# Patient Record
Sex: Female | Born: 1935
Health system: Southern US, Community
[De-identification: ages and names within clinical notes are randomized; demographics above are authoritative.]

## PROBLEM LIST (undated history)

## (undated) DIAGNOSIS — M1990 Unspecified osteoarthritis, unspecified site: Secondary | ICD-10-CM

## (undated) DIAGNOSIS — F0390 Unspecified dementia without behavioral disturbance: Secondary | ICD-10-CM

## (undated) DIAGNOSIS — I10 Essential (primary) hypertension: Secondary | ICD-10-CM

## (undated) DIAGNOSIS — I4891 Unspecified atrial fibrillation: Secondary | ICD-10-CM

## (undated) HISTORY — PX: KNEE ARTHROSCOPY: SHX127

## (undated) HISTORY — PX: BACK SURGERY: SHX140

---

## 1998-05-08 ENCOUNTER — Other Ambulatory Visit: Admission: RE | Admit: 1998-05-08 | Discharge: 1998-05-08 | Payer: Self-pay | Admitting: Obstetrics and Gynecology

## 1998-10-10 ENCOUNTER — Ambulatory Visit (HOSPITAL_BASED_OUTPATIENT_CLINIC_OR_DEPARTMENT_OTHER): Admission: RE | Admit: 1998-10-10 | Discharge: 1998-10-10 | Payer: Self-pay | Admitting: Plastic Surgery

## 1999-05-02 ENCOUNTER — Other Ambulatory Visit: Admission: RE | Admit: 1999-05-02 | Discharge: 1999-05-02 | Payer: Self-pay | Admitting: Obstetrics and Gynecology

## 1999-05-15 ENCOUNTER — Encounter: Payer: Self-pay | Admitting: Obstetrics and Gynecology

## 1999-05-15 ENCOUNTER — Encounter: Admission: RE | Admit: 1999-05-15 | Discharge: 1999-05-15 | Payer: Self-pay | Admitting: Obstetrics and Gynecology

## 1999-06-24 ENCOUNTER — Encounter (INDEPENDENT_AMBULATORY_CARE_PROVIDER_SITE_OTHER): Payer: Self-pay | Admitting: Specialist

## 1999-06-24 ENCOUNTER — Other Ambulatory Visit: Admission: RE | Admit: 1999-06-24 | Discharge: 1999-06-24 | Payer: Self-pay | Admitting: Obstetrics and Gynecology

## 2000-05-17 ENCOUNTER — Encounter: Payer: Self-pay | Admitting: Obstetrics and Gynecology

## 2000-05-17 ENCOUNTER — Encounter: Admission: RE | Admit: 2000-05-17 | Discharge: 2000-05-17 | Payer: Self-pay | Admitting: Obstetrics and Gynecology

## 2000-06-04 ENCOUNTER — Other Ambulatory Visit: Admission: RE | Admit: 2000-06-04 | Discharge: 2000-06-04 | Payer: Self-pay | Admitting: Obstetrics and Gynecology

## 2001-05-17 ENCOUNTER — Encounter: Payer: Self-pay | Admitting: Obstetrics and Gynecology

## 2001-05-17 ENCOUNTER — Encounter: Admission: RE | Admit: 2001-05-17 | Discharge: 2001-05-17 | Payer: Self-pay | Admitting: Obstetrics and Gynecology

## 2002-05-24 ENCOUNTER — Encounter: Payer: Self-pay | Admitting: Obstetrics and Gynecology

## 2002-05-24 ENCOUNTER — Encounter: Admission: RE | Admit: 2002-05-24 | Discharge: 2002-05-24 | Payer: Self-pay | Admitting: Obstetrics and Gynecology

## 2002-09-26 ENCOUNTER — Other Ambulatory Visit: Admission: RE | Admit: 2002-09-26 | Discharge: 2002-09-26 | Payer: Self-pay | Admitting: Obstetrics and Gynecology

## 2003-05-21 ENCOUNTER — Encounter: Admission: RE | Admit: 2003-05-21 | Discharge: 2003-05-21 | Payer: Self-pay | Admitting: Obstetrics and Gynecology

## 2003-11-21 ENCOUNTER — Encounter: Admission: RE | Admit: 2003-11-21 | Discharge: 2003-11-21 | Payer: Self-pay | Admitting: Family Medicine

## 2003-12-27 ENCOUNTER — Encounter: Admission: RE | Admit: 2003-12-27 | Discharge: 2003-12-27 | Payer: Self-pay | Admitting: Family Medicine

## 2004-04-14 ENCOUNTER — Inpatient Hospital Stay (HOSPITAL_COMMUNITY): Admission: RE | Admit: 2004-04-14 | Discharge: 2004-04-21 | Payer: Self-pay | Admitting: Neurosurgery

## 2004-04-21 ENCOUNTER — Ambulatory Visit: Payer: Self-pay | Admitting: Physical Medicine & Rehabilitation

## 2004-04-21 ENCOUNTER — Inpatient Hospital Stay (HOSPITAL_COMMUNITY)
Admission: RE | Admit: 2004-04-21 | Discharge: 2004-04-24 | Payer: Self-pay | Admitting: Physical Medicine & Rehabilitation

## 2004-05-01 ENCOUNTER — Encounter
Admission: RE | Admit: 2004-05-01 | Discharge: 2004-07-30 | Payer: Self-pay | Admitting: Physical Medicine & Rehabilitation

## 2004-05-12 ENCOUNTER — Emergency Department (HOSPITAL_COMMUNITY): Admission: EM | Admit: 2004-05-12 | Discharge: 2004-05-12 | Payer: Self-pay | Admitting: Emergency Medicine

## 2004-06-03 ENCOUNTER — Ambulatory Visit (HOSPITAL_COMMUNITY): Admission: RE | Admit: 2004-06-03 | Discharge: 2004-06-03 | Payer: Self-pay | Admitting: Obstetrics and Gynecology

## 2004-10-06 ENCOUNTER — Other Ambulatory Visit: Admission: RE | Admit: 2004-10-06 | Discharge: 2004-10-06 | Payer: Self-pay | Admitting: Obstetrics and Gynecology

## 2005-06-18 ENCOUNTER — Ambulatory Visit (HOSPITAL_COMMUNITY): Admission: RE | Admit: 2005-06-18 | Discharge: 2005-06-18 | Payer: Self-pay | Admitting: Obstetrics and Gynecology

## 2006-06-21 ENCOUNTER — Ambulatory Visit (HOSPITAL_COMMUNITY): Admission: RE | Admit: 2006-06-21 | Discharge: 2006-06-21 | Payer: Self-pay | Admitting: Obstetrics and Gynecology

## 2006-11-23 ENCOUNTER — Encounter: Admission: RE | Admit: 2006-11-23 | Discharge: 2006-11-23 | Payer: Self-pay | Admitting: Family Medicine

## 2007-06-15 ENCOUNTER — Ambulatory Visit: Payer: Self-pay | Admitting: Vascular Surgery

## 2007-06-15 ENCOUNTER — Ambulatory Visit (HOSPITAL_COMMUNITY): Admission: RE | Admit: 2007-06-15 | Discharge: 2007-06-15 | Payer: Self-pay | Admitting: Family Medicine

## 2007-06-15 ENCOUNTER — Encounter (INDEPENDENT_AMBULATORY_CARE_PROVIDER_SITE_OTHER): Payer: Self-pay | Admitting: Family Medicine

## 2007-06-24 ENCOUNTER — Ambulatory Visit (HOSPITAL_COMMUNITY): Admission: RE | Admit: 2007-06-24 | Discharge: 2007-06-24 | Payer: Self-pay | Admitting: Obstetrics and Gynecology

## 2007-10-21 ENCOUNTER — Inpatient Hospital Stay (HOSPITAL_COMMUNITY): Admission: RE | Admit: 2007-10-21 | Discharge: 2007-10-25 | Payer: Self-pay | Admitting: Orthopedic Surgery

## 2008-06-26 ENCOUNTER — Ambulatory Visit (HOSPITAL_COMMUNITY): Admission: RE | Admit: 2008-06-26 | Discharge: 2008-06-26 | Payer: Self-pay | Admitting: Obstetrics and Gynecology

## 2009-07-10 ENCOUNTER — Ambulatory Visit (HOSPITAL_COMMUNITY): Admission: RE | Admit: 2009-07-10 | Discharge: 2009-07-10 | Payer: Self-pay | Admitting: Obstetrics and Gynecology

## 2010-01-08 ENCOUNTER — Encounter: Admission: RE | Admit: 2010-01-08 | Discharge: 2010-01-08 | Payer: Self-pay | Admitting: Family Medicine

## 2010-10-14 NOTE — Op Note (Signed)
Katherine Miranda, Katherine Miranda              ACCOUNT NO.:  [id number]   MEDICAL RECORD NO.:  [id number]          PATIENT TYPE:  INP   LOCATION:  0002                         FACILITY:  [facility]   PHYSICIAN:  Georges Lynch. Gioffre, M.D.DATE OF BIRTH:  1920-12-23   DATE OF PROCEDURE:  10/21/2007  DATE OF DISCHARGE:                               OPERATIVE REPORT   PREOPERATIVE DIAGNOSIS:  Severe degenerative arthritis of the right  knee.   POSTOPERATIVE DIAGNOSIS:  Severe degenerative arthritis of the right  knee.   OPERATION:  Right total knee arthroplasty utilizing the DePuy system.   SURGEON:  Georges Lynch. Darrelyn Hillock, M.D.   ASSISTANT:  Madlyn Frankel. Charlann Boxer, M.D.,   SIZES USED:  The size 3 right femoral component cemented.  The size of  the tibial tray was a 2.5 mm.  The insert was a 3 mm 10 mm thickness  rotating platform insert.  I used vancomycin in the cement.  All three  components were cemented.  The size of the patella was a size 35  patella.   PROCEDURE IN DETAIL:  Under general anesthesia, routine orthopedic prep  and drape of the right lower extremity was carried out.  She had 1 gram  of IV Ancef preop.  At this time her leg was exsanguinated with an  Esmarch and tourniquet was elevated to 350 mmHg.  Following that an  incision was made in the midline with the knee flexed.  At that time I  carried out a median parapatellar incision reflecting the patella  laterally.  I flexed the knee and did medial and lateral meniscectomies  and excised the anterior and posterior cruciate ligaments.  I also did a  synovectomy.  Initial drill holes were made in the intercondylar notch  with a drill.  Following that the #1 jig was then inserted.  An 11 mm  thickness was removed from the distal femur.  Then we measured the femur  prior to that to be or following that to be a size 3.  We carried out  our anterior and posterior chamfer cuts for size 3 right femoral  component.  Following that we then prepared the  tibia in the usual  fashion.  We measured the tibia to be a size 2.5 diameter.  We made our  initial drill hole in the tibia followed by our keel cut.  After that  was carried out we then cut our notch cut out of the distal femur in the  usual fashion.  We then inserted our trial components and went through  trials and we selected a 10 mm thickness tibial insert which fit quite  nicely.  We had good medial and lateral stability, good flexion and  extension.  There were no obvious large spurs present posteriorly.  Following that we prepared our patella.  I removed the appropriate  amount of bone off of the patella for resurfacing the patella.  We  measured the patella to be a size 35 mm.  Three drill holes were made in  the patella.  Following that we then removed all trial components,  thoroughly irrigated  out the knee and cemented all three components in  simultaneously.  Once the cement was hardened we looked for loose pieces  of cement and removed those.  We had an excellent fit with a 10 mm  thickness insert.  We removed our trial insert, irrigated the knee out  again and searched for cement once again, made sure there were no loose  pieces of cement present.  Following that we inserted our permanent  rotating platform, reduced the knee, took the knee through motion and it  had good stability.  We then closed the knee in layers over a Hemovac  drain.  Prior to inserting the prosthesis I neglected to mention above  we  did inject about 20 mL of one-quarter percent Marcaine with epinephrine  and Toradol into the soft tissue.  After the wound was closed sterile  Neosporin dressings were applied.  The patient left the operating room  in satisfactory condition.           ______________________________  Georges Lynch Darrelyn Hillock, M.D.     RAG/MEDQ  D:  10/21/2007  T:  10/21/2007  Job:  [id number]   cc:   Donia Guiles, M.D.  Fax: [phone]

## 2010-10-14 NOTE — H&P (Signed)
Katherine Miranda, MIRANDA              ACCOUNT NO.:  [id number]   MEDICAL RECORD NO.:  [id number]          PATIENT TYPE:  INP   LOCATION:  NA                           FACILITY:  [facility]   PHYSICIAN:  Georges Lynch. Gioffre, M.D.DATE OF BIRTH:  03/30/1921   DATE OF ADMISSION:  10/21/2007  DATE OF DISCHARGE:                              HISTORY & PHYSICAL   CHIEF COMPLAINT:  Painful loss of range of motion right knee.   HISTORY OF PRESENT ILLNESS:  The patient is an 77 year old female here  today for history and physical examination for her upcoming right total  knee arthroplasty by Dr. Darrelyn Hillock at [facility] on Oct 21, 2007.  The patient has been having progressively worsening pain with of  range of motion of the knee.  She has a difficult time ambulating.  She  is currently getting around in a wheelchair.  X-rays show that she has  severe osteoarthritis of the knee which is also a concern with  arthroscopic procedure.  She has a bone on bone medial compartment of  the knee.   ALLERGIES:  No known drug allergies but did have some nausea with  PERCOCET 10/650.   PRIMARY CARE PHYSICIAN:  Donia Guiles, M.D.   CURRENT MEDICATIONS:  1. Travatan eye drops one drop both eyes nightly.  2. Timolol eye drops one drop right eye in the A.M.  3. Fish oil 1,000 mg a day.  4. Cranberry pill 500 mg once a day.  5. Hydroxyl 25 mg once a day.  6. Vitamin D 2,000 international units once a day.  7. Co-enzyme Q10 5 mg once a day.  8. Multivitamin once a day.  9. Avalide 300/25 mg once a day.  10.Norvasc 10 mg once a day.  11.Benazepril 40 mg once a day.  12.Aspirin 325 mg once a day.   PAST MEDICAL HISTORY:  Includes:  1. Hypertension.  2. Questionable TIA, June, 2008 with a visual defect, resolved without      recurrence.  3. Glaucoma.  4. Previous cardiac murmur.  5. Severe osteoarthritis right knee, medial compartment, bone on bone.   PAST SURGICAL HISTORY:  Includes spinal stenosis  in 2005 without any  complications.   REVIEW OF SYSTEMS:  PULMONARY:  Is negative for any respiratory issues.  NEUROLOGICAL:  Only neurological issue is related to a questionable TIA  for visual defects back in June, 16109, which completely resolved and  had a negative work up.  CARDIAC:  She does have some hypertension  issues.  She has no other cardiac issues related to heart attacks,  cardiac work up or catheterizations.  No shortness of breath or chest  discomfort or irregular heart rates.  GI:  Positive for recent diarrhea  related to eating an unusual food but nothing consistent, improved with  Imodium.  GU:  Unremarkable.  ENDOCRINE:  Unremarkable.  HEMATOLOGIC:  Unremarkable.   FAMILY MEDICAL HISTORY:  Father is deceased from heart disease at the  age of 34.  Mother is deceased of unknown causes with a history of  hypertension at 28.   SOCIAL  HISTORY:  The patient is a widow.  She has never smoked.  No  alcohol or drug issues.  Three grown children.  She lives alone in a one  story house.  Family will be providing care postoperatively.   PHYSICAL EXAMINATION:  VITAL SIGNS:  Height is 5 feet 4 inches.  Weight  is 140 pounds.  Blood pressure was 142/78. Pulse of 74 and regular.  Respirations are 12 and nonlabored.  The patient is afebrile.  GENERAL:  This is a thin, frail female, conscious, alert and  appropriate.  Appears to be a good historian.  Moves around currently in  a wheelchair due to difficulty with range of motion and pain in her  right lower extremity.  HEENT:  Head was normocephalic. Pupils equal, round, reactive.  Extraocular movements intact.  Gross hearing is intact.  NECK:  Was supple.  No palpable lymphadenopathy.  Good range of motion  without any discomfort.  CHEST:  Lung sound are clear and equal bilaterally.  HEART:  Regular rate and rhythm with no murmurs.  ABDOMEN:  Soft, nontender.  Bowel sounds present.  EXTREMITIES:  Upper extremities had excellent  range of motion of  shoulders, elbows and wrists.  Motor strength was 5/5.  Lower  extremities right and left hip had full extension, flexion up to 130  degrees with 20 to 30 degrees internal and external rotation without any  discomfort.  Left knee had full extension, flexion to 130, no  instability.  No effusion.  No signs of erythema.  She had soft calf,  good motion of her left ankle.  Right knee lacked about 10 degrees of  extension.  She was able to flex it back to about 110 degrees.  She had  no effusion, no signs of infection about the knee.  Her calf was soft  and nontender.  She had good motion of her right ankle.   Peripheral vascular carotid pulses were 2+ and vigorous.  Radial pulses  were 2+.  Posterior tibial and dorsalis pedis pulses were 1+  She had  significant varicosities in the lower extremities.  NEUROLOGICAL:  Patient was conscious, alert and appropriate.  She had no  gross neurological defects noted.  BREAST/RECTAL and GENITOURINARY:  Exams were deferred at this time.   IMPRESSION:  1. End-stage osteoarthritis with loss of range of motion and painful      weight-bearing right knee.  2. Hypertension.  3. Glaucoma.  4. Questionable transient ischemic attack with visual defects in 2008.  5. Reported cardiac murmur.  6. Glaucoma.   PLAN:  The patient will undergo all routine labs and tests prior to  having a right total knee arthroplasty by Dr. Darrelyn Hillock at [facility] on Oct 21, 2007.  The patient has been medically cleared by Dr.  Donia Guiles.      Jamelle Rushing, P.A.    ______________________________  Georges Lynch Darrelyn Hillock, M.D.    RWK/MEDQ  D:  10/18/2007  T:  10/18/2007  Job:  [id number]   cc:   Donia Guiles, M.D.  Fax: [phone]

## 2010-10-17 NOTE — Consult Note (Signed)
Katherine Miranda, STELLY              ACCOUNT NO.:  [id number]   MEDICAL RECORD NO.:  [id number]          PATIENT TYPE:  INP   LOCATION:  3028                         FACILITY:  [facility]   PHYSICIAN:  Jackie Plum, M.D.DATE OF BIRTH:  Nov 27, 1920   DATE OF CONSULTATION:  04/18/2004  DATE OF DISCHARGE:                                   CONSULTATION   REQUESTING PHYSICIAN:  Dr. Cristi Loron of neurosurgery.   REASON FOR CONSULTATION:  Management of hyponatremia.   IMPRESSION:  Hyponatremia with hypokalemia with a patient recently on  diuretic.  Etiology of patient's hyponatremia may be diuretic-related.  Another possibility could be sheer pain with secondary syndrome of  inappropriate antidiuretic hormone.   RECOMMENDATIONS:  I would like to give the patient a trial of saline IV for  depletion of her potassium, monitor her sodium and potassium.  She may be  resistant to improvement with high-potassium foods, therefore hyponatremia  workup with the urine and blood studies would be instituted.   COMMENTS:  Ms. Katherine Miranda was seen, evaluated after interviewing and all her  records were reviewed.  The patient is a pleasant 77 year old lady with  history of hypertension, glaucoma and arthritis, who was admitted to the  neurosurgical service for bilateral laminectomies to treat her spinal  stenosis plus DJD.  The patient, postop, had been noted to have a drifting  down of her sodium incidentally down to levels in the 120s, whereupon her  hydrochlorothiazide was discontinued on April 16, 2004 by her attending  physician, Dr. Lovell Sheehan.  However, her sodium drifted further down and  hospitalists' service was asked to evaluate for management of her  hyponatremia.  The patient indicates that she has never had any problems  with hyponatremia for which she can remember.  She feels mildly weak without  nausea, vomiting or headaches.  No history of chest pain, palpitations or  shortness of  breath.  She denies any history of dysuria or frequency of  micturition.  The patient had been having a lot of pain after her surgery.  She is receiving oxycodone postop with p.r.n. morphine sulfate, which she  has not been calling for frequently as needed for pain control.   PAST MEDICAL HISTORY:  1.  History of glaucoma.  2.  Hypertension.  3.  DJD, as mentioned above.   SOCIAL HISTORY:  The patient does not smoke cigarettes or drink alcohol.   CURRENT MEDICATIONS:  Her current medication list in the hospital was  reviewed and her ACE inhibitor has been substituted with Avapro, and the  Norvasc has been discontinued.   PHYSICAL EXAMINATION:  VITAL SIGNS:  BP was 168/84, pulse of 83,  respirations 17.  She was afebrile.  Her room air O2 SAT was 93%.  GENERAL:  During this admission, she was not in acute cardiopulmonary  distress, however, she was in mild-to-moderate painful distress.  HEENT:  Normocephalic, atraumatic.  Pupils were equal, round and reactive to  light.  Extraocular movements were intact.  Oropharynx was moist without any  exudation or erythema.  She had a very mild conjunctival pallor without  icterus.  NECK:  Neck was supple with no JVD.  LUNGS:  Lungs were clear to auscultation.  CARDIAC:  She had a regular rate and rhythm on cardiac auscultation.  ABDOMEN:  Abdomen was soft and nontender.  EXTREMITIES:  She did not have any edema or cyanosis on extremity exam.  NEUROLOGIC:  She is alert and oriented x3 with no acute focal deficits.   LABORATORY WORK:  Her lab work was reviewed.  Her sodium was 123 with a  potassium of 3.1 and chloride of 86, CO2 30, glucose 110, BUN was 5,  creatinine 0.6, calcium 8.3.  Her previous hemoglobin was 10.7, white blood  cell count 8.1, platelet count was 224,000, MCV 81.5, hematocrit was 30.6.   ASSESSMENT:  Katherine Miranda has significant hyponatremia without any overt  systemic symptoms.  She is hemodynamically stable.  She does not  have any  significant cardiac symptomatology.  The etiology of the patient's  hyponatremia most likely is related to her diuretics with concurrent  hypokalemia, however, sheer pain could be correlated for syndrome of  inappropriate antidiuretic hormone which would result in her presentation.  We will not engage our service in a full-scale hyponatremia workup, but I  would continue to hold the diuretic and give saline infusion and monitor her  metabolic panel.  Should there be no improvement, as mentioned above, we  will then engage in large-scale workup.  Her x-ray done on April 10, 2004  was not significant for an acute infiltrate or any pulmonary process.   Thank you for this consultation.  Dr. Lendell Caprice will be following up with  you.      Geor   GO/MEDQ  D:  04/18/2004  T:  04/18/2004  Job:  [id number]   cc:   Cristi Loron, M.D.  [address]  [place]  [place] [address]  Fax: [phone]   Donia Guiles, M.D.  [address] [place]  [place] [address]  Fax: [phone]   L. Lupe Carney, M.D.  [address] [place]  [place] [address]  Fax: [phone]

## 2010-10-17 NOTE — Discharge Summary (Signed)
[name], Katherine Miranda NO.:  [id number]   MEDICAL RECORD NO.:  [id number]          PATIENT TYPE:  INP   LOCATION:  3028                         FACILITY:  [facility]   PHYSICIAN:  Cristi Loron, M.D.DATE OF BIRTH:  May 16, 1921   DATE OF ADMISSION:  04/14/2004  DATE OF DISCHARGE:  04/21/2004                                 DISCHARGE SUMMARY   BRIEF HISTORY:  The patient is an 77 year old white female who suffers from  back and leg pain consistent with neurogenic claudication.  She has failed  medical management and was worked up with a lumbar MRI which demonstrated  the patient had multilevel degenerative changes, but significantly had a  grade 1 acquired spondylolisthesis at L4-5 with resultant severe spinal  stenosis as well as stenosis at L3-4 as well. I discussed the various  treatment options with the patient and her family including surgery.  The  patient weighed the risks, benefits and alternatives of surgery and decided  to proceed with an L4-5 fusion as well as an L3-4 decompression.   For further details of this admission, please refer to the typed history and  physical.   HOSPITAL COURSE:  I admitted the patient to [facility] on April 14, 2004.  On the day of admission I performed an L3 laminectomy with L4-5  fusion.  The surgery went well.  For full details of this operation, please  refer to the typed operative note.   The patient's postoperative course was remarkable only for hyponatremia. Her  sodium progressively dropped from 133 to 129 to 126.  I therefore obtained a  hospitalist consult from Dr. Jackie Plum and he helped [place] manage her  hyponatremia.   We also asked the PT, OT and the rehab team to see the patient.  They felt  her to be appropriate for inpatient rehabilitation and by April 21, 2004  the patient was afebrile and vital signs were stable. She was eating well  and ambulating well. Her wound was healing well  without signs of infection.  She was felt to be stable to transfer to the inpatient rehab unit and was  transferred on April 21, 2004.   DISCHARGE INSTRUCTIONS:  The patient was instructed to follow-up with me  four weeks after discharge from the rehab unit.   FINAL DIAGNOSES:  L3-4 spinal stenosis and degenerative joint disease, L4-5  degenerative joint disease, spondylolisthesis, spinal stenosis, lumbago and  lumbar radiculopathy.   PROCEDURES:  L4 Gill procedure; bilateral L3 laminotomies that is to treat  the spinal stenosis at L3-4; L4-5 posterior lumbar interbody fusion;  placement of bilateral L4-5 interbody prosthesis using ([place]  cages); L4-5 posterior segmental instrumentation with Legacy type using  pedicle screws and rods; L4-5 posterolateral arthrodesis with local  morselized autograft bone and VITOSS bone scaffolding.    JDJ/MEDQ  D:  06/12/2004  T:  06/12/2004  Job:  [id number]

## 2010-10-17 NOTE — Discharge Summary (Signed)
Katherine Miranda, Katherine Miranda              ACCOUNT NO.:  [id number]   MEDICAL RECORD NO.:  [id number]          PATIENT TYPE:  IPS   LOCATION:  4010                         FACILITY:  [facility]   PHYSICIAN:  Ellwood Dense, M.D.   DATE OF BIRTH:  [birth year]   DATE OF ADMISSION:  04/21/2004  DATE OF DISCHARGE:  04/24/2004                                 DISCHARGE SUMMARY   DISCHARGE DIAGNOSES:  1.  Lumbar vertebrae-3/4 Gill procedure.  2.  Bilateral lumbar vertebrae-3 laminectomies.  3.  Lumbar vertebrae-4/5 posterior lumbar interbody fusion secondary to      lumbar stenosis and radiculopathy.  4.  History of hypertension.  5.  Urinary tract infection.  6.  Mild hyponatremia.  7.  History of glaucoma.   HISTORY OF PRESENT ILLNESS:  The patient is an 77 year old white female with  a history of hypertension, back pain radiating to lower extremities with  neurogenic claudication secondary to L3/4 stenosis and radiculopathy, and  elected to undergo an L4 Gill procedure, bilateral L3 laminotomies, L4/5  PLIF on April 14, 2004 by Dr. Cristi Loron.  Presently, no DVT  prophylaxis.   On PT report this time indicates that the patient is ambulating with minimal  guarded assist 100 feet with rolling walker, transfer with minimal guarded  assist.   Hospital course was significant for spasms, hyponatremia, anemia, and  constipation.  Once the patient was medically stable, the patient was  transferred to the [facility] Department on  April 21, 2004.   REVIEW OF SYMPTOMS:  Significant for weakness and lumbago.   PAST MEDICAL HISTORY:  1.  Significant for hypertension.  2.  Glaucoma.  3.  Heart murmur.   FAMILY HISTORY:  Noncontributory.   SOCIAL HISTORY:  The patient lives with husband in one level home in  [place], [place].  There are four steps to entry.  She was  independent prior to admission.  No tobacco.  No alcohol.  Husband can  assist.  She has three adult children.   MEDICATIONS:  1.  Fosinopril 40 mg daily.  2.  Aspirin 81 mg daily.  3.  Avalide 300/12.5 mg daily.  4.  Norvasc 10 mg daily.  5.  Timoptic and Travatan.   HOSPITAL COURSE:  Katherine Miranda was admitted to [facility] Department on April 21, 2004 for comprehensive patient  rehabilitation.  She received more than three hours of therapy daily.  Overall, Katherine Miranda progressed very well.  She states she was very well.  Demonstrated no signs of infection.  There was no significant drainage or  erythema around the incision at the time of discharge.  The pain will be  controlled initially on oxycodone.  This had to be discontinued due to  sedation.  Presently, pain is being controlled with Ultram q.6h.   The patient is discharged in modified independent level.  The patient able  to ambulate independently.  Able to perform most ADLs independently at  supervision level.  Blood pressure was in reasonably fair control while in  rehabilitation.  She remained on Avapro  300 mg and Norvasc 10 mg daily.  HCTZ was resumed on April 23, 2004 at 12.5 mg daily.  Hyponatremia was  stable with sodium level of 134.   The patient did develop a urinary tract infection while on rehabilitation.  Urine cultures were performed on April 21, 2004 which were greater than  100,000 colonies of E. coli.  The patient was started on amoxicillin 250 mg  p.o. t.i.d. for a total of seven days.  There are no major issues occurred  while on rehabilitation.   FOLLOWUP:  The patient is to follow up with her primary care Adaia Matthies due to  systolic blood pressures in the 150s while in rehabilitation.   LABORATORY DATA:  Latest labs indicated sodium of 134, potassium 3.8,  chloride 97, CO2 30, BUN 18, creatinine 0.8.  Hemoglobin 11.1, hematocrit  32.4, white blood count of 6.6, platelet count 369,000.   DISPOSITION:  PT report at the time of discharge states  the patient is able  to ambulate 250 feet with a rolling walker, modified activity, transfer sit  to stand modified independent, bed mobility modified independently.  Can  perform all ADLs with modified independently with some supervision level in  the shower.  At the time of discharge, all vitals were stable.  Physical  exam unremarkable.   The patient was discharged home with her family.   DISCHARGE MEDICATIONS:  1.  Resume Avalide home dose.  2.  Norvasc 10 mg daily.  3.  Aspirin 81 mg daily.  4.  Resume her eye drops, Timoptic and Travatan.  5.  Fosinopril 40 mg daily.  6.  Ultram q.6-8h. p.r.n. 50 to 100 mg.  7.  Amoxicillin 250 mg 1 tablet q.6h. or 3 times daily for 4 more days.  8.  Ultram and Tylenol p.r.n. back pain.   ACTIVITY:  We have gave precautions.  Use back brace for sitting and  standing.  Use walker.  No driving until follow up with Dr. Cristi Loron.   FOLLOWUP:  She had an outpatient rehabilitation at [facility] with PT and OT beginning on May 01, 2004 at 8:15.  Follow up  with Dr. Cristi Loron in two weeks and call for an appointment.  Follow up with Dr. Ellwood Dense as needed.  She will need to follow up  with Dr. Donia Guiles in three to four weeks to check the blood pressure.       LB/MEDQ  D:  04/24/2004  T:  04/24/2004  Job:  [id number]   cc:   Ellwood Dense, M.D.  [address] [place]  [place] [address]  Fax: [phone]   Cristi Loron, M.D.  [address]  [place]  [place] [address]  Fax: [phone]   Donia Guiles, M.D.  [address] [place]  [place] [address]  Fax: [phone]

## 2010-10-17 NOTE — Op Note (Signed)
NAMEADISYNN, SULEIMAN              ACCOUNT NO.:  [id number]   MEDICAL RECORD NO.:  [id number]          PATIENT TYPE:  INP   LOCATION:  3028                         FACILITY:  [facility]   PHYSICIAN:  Cristi Loron, M.D.DATE OF BIRTH:  05/10/1921   DATE OF PROCEDURE:  04/14/2004  DATE OF DISCHARGE:                                 OPERATIVE REPORT   PREOPERATIVE DIAGNOSIS:  L3-4 spinal stenosis, degenerative disk disease; L4-  5 degenerative disk disease, spondylolisthesis, spinal stenosis, lumbago and  lumbar radiculopathy.   POSTOPERATIVE DIAGNOSIS:  L3-4 spinal stenosis, degenerative disk disease;  L4-5 degenerative disk disease, spondylolisthesis, spinal stenosis, lumbago  and lumbar radiculopathy.   OPERATION PERFORMED:  L4 Gill procedure; bilateral L3 laminotomies (to treat  the spinal stenosis at L3-4); L4-5 posterior lumbar interbody fusion;  placement of bilateral L4-5 interbody prosthesis (Capstone PEAK cages); L4-5  posterior segmental instrumentation with Legacy titanium pedicle screws and  rods; L4-5 posterolateral arthrodesis with local morselized autograft bone  and Vitoss bone scaffolding.   SURGEON:  Cristi Loron, M.D.   ASSISTANT:  Stefani Dama, M.D.   ANESTHESIA:  General endotracheal.   ESTIMATED BLOOD LOSS:  250 mL.   SPECIMENS:  None.   DRAINS:  None.   COMPLICATIONS:  None.   INDICATIONS FOR PROCEDURE:  The patient is an 77 year old white female who  has suffered from back and leg pain consistent with neurogenic claudication.  She failed medical management and was worked up with a lumbar MRI which  demonstrated the patient had multilevel degenerative changes but most  significantly had a grade 1 acquired spondylolisthesis at L4-5 with  resultant severe spinal stenosis as well as __________ spinal stenosis 3-4.  I discussed the various treatment options with the patient and her family  including surgery.  The patient weighed the risks,  benefits and alternatives  to surgery and decided to proceed with an L3-4 decompression and L4-5  fusion.   DESCRIPTION OF PROCEDURE:  The patient was brought to the operating room by  the anesthesia team.  General endotracheal anesthesia was induced.  The  patient was then turned to the prone position on the Wilson frame.  Her  lumbosacral region was then prepared with Betadine scrub and Betadine  solution and sterile drapes were applied.  I then injected the area to be  incised with Marcaine with epinephrine solution.  I used a scalpel to make a  linear midline incision over the L3-4 and L4-5 interspaces.  I used  electrocautery to perform a bilateral subperiosteal dissection stripping  bilateral spinous process and lamina of L3, L4 and L5.  I inserted the  Versatrac retractor for exposure and then obtained intraoperative radiograph  to confirm our location.   We began by incising the interspinous ligament at L3-4 and L4-5.  We then  used a Leksell rongeur to remove the L4 spinous process and part of the L4  lamina.  We saved this bone and later cleared it of soft tissues and used it  in the fusion process.  We then used a high speed drill to perform a  bilateral  L4 and L3 laminotomies.  We then widened the laminotomies at L3  using Kerrison punch to remove the L3-4 ligamentum flavum completing the  decompression at this level and performing foraminotomies about the  bilateral L4 nerve roots.  We then completed the Gill procedure at L4 by  using the Kerrison punch removing the remainder of the L4 lamina, the medial  aspect of the pars region and performed foraminotomies about the bilateral  L5 nerve roots.  There was severe stenosis at this level.  This completed  the decompression.   We now turned our attention to the posterior lumbar interbody fusion.  We  carefully retracted the thecal sac and the L5 nerve root medially with the  D'Errico retractor exposing the L4-5 intervertebral  disk.  We incised the  disk with a 15 blade scalpel and performed aggressive diskectomy using  pituitary forceps.  We did this bilaterally.  We then inserted the interbody  spreader into the contralateral disk space and then distracted the L4-5  interspace and then cleared the remainder of the soft tissue from the  vertebral end plates Z6-1 on the ipsilateral side and then inserted an 8 x  22 mm Capstone PEAK cage which had been prefilled with local morcellized  autograft bone and Vitoss bone scaffolding.  We then removed the spreader  from the contralateral side, cleared the soft tissue from the vertebral end  plates at that side and placed a second 10 x 22 mm  Capstone PEAK cage on  the left side (the extent of disk space collapse was different on either  side.  We then filled in between the cages with local morcellized autograft  bone and Vitoss bone scaffolding completing the posterior lumbar interbody  fusion.   We turned our attention to the instrumentation by using electrocautery to  expose the bilateral transverse processes of L4 and L5.  Then under  fluoroscopic guidance, we cannulated the bilateral L4 and L5 pedicles,  tapped the pedicles, probed inside the tapped pedicles to rule out cortical  breaches and inserted 6-5 x 45 mm pedicle screws bilaterally at L5 and 6.5 x  50 mm pedicle screws bilaterally at L4.  We then probed along the medial  aspect of the bilateral L4 and L5 pedicles and noted that there were no  cortical breeches and that the L4 and L5 nerve roots were not injured.  We  then connected unilateral pedicle screws with a lordotic rod.  We compressed  the construct and then secured the rods in place with the caps which we  tightened appropriately, completing the instrumentation.   We now turned our attention to posterior lumbar interbody fusion.  We used the high speed drill to decorticate the remainder of the L4-5 facet joint,  the pars region, the bilateral  transverse processes of L4 and L5.  We then  placed a combination of local morcellized autograft bone and Vitoss bone  scaffolding over these decorticated posterolateral structures completing a  posterolateral arthrodesis.   We then inspected the thecal sac and bilateral L4 and L5 nerve roots and  noted that they were all well decompressed as was the thecal sac from L3  down to the top of  L5. We then obtained stringent hemostasis using bipolar  electrocautery, removed the Versatrac retractor and then reapproximated the  patient's thoracolumbar fascia with interrupted #1 Vicryl suture,  subcutaneous tissue with interrupted 2-0 Vicryl suture and the skin with  Steri-Strips and Benzoin.  The wound was then coated  with bacitracin  ointment, sterile dressing was applied, the drapes were removed.  The  patient was subsequently returned to supine position where she was extubated  by the anesthesia team and transported to the post anesthesia care unit in  stable condition.  All sponge, needle and instrument counts were correct at  the end of the case.     JDJ/MEDQ  D:  04/14/2004  T:  04/14/2004  Job:  [id number]

## 2010-10-17 NOTE — Discharge Summary (Signed)
NAMEFRANSHESKA, Katherine Miranda              ACCOUNT NO.:  [id number]   MEDICAL RECORD NO.:  [id number]          PATIENT TYPE:  INP   LOCATION:  1528                         [facility]ACILITY[facility]   PHYSICIAN:  Georges Lynch. Gioffre, M.D.DATE OF BIRTH:  07-22-1920   DATE OF ADMISSION:  10/21/2007  DATE OF DISCHARGE:  10/25/2007                               DISCHARGE SUMMARY   ADMISSION DIAGNOSES:  1. End-stage osteoarthritis right knee.  2. Hypertension.  3. Glaucoma.  4. Questionable transient ischemic attack with visual defects in 2008.  5. Reported cardiac murmur.   DISCHARGE DIAGNOSES:  1. Right total knee arthroplasty.  2. Postoperative blood loss anemia, asymptomatic, not requiring      transfusion.  3. History of hypertension.  4. History of glaucoma.  5. History of questionable transient ischemic attack with visual      defects in 2008.  6. History of cardiac murmur.   HISTORY OF PRESENT ILLNESS:  The patient is an 77 year old female with  long-term problems related to her right knee.  The patient has been  treated conservatively.  The patient failed conservative treatment with  injections.  The patient states that she has to get around with a  wheelchair due to pain and difficulty with motion of her right knee.  X-  rays show that she has bone-on-bone medial compartment.  The patient has  elected to proceed with a total knee arthroplasty.   ALLERGIES:  No known drug allergies, but nausea with Percocet 10/650.   MEDICATIONS ON ADMISSION:  1. Travatan eyedrops 1 drop both eyes nightly.  2. Timolol eyedrops 1 drop right eye in the a.m.  3. Fish oil 1000 mg a day.  4. Cranberry pill 500 mg once a day.  5. Hydroxyl 25 mg once a day.  6. Vitamin D 2000 international units once a day.  7. Coenzyme Q10 5 mg once a day.  8. Multivitamin once a day.  9. Avalide 300/25 mg once a day.  10.Norvasc 10 mg once a day.  11.Benazepril 40 mg once a day.  12.Aspirin 325 mg once a day.   SURGICAL  PROCEDURE:  On Oct 21, 2007, the patient was taken to the OR by  Dr. Worthy Rancher, assisted by Dr. Lajoyce Corners.  Under general anesthesia  the patient underwent a right total knee arthroplasty with a DePuy right  rotating platform system.  The patient had minimal blood loss.  No  complications.  The patient was transferred to the recovery room and  then to the orthopedic floor in good condition.  The patient had the  following components implanted:  A size 3 Sigma posterior stabilizing  femoral component, a size 2.5 keeled tibial tray, a size 35 mm three-peg  patella, a size 3 10-mm thickness polyethylene component.  All  components were implanted with polymethyl methacrylate with vancomycin  mixed in.   CONSULTS:  The following routine consults were requested:  Physical  therapy, case management and pharmacy.   HOSPITAL COURSE:  The patient was admitted to [facility] on  Oct 21, 2007.  The patient was taken to the  OR where a right total knee  arthroplasty was performed without any complications.  The patient  tolerated the procedure well, was transferred to the recovery room with  IV antibiotics, pain medicines, on DVT prophylaxis.  The patient did  develop some postoperative hyponatremia in which this remained  asymptomatic.  She also developed some postoperative hypokalemia.  This  also remained asymptomatic but was not treated.  The patient's right  lower extremity, the wound remained benign for any signs of infection.  Her leg remained neuromotor vascularly intact.  The patient progressed  nicely with physical therapy.  The patient was able to wean off of IV  medications well without any problems.  She did have, once again,  problems with Percocet with nausea and she was converted over to  Vicodin.  The patient continued to work well with physical therapy.  The  patient was felt to be medically stable and ready for discharge home on  outpatient physical therapy and nurse to  monitor Coumadin.   LABORATORIES:  CBC on admission found WBC 7.6, hemoglobin 12.4,  hematocrit 36.9, platelets 282.  Discharge CBC was 8.8 with a 10.2  hemoglobin, a 29.7 hematocrit, and platelets 194.  INR at discharge was  1.4.  Routine chemistries on admission found sodium of 133, potassium  4.1, glucose 123, BUN 28, creatinine 1.17.  The patient's sodium was  labile between 129 but on discharge was 130.  Potassium dropped to 3.1,  glucose of 130, BUN 8, creatinine 0.67.  The patient's urinalysis  preoperatively did show she had large hemoglobin; 30 of protein; large  leukocyte esterase; too numerous to count WBCs with rare bacteria  cultured out to greater than 100,000 colonies.  She was placed on Cipro  preoperatively as an outpatient.   DISCHARGE INSTRUCTIONS:  By Dr. Darrelyn Hillock:  1. Diet:  No restrictions.  2. Activity:  The patient may increase her activity slowly, walk with      assistance with the use of a walker.  She may shower on discharge.  3. Wound care:  The patient is to change her dressing daily.   FOLLOW UP:  She needs a follow-up appointment 2 weeks from date of  discharge.  The patient is to call [phone] for that follow-up  appointment.   MEDICATIONS UPON DISCHARGE:  1. Coumadin 5 mg once a day unless changed by pharmacist.  2. Percocet 10/650 one tablet every 4-6 hours for pain if needed.  3. Robaxin 500 mg once every 6 hours for pain and muscle spasms if      needed.  4. Timolol eyedrops 1 drop in the morning.  5. Travatan 0.5% eyedrops 1 drop each eye nightly.  6. Iron capsule once a day.  7. Avalide 300/25 mg once a day.  8. Norvasc 10 mg once a day.  9. Benazepril 40 mg once a day.  10.Multivitamin once a day.  11.Fish oil 1000 units a day.  12.Aspirin to be hold put on hold until done with Coumadin.  13.Cranberry pill once a day.  14.Hydroxyl 25 mg nightly.  15.Vitamin D 2000 international units a day.  16.Coenzyme Q10 2 tablets a day.  17.Goji  juice once a day.   The patient's condition upon discharge to home is listed as improved and  good.      Jamelle Rushing, P.A.    ______________________________  Georges Lynch Darrelyn Hillock, M.D.    RWK/MEDQ  D:  11/14/2007  T:  11/14/2007  Job:  [id number]

## 2011-02-25 LAB — PROTIME-INR
INR: 1
INR: 1.1
INR: 1.4
Prothrombin Time: 13.2
Prothrombin Time: 15.6 — ABNORMAL HIGH
Prothrombin Time: 15.8 — ABNORMAL HIGH

## 2011-02-25 LAB — CBC
HCT: 30.2 — ABNORMAL LOW
HCT: 36.9
Hemoglobin: 10.4 — ABNORMAL LOW
Hemoglobin: 12.4
MCHC: 33.6
MCHC: 34.2
MCHC: 34.2
MCV: 89.2
MCV: 89.3
MCV: 90
Platelets: 189
Platelets: 194
Platelets: 198
Platelets: 282
RBC: 4.14
RDW: 13.3
RDW: 14.2
WBC: 7.6
WBC: 8.8

## 2011-02-25 LAB — HEMOGLOBIN AND HEMATOCRIT, BLOOD: Hemoglobin: 12

## 2011-02-25 LAB — DIFFERENTIAL
Basophils Absolute: 0
Basophils Relative: 0
Eosinophils Absolute: 0.1
Eosinophils Relative: 1
Lymphocytes Relative: 12
Lymphs Abs: 0.9
Monocytes Absolute: 0.7
Monocytes Relative: 9
Neutro Abs: 5.9
Neutrophils Relative %: 78 — ABNORMAL HIGH

## 2011-02-25 LAB — APTT: aPTT: 26

## 2011-02-25 LAB — URINE CULTURE
Colony Count: >=100000
Special Requests: NEGATIVE

## 2011-02-25 LAB — COMPREHENSIVE METABOLIC PANEL
ALT: 31
AST: 29
Albumin: 3.8
Alkaline Phosphatase: 124 — ABNORMAL HIGH
BUN: 28 — ABNORMAL HIGH
CO2: 29
Calcium: 9.7
Chloride: 92 — ABNORMAL LOW
Creatinine, Ser: 1.17
GFR calc Af Amer: 53 — ABNORMAL LOW
GFR calc non Af Amer: 44 — ABNORMAL LOW
Glucose, Bld: 123 — ABNORMAL HIGH
Potassium: 4.1
Sodium: 133 — ABNORMAL LOW
Total Bilirubin: 0.8
Total Protein: 7.6

## 2011-02-25 LAB — URINALYSIS, ROUTINE W REFLEX MICROSCOPIC
Bilirubin Urine: NEGATIVE
Glucose, UA: 100 — AB
Glucose, UA: NEGATIVE
Ketones, ur: NEGATIVE
Nitrite: NEGATIVE
Protein, ur: 30 — AB
Protein, ur: 30 — AB
Specific Gravity, Urine: 1.01
Specific Gravity, Urine: 1.017
Urobilinogen, UA: 0.2
pH: 6
pH: 6.5

## 2011-02-25 LAB — BASIC METABOLIC PANEL
BUN: 10
BUN: 18
BUN: 8
CO2: 27
CO2: 32
Calcium: 8.7
Calcium: 8.7
Chloride: 89 — ABNORMAL LOW
Chloride: 90 — ABNORMAL LOW
Creatinine, Ser: 0.67
Creatinine, Ser: 0.76
Glucose, Bld: 133 — ABNORMAL HIGH
Sodium: 129 — ABNORMAL LOW

## 2011-02-25 LAB — URINE MICROSCOPIC-ADD ON

## 2011-02-25 LAB — TYPE AND SCREEN
ABO/RH(D): A POS
Antibody Screen: NEGATIVE

## 2011-02-25 LAB — ABO/RH: ABO/RH(D): A POS

## 2011-06-03 DIAGNOSIS — H4011X0 Primary open-angle glaucoma, stage unspecified: Secondary | ICD-10-CM | POA: Diagnosis not present

## 2011-06-24 DIAGNOSIS — B351 Tinea unguium: Secondary | ICD-10-CM | POA: Diagnosis not present

## 2011-06-24 DIAGNOSIS — M79609 Pain in unspecified limb: Secondary | ICD-10-CM | POA: Diagnosis not present

## 2011-07-24 DIAGNOSIS — I10 Essential (primary) hypertension: Secondary | ICD-10-CM | POA: Diagnosis not present

## 2011-07-24 DIAGNOSIS — E782 Mixed hyperlipidemia: Secondary | ICD-10-CM | POA: Diagnosis not present

## 2011-07-24 DIAGNOSIS — R413 Other amnesia: Secondary | ICD-10-CM | POA: Diagnosis not present

## 2011-08-21 DIAGNOSIS — Z79899 Other long term (current) drug therapy: Secondary | ICD-10-CM | POA: Diagnosis not present

## 2011-08-25 DIAGNOSIS — H4011X0 Primary open-angle glaucoma, stage unspecified: Secondary | ICD-10-CM | POA: Diagnosis not present

## 2011-08-25 DIAGNOSIS — H2510 Age-related nuclear cataract, unspecified eye: Secondary | ICD-10-CM | POA: Diagnosis not present

## 2011-08-25 DIAGNOSIS — H409 Unspecified glaucoma: Secondary | ICD-10-CM | POA: Diagnosis not present

## 2011-09-11 DIAGNOSIS — Z79899 Other long term (current) drug therapy: Secondary | ICD-10-CM | POA: Diagnosis not present

## 2011-09-11 DIAGNOSIS — N179 Acute kidney failure, unspecified: Secondary | ICD-10-CM | POA: Diagnosis not present

## 2011-09-18 DIAGNOSIS — N179 Acute kidney failure, unspecified: Secondary | ICD-10-CM | POA: Diagnosis not present

## 2011-09-25 DIAGNOSIS — B351 Tinea unguium: Secondary | ICD-10-CM | POA: Diagnosis not present

## 2011-09-25 DIAGNOSIS — M79609 Pain in unspecified limb: Secondary | ICD-10-CM | POA: Diagnosis not present

## 2011-10-02 DIAGNOSIS — N179 Acute kidney failure, unspecified: Secondary | ICD-10-CM | POA: Diagnosis not present

## 2011-11-02 DIAGNOSIS — H409 Unspecified glaucoma: Secondary | ICD-10-CM | POA: Diagnosis not present

## 2011-11-02 DIAGNOSIS — H4011X0 Primary open-angle glaucoma, stage unspecified: Secondary | ICD-10-CM | POA: Diagnosis not present

## 2011-11-02 DIAGNOSIS — H2510 Age-related nuclear cataract, unspecified eye: Secondary | ICD-10-CM | POA: Diagnosis not present

## 2011-12-11 DIAGNOSIS — I498 Other specified cardiac arrhythmias: Secondary | ICD-10-CM | POA: Diagnosis not present

## 2011-12-11 DIAGNOSIS — I10 Essential (primary) hypertension: Secondary | ICD-10-CM | POA: Diagnosis not present

## 2011-12-25 DIAGNOSIS — B351 Tinea unguium: Secondary | ICD-10-CM | POA: Diagnosis not present

## 2011-12-25 DIAGNOSIS — M79609 Pain in unspecified limb: Secondary | ICD-10-CM | POA: Diagnosis not present

## 2012-01-22 DIAGNOSIS — R413 Other amnesia: Secondary | ICD-10-CM | POA: Diagnosis not present

## 2012-01-22 DIAGNOSIS — E782 Mixed hyperlipidemia: Secondary | ICD-10-CM | POA: Diagnosis not present

## 2012-01-22 DIAGNOSIS — Z0000 Encounter for general adult medical examination without abnormal findings: Secondary | ICD-10-CM | POA: Diagnosis not present

## 2012-01-22 DIAGNOSIS — I10 Essential (primary) hypertension: Secondary | ICD-10-CM | POA: Diagnosis not present

## 2012-02-27 DIAGNOSIS — Z23 Encounter for immunization: Secondary | ICD-10-CM | POA: Diagnosis not present

## 2012-03-18 DIAGNOSIS — M79609 Pain in unspecified limb: Secondary | ICD-10-CM | POA: Diagnosis not present

## 2012-03-18 DIAGNOSIS — B351 Tinea unguium: Secondary | ICD-10-CM | POA: Diagnosis not present

## 2012-03-31 DIAGNOSIS — H4011X0 Primary open-angle glaucoma, stage unspecified: Secondary | ICD-10-CM | POA: Diagnosis not present

## 2012-03-31 DIAGNOSIS — H409 Unspecified glaucoma: Secondary | ICD-10-CM | POA: Diagnosis not present

## 2012-06-08 ENCOUNTER — Emergency Department (HOSPITAL_COMMUNITY)
Admission: EM | Admit: 2012-06-08 | Discharge: 2012-06-08 | Disposition: A | Discharge status: Home / Self Care | Payer: Medicare Other | Attending: Emergency Medicine | Admitting: Emergency Medicine

## 2012-06-08 ENCOUNTER — Emergency Department (HOSPITAL_COMMUNITY): Payer: Medicare Other

## 2012-06-08 ENCOUNTER — Encounter (HOSPITAL_COMMUNITY): Payer: Self-pay

## 2012-06-08 DIAGNOSIS — N390 Urinary tract infection, site not specified: Secondary | ICD-10-CM | POA: Diagnosis not present

## 2012-06-08 DIAGNOSIS — I10 Essential (primary) hypertension: Secondary | ICD-10-CM | POA: Insufficient documentation

## 2012-06-08 DIAGNOSIS — R404 Transient alteration of awareness: Secondary | ICD-10-CM | POA: Insufficient documentation

## 2012-06-08 DIAGNOSIS — R918 Other nonspecific abnormal finding of lung field: Secondary | ICD-10-CM | POA: Diagnosis not present

## 2012-06-08 DIAGNOSIS — F29 Unspecified psychosis not due to a substance or known physiological condition: Secondary | ICD-10-CM | POA: Insufficient documentation

## 2012-06-08 DIAGNOSIS — R410 Disorientation, unspecified: Secondary | ICD-10-CM

## 2012-06-08 DIAGNOSIS — R7989 Other specified abnormal findings of blood chemistry: Secondary | ICD-10-CM | POA: Diagnosis not present

## 2012-06-08 DIAGNOSIS — R55 Syncope and collapse: Secondary | ICD-10-CM | POA: Diagnosis not present

## 2012-06-08 HISTORY — DX: Essential (primary) hypertension: I10

## 2012-06-08 LAB — URINALYSIS, ROUTINE W REFLEX MICROSCOPIC
Glucose, UA: NEGATIVE mg/dL
Specific Gravity, Urine: 1.015 (ref 1.005–1.030)

## 2012-06-08 LAB — CBC WITH DIFFERENTIAL/PLATELET
Basophils Absolute: 0.1 10*3/uL (ref 0.0–0.1)
Basophils Relative: 1 % (ref 0–1)
Eosinophils Absolute: 0.1 10*3/uL (ref 0.0–0.7)
Eosinophils Relative: 2 % (ref 0–5)
HCT: 44.8 % (ref 36.0–46.0)
MCHC: 34.8 g/dL (ref 30.0–36.0)
MCV: 87.3 fL (ref 78.0–100.0)
Monocytes Absolute: 0.6 10*3/uL (ref 0.1–1.0)
Neutro Abs: 5.2 10*3/uL (ref 1.7–7.7)
RDW: 13.1 % (ref 11.5–15.5)

## 2012-06-08 LAB — COMPREHENSIVE METABOLIC PANEL
AST: 27 U/L (ref 0–37)
Albumin: 4 g/dL (ref 3.5–5.2)
Calcium: 10.2 mg/dL (ref 8.4–10.5)
Creatinine, Ser: 1.22 mg/dL — ABNORMAL HIGH (ref 0.50–1.10)
Total Protein: 7.8 g/dL (ref 6.0–8.3)

## 2012-06-08 LAB — URINE MICROSCOPIC-ADD ON

## 2012-06-08 MED ORDER — DEXTROSE 5 % IV SOLN
1.0000 g | Freq: Once | INTRAVENOUS | Status: AC
  Administered 2012-06-08: 1 g via INTRAVENOUS
  Filled 2012-06-08: qty 10

## 2012-06-08 MED ORDER — CEPHALEXIN 500 MG PO CAPS: 500.0000 mg | ORAL_CAPSULE | Freq: Four times a day (QID) | ORAL | Status: DC

## 2012-06-08 NOTE — ED Notes (Signed)
Family at bedside. 

## 2012-06-08 NOTE — ED Notes (Signed)
Pt here with [facility] for syncope.  Pt was at home in kitchen and began swaying and family assisted her to floor.  Pt asymptomatic except pt thinks it is 85.  Pt has glucose of 268 and 140/102. ?emisis prior to arrival.  Pt has EKG with ems, NSR, negative orthostatics.

## 2012-06-08 NOTE — ED Notes (Signed)
Patient transported to CT and xray 

## 2012-06-08 NOTE — ED Provider Notes (Signed)
History     CSN: [id number]  Arrival date & time 06/08/12  0901   First MD Initiated Contact with Patient 06/08/12 [phone]      Chief Complaint  Patient presents with  . Near Syncope    (Consider location/radiation/quality/duration/timing/severity/associated sxs/prior treatment) HPI Level 5 caveat due to confusion Pt from home via EMS. She reports this morning she was feeling dizzy, like room-spinning and lightheaded. Her son helped her to sit down in the kitchen. There was a brief episode of loss of consciousness but she denies any falls, headache, blurry vision, CP, SOB, palpitations, N/V/D, dysuria. Daughter at the bedside states she had similar symptoms in the past with UTI.    Past Medical History  Diagnosis Date  . Hypertension     Past Surgical History  Procedure Date  . Knee arthroscopy     No family history on file.  History  Substance Use Topics  . Smoking status: Not on file  . Smokeless tobacco: Not on file  . Alcohol Use:     OB History    Grav Para Term Preterm Abortions TAB SAB Ect Mult Living                  Review of Systems Unable to assess due to mental status.   Allergies  Review of patient's allergies indicates no known allergies.  Home Medications  No current outpatient prescriptions on file.  BP 127/68  Pulse 63  Temp 97.3 F (36.3 C) (Oral)  Resp 20  SpO2 97%  Physical Exam  Nursing note and vitals reviewed. Constitutional: She appears well-developed and well-nourished.  HENT:  Head: Normocephalic and atraumatic.  Eyes: EOM are normal. Pupils are equal, round, and reactive to light.  Neck: Normal range of motion. Neck supple.  Cardiovascular: Normal rate, normal heart sounds and intact distal pulses.   Pulmonary/Chest: Effort normal and breath sounds normal.  Abdominal: Bowel sounds are normal. She exhibits no distension. There is no tenderness.  Musculoskeletal: Normal range of motion. She exhibits no edema and no tenderness.   Neurological: She is alert. She has normal strength. She displays normal reflexes. No cranial nerve deficit or sensory deficit. Coordination normal.       Oriented to person and place, but not time or president  Skin: Skin is warm and dry. No rash noted.  Psychiatric: She has a normal mood and affect.    ED Course  Procedures (including critical care time)  Labs Reviewed  CBC WITH DIFFERENTIAL - Abnormal; Notable for the following:    RBC 5.13 (*)     Hemoglobin 15.6 (*)     All other components within normal limits  COMPREHENSIVE METABOLIC PANEL - Abnormal; Notable for the following:    Glucose, Bld 145 (*)     BUN 34 (*)     Creatinine, Ser 1.22 (*)     GFR calc non Af Amer 38 (*)     GFR calc Af Amer 44 (*)     All other components within normal limits  URINALYSIS, ROUTINE W REFLEX MICROSCOPIC - Abnormal; Notable for the following:    APPearance CLOUDY (*)     Hgb urine dipstick SMALL (*)     Protein, ur 100 (*)     Nitrite POSITIVE (*)     Leukocytes, UA LARGE (*)     All other components within normal limits  URINE MICROSCOPIC-ADD ON - Abnormal; Notable for the following:    Squamous Epithelial / LPF FEW (*)  Bacteria, UA MANY (*)     All other components within normal limits  TROPONIN I  URINE CULTURE   Dg Chest 2 View  06/08/2012  *RADIOLOGY REPORT*  Clinical Data: Syncope, confusion.  CHEST - 2 VIEW  Comparison: 10/14/2007.  Findings: Trachea is midline.  Heart size normal.  Ascending aorta is mildly prominent, as before.  Lungs are hyperinflated but otherwise clear.  No pleural fluid.  IMPRESSION: Hyperinflation without acute finding.   Original Report Authenticated By: Leanna Battles, M.D.    Ct Head Wo Contrast  06/08/2012  *RADIOLOGY REPORT*  Clinical Data: Syncope.  CT HEAD WITHOUT CONTRAST  Technique:  Contiguous axial images were obtained from the base of the skull through the vertex without contrast.  Comparison: None.  Findings: No evidence of acute infarct,  acute hemorrhage, mass lesion, mass effect or definite hydrocephalus.  Ventricular dilatation is likely in proportion to the degree of atrophy. Fairly extensive periventricular low attenuation.  A faint area of rounded high density along the inner table of the left lateral posterior fossa (image 9) may be artifactual due to beam hardening. Visualized portions of the paranasal sinuses show partial opacification of a single left ethmoid air cell.  No air fluid levels.  IMPRESSION:  1.  No acute intracranial abnormality. 2.  Ventricular dilatation is likely in portion to the degree of atrophy. Normal pressure hydrocephalus cannot be definitively excluded. 3.  Chronic microvascular white matter ischemic changes.   Original Report Authenticated By: Leanna Battles, M.D.      No diagnosis found.    MDM   Date: 06/08/2012  Rate: 60  Rhythm: normal sinus rhythm  QRS Axis: left  Intervals: normal  ST/T Wave abnormalities: normal  Conduction Disutrbances:none  Narrative Interpretation:   Old EKG Reviewed: none available  Labs and imaging reviewed. She has clear UTI. Symptoms not consistent with NPH, acute in onset, no ataxia, etc. Daughter is comfortable with the patient going home. Urine sent for culture. Will Rx Keflex.         Francesco Provencal B. Bernette Mayers, MD 06/08/12 1207

## 2012-06-08 NOTE — ED Notes (Signed)
Pt given water per Dr Bernette Mayers.

## 2012-06-08 NOTE — ED Notes (Signed)
Walked patient to the bathroom, patient did good with walking

## 2012-06-08 NOTE — ED Notes (Signed)
MD at bedside. 

## 2012-06-10 LAB — URINE CULTURE: Colony Count: 40000

## 2012-06-11 NOTE — ED Notes (Signed)
+  Urine. Patient treated with Keflex. Sensitive to same. Per protocol MD. °

## 2012-06-13 DIAGNOSIS — I10 Essential (primary) hypertension: Secondary | ICD-10-CM | POA: Diagnosis not present

## 2012-06-13 DIAGNOSIS — F05 Delirium due to known physiological condition: Secondary | ICD-10-CM | POA: Diagnosis not present

## 2012-06-13 DIAGNOSIS — N390 Urinary tract infection, site not specified: Secondary | ICD-10-CM | POA: Diagnosis not present

## 2012-06-13 DIAGNOSIS — R6889 Other general symptoms and signs: Secondary | ICD-10-CM | POA: Diagnosis not present

## 2012-06-13 DIAGNOSIS — F0390 Unspecified dementia without behavioral disturbance: Secondary | ICD-10-CM | POA: Diagnosis not present

## 2012-06-13 DIAGNOSIS — E782 Mixed hyperlipidemia: Secondary | ICD-10-CM | POA: Diagnosis not present

## 2012-07-11 DIAGNOSIS — F0390 Unspecified dementia without behavioral disturbance: Secondary | ICD-10-CM | POA: Diagnosis not present

## 2012-07-11 DIAGNOSIS — H6120 Impacted cerumen, unspecified ear: Secondary | ICD-10-CM | POA: Diagnosis not present

## 2012-07-11 DIAGNOSIS — R5381 Other malaise: Secondary | ICD-10-CM | POA: Diagnosis not present

## 2012-07-11 DIAGNOSIS — I10 Essential (primary) hypertension: Secondary | ICD-10-CM | POA: Diagnosis not present

## 2012-07-11 DIAGNOSIS — R5383 Other fatigue: Secondary | ICD-10-CM | POA: Diagnosis not present

## 2012-08-20 DIAGNOSIS — R21 Rash and other nonspecific skin eruption: Secondary | ICD-10-CM | POA: Diagnosis not present

## 2012-08-25 DIAGNOSIS — B029 Zoster without complications: Secondary | ICD-10-CM | POA: Diagnosis not present

## 2012-09-02 DIAGNOSIS — H4011X0 Primary open-angle glaucoma, stage unspecified: Secondary | ICD-10-CM | POA: Diagnosis not present

## 2012-09-02 DIAGNOSIS — H409 Unspecified glaucoma: Secondary | ICD-10-CM | POA: Diagnosis not present

## 2012-09-04 ENCOUNTER — Encounter (HOSPITAL_COMMUNITY): Payer: Self-pay | Admitting: *Deleted

## 2012-09-04 ENCOUNTER — Emergency Department (HOSPITAL_COMMUNITY)
Admission: EM | Admit: 2012-09-04 | Discharge: 2012-09-04 | Disposition: A | Discharge status: Home / Self Care | Payer: Medicare Other | Attending: Emergency Medicine | Admitting: Emergency Medicine

## 2012-09-04 DIAGNOSIS — R404 Transient alteration of awareness: Secondary | ICD-10-CM | POA: Insufficient documentation

## 2012-09-04 DIAGNOSIS — I10 Essential (primary) hypertension: Secondary | ICD-10-CM | POA: Insufficient documentation

## 2012-09-04 DIAGNOSIS — Z79899 Other long term (current) drug therapy: Secondary | ICD-10-CM | POA: Insufficient documentation

## 2012-09-04 DIAGNOSIS — N390 Urinary tract infection, site not specified: Secondary | ICD-10-CM | POA: Insufficient documentation

## 2012-09-04 DIAGNOSIS — R5381 Other malaise: Secondary | ICD-10-CM | POA: Diagnosis not present

## 2012-09-04 DIAGNOSIS — F0390 Unspecified dementia without behavioral disturbance: Secondary | ICD-10-CM | POA: Insufficient documentation

## 2012-09-04 DIAGNOSIS — Z7982 Long term (current) use of aspirin: Secondary | ICD-10-CM | POA: Diagnosis not present

## 2012-09-04 DIAGNOSIS — R55 Syncope and collapse: Secondary | ICD-10-CM | POA: Diagnosis not present

## 2012-09-04 LAB — POCT I-STAT TROPONIN I

## 2012-09-04 LAB — CBC WITH DIFFERENTIAL/PLATELET
Basophils Absolute: 0.1 10*3/uL (ref 0.0–0.1)
Eosinophils Absolute: 0.1 10*3/uL (ref 0.0–0.7)
Eosinophils Relative: 1 % (ref 0–5)
HCT: 37.8 % (ref 36.0–46.0)
Lymphs Abs: 1 10*3/uL (ref 0.7–4.0)
MCH: 31.5 pg (ref 26.0–34.0)
MCV: 85.7 fL (ref 78.0–100.0)
Monocytes Absolute: 0.7 10*3/uL (ref 0.1–1.0)
Platelets: 225 10*3/uL (ref 150–400)
RDW: 14.8 % (ref 11.5–15.5)

## 2012-09-04 LAB — URINE MICROSCOPIC-ADD ON

## 2012-09-04 LAB — URINALYSIS, ROUTINE W REFLEX MICROSCOPIC
Bilirubin Urine: NEGATIVE
Glucose, UA: NEGATIVE mg/dL
Hgb urine dipstick: NEGATIVE
Protein, ur: NEGATIVE mg/dL
Urobilinogen, UA: 0.2 mg/dL (ref 0.0–1.0)

## 2012-09-04 LAB — COMPREHENSIVE METABOLIC PANEL
ALT: 15 U/L (ref 0–35)
Calcium: 9.2 mg/dL (ref 8.4–10.5)
Creatinine, Ser: 1.08 mg/dL (ref 0.50–1.10)
GFR calc Af Amer: 51 mL/min — ABNORMAL LOW (ref >90)
GFR calc non Af Amer: 44 mL/min — ABNORMAL LOW (ref >90)
Glucose, Bld: 95 mg/dL (ref 70–99)
Sodium: 133 mEq/L — ABNORMAL LOW (ref 135–145)
Total Protein: 7.2 g/dL (ref 6.0–8.3)

## 2012-09-04 MED ORDER — NITROFURANTOIN MONOHYD MACRO 100 MG PO CAPS: 100.0000 mg | ORAL_CAPSULE | Freq: Two times a day (BID) | ORAL | Status: DC

## 2012-09-04 NOTE — ED Notes (Signed)
Discharge instructions reviewed with pt and daughter. Both verbalized understanding.

## 2012-09-04 NOTE — ED Notes (Signed)
Pt arrived by [facility]. Pt was sitting in church approx 0930. Family saw pt put her head down and had period of being "unresponsive" possible syncopal episode.

## 2012-09-04 NOTE — ED Notes (Signed)
Pt ambulated to bathroom with minimal assist 

## 2012-09-05 NOTE — ED Provider Notes (Signed)
History     CSN: [id number]  Arrival date & time 09/04/12  1025   First MD Initiated Contact with Patient 09/04/12 1149      Chief Complaint  Patient presents with  . Near Syncope    (Consider location/radiation/quality/duration/timing/severity/associated sxs/prior treatment) HPI Comments: Pt is an 79 year old woman who was in church.  She slumped over a little bit and was unconscious for 30-40 seconds.  There were no convulsive movements.  She had no difficulty with her speech afterwards, and there was no paralysis noted.  Her daughter, who was with her, said that she was too weak to stand after the episode, and she was taken out of church in a wheel chair, where she waited for EMS to take her to [facility] ED for evaluation.  She has had shingles affecting her right leg about 2 weeks ago.  She had a similar syncopal episode in January 2014.  Patient is a 78 y.o. female presenting with syncope. The history is provided by a relative. No language interpreter was used.  Loss of Consciousness  This is a new problem. The current episode started less than 1 hour ago. Episode frequency: Briefly unconscious, for 30-40 seconds. The problem has been gradually improving. She lost consciousness for a period of less than one minute. Associated with: [employer]. Associated symptoms include weakness. Pertinent negatives include bladder incontinence, bowel incontinence, chest pain, confusion, seizures and slurred speech. She has tried nothing (Pt transported to [facility] ED via EMS.) for the symptoms. Her past medical history is significant for HTN. Past medical history comments: Dementia.    Past Medical History  Diagnosis Date  . Hypertension     Past Surgical History  Procedure Laterality Date  . Knee arthroscopy      History reviewed. No pertinent family history.  History  Substance Use Topics  . Smoking status: Not on file  . Smokeless tobacco: Not on file  . Alcohol Use: No     OB History   Grav Para Term Preterm Abortions TAB SAB Ect Mult Living                  Review of Systems  Unable to perform ROS: Dementia  Cardiovascular: Positive for syncope. Negative for chest pain.  Gastrointestinal: Negative for bowel incontinence.  Genitourinary: Negative for bladder incontinence.  Neurological: Positive for weakness. Negative for seizures.  Psychiatric/Behavioral: Negative for confusion.    Allergies  Review of patient's allergies indicates no known allergies.  Home Medications   Current Outpatient Rx  Name  Route  Sig  Dispense  Refill  . alendronate (FOSAMAX) 70 MG tablet   Oral   Take 70 mg by mouth every 7 (seven) days. Take with a full glass of water on an empty stomach.  On wednesdays         . amLODipine (NORVASC) 5 MG tablet   Oral   Take 5 mg by mouth daily.         . Ascorbic Acid (VITAMIN C PO)   Oral   Take 1 tablet by mouth daily.         Marland Kitchen aspirin 325 MG tablet   Oral   Take 325 mg by mouth at bedtime.         Marland Kitchen CALCIUM PO   Oral   Take 2 tablets by mouth daily.         . cetirizine (ZYRTEC) 10 MG tablet   Oral   Take 10  mg by mouth at bedtime.         . Cholecalciferol (VITAMIN D-3 PO)   Oral   Take 1 tablet by mouth 2 (two) times daily.         Marland Kitchen donepezil (ARICEPT) 10 MG tablet   Oral   Take 10 mg by mouth at bedtime.         . hydrochlorothiazide (HYDRODIURIL) 25 MG tablet   Oral   Take 25 mg by mouth daily.         . iron polysaccharides (NIFEREX) 150 MG capsule   Oral   Take 150 mg by mouth daily.         . Omega-3 Fatty Acids (FISH OIL PO)   Oral   Take 1 capsule by mouth 2 (two) times daily.         . timolol (BETIMOL) 0.5 % ophthalmic solution   Both Eyes   Place 1 drop into both eyes daily.          . travoprost, benzalkonium, (TRAVATAN) 0.004 % ophthalmic solution   Both Eyes   Place 1 drop into both eyes at bedtime.         . nitrofurantoin,  macrocrystal-monohydrate, (MACROBID) 100 MG capsule   Oral   Take 1 capsule (100 mg total) by mouth 2 (two) times daily.   14 capsule   0     BP 159/67  Pulse 55  Temp(Src) 97.7 F (36.5 C) (Oral)  Resp 13  SpO2 99%  Physical Exam  Nursing note and vitals reviewed. Constitutional:  Pleasant elderly woman, no distress.  Says, "I'm all right."  HENT:  Head: Normocephalic and atraumatic.  Right Ear: External ear normal.  Left Ear: External ear normal.  Mouth/Throat: Oropharynx is clear and moist.  Eyes: Conjunctivae and EOM are normal. Pupils are equal, round, and reactive to light.  Neck: Normal range of motion. Neck supple.  No caroltid bruit.  Cardiovascular: Normal rate, regular rhythm and normal heart sounds.   Pulmonary/Chest: Effort normal and breath sounds normal.  Abdominal: Soft.  Musculoskeletal: Normal range of motion. She exhibits no edema and no tenderness.  Neurological: She is alert.  No sensory or motor deficit.  Skin: Skin is warm and dry.  Has hyperpigmented macules on left thigh, residua of herpes zoster.  Psychiatric: She has a normal mood and affect. Her behavior is normal.    ED Course  Procedures (including critical care time)   Date: 09/04/2012  Rate:57  Rhythm: normal sinus rhythm  QRS Axis: left  Intervals: normal QRS:  Poor R wave progression in precordial leads suggests old anterior myocardial infarction.  ST/T Wave abnormalities: normal  Conduction Disutrbances:none  Narrative Interpretation: Abnormal EKG  Old EKG Reviewed: unchanged    Results for orders placed during the hospital encounter of 09/04/12  URINALYSIS, ROUTINE W REFLEX MICROSCOPIC      Result Value Range   Color, Urine YELLOW  YELLOW   APPearance CLOUDY (*) CLEAR   Specific Gravity, Urine 1.014  1.005 - 1.030   pH 8.0  5.0 - 8.0   Glucose, UA NEGATIVE  NEGATIVE mg/dL   Hgb urine dipstick NEGATIVE  NEGATIVE   Bilirubin Urine NEGATIVE  NEGATIVE   Ketones, ur  NEGATIVE  NEGATIVE mg/dL   Protein, ur NEGATIVE  NEGATIVE mg/dL   Urobilinogen, UA 0.2  0.0 - 1.0 mg/dL   Nitrite NEGATIVE  NEGATIVE   Leukocytes, UA MODERATE (*) NEGATIVE  CBC WITH DIFFERENTIAL  Result Value Range   WBC 7.1  4.0 - 10.5 K/uL   RBC 4.41  3.87 - 5.11 MIL/uL   Hemoglobin 13.9  12.0 - 15.0 g/dL   HCT 16.1  09.6 - 04.5 %   MCV 85.7  78.0 - 100.0 fL   MCH 31.5  26.0 - 34.0 pg   MCHC 36.8 (*) 30.0 - 36.0 g/dL   RDW 40.9  81.1 - 91.4 %   Platelets 225  150 - 400 K/uL   Neutrophils Relative 74  43 - 77 %   Neutro Abs 5.3  1.7 - 7.7 K/uL   Lymphocytes Relative 15  12 - 46 %   Lymphs Abs 1.0  0.7 - 4.0 K/uL   Monocytes Relative 9  3 - 12 %   Monocytes Absolute 0.7  0.1 - 1.0 K/uL   Eosinophils Relative 1  0 - 5 %   Eosinophils Absolute 0.1  0.0 - 0.7 K/uL   Basophils Relative 1  0 - 1 %   Basophils Absolute 0.1  0.0 - 0.1 K/uL  COMPREHENSIVE METABOLIC PANEL      Result Value Range   Sodium 133 (*) 135 - 145 mEq/L   Potassium 4.0  3.5 - 5.1 mEq/L   Chloride 94 (*) 96 - 112 mEq/L   CO2 30  19 - 32 mEq/L   Glucose, Bld 95  70 - 99 mg/dL   BUN 27 (*) 6 - 23 mg/dL   Creatinine, Ser 7.82  0.50 - 1.10 mg/dL   Calcium 9.2  8.4 - 95.6 mg/dL   Total Protein 7.2  6.0 - 8.3 g/dL   Albumin 3.6  3.5 - 5.2 g/dL   AST 23  0 - 37 U/L   ALT 15  0 - 35 U/L   Alkaline Phosphatase 64  39 - 117 U/L   Total Bilirubin 0.5  0.3 - 1.2 mg/dL   GFR calc non Af Amer 44 (*) >90 mL/min   GFR calc Af Amer 51 (*) >90 mL/min  URINE MICROSCOPIC-ADD ON      Result Value Range   Squamous Epithelial / LPF FEW (*) RARE   WBC, UA 21-50  <3 WBC/hpf   Bacteria, UA RARE  RARE  POCT I-STAT TROPONIN I      Result Value Range   Troponin i, poc 0.02  0.00 - 0.08 ng/mL   Comment 3            Lab workup showed a UTI.  Pt's daughter said that pt has had a recurrent problem with UTI.  Will Rx with Macrobid, advised followup with her PCP.      1. Syncope   2. UTI (lower urinary tract infection)             Carleene Cooper III, MD 09/05/12 1208

## 2012-09-15 DIAGNOSIS — R350 Frequency of micturition: Secondary | ICD-10-CM | POA: Diagnosis not present

## 2012-09-15 DIAGNOSIS — B0229 Other postherpetic nervous system involvement: Secondary | ICD-10-CM | POA: Diagnosis not present

## 2012-09-15 DIAGNOSIS — R4182 Altered mental status, unspecified: Secondary | ICD-10-CM | POA: Diagnosis not present

## 2012-09-15 DIAGNOSIS — M6281 Muscle weakness (generalized): Secondary | ICD-10-CM | POA: Diagnosis not present

## 2012-09-24 DIAGNOSIS — F0390 Unspecified dementia without behavioral disturbance: Secondary | ICD-10-CM | POA: Diagnosis not present

## 2012-09-24 DIAGNOSIS — M6281 Muscle weakness (generalized): Secondary | ICD-10-CM | POA: Diagnosis not present

## 2012-09-24 DIAGNOSIS — I10 Essential (primary) hypertension: Secondary | ICD-10-CM | POA: Diagnosis not present

## 2012-09-24 DIAGNOSIS — R269 Unspecified abnormalities of gait and mobility: Secondary | ICD-10-CM | POA: Diagnosis not present

## 2012-09-24 DIAGNOSIS — D649 Anemia, unspecified: Secondary | ICD-10-CM | POA: Diagnosis not present

## 2012-09-24 DIAGNOSIS — B0229 Other postherpetic nervous system involvement: Secondary | ICD-10-CM | POA: Diagnosis not present

## 2012-10-06 ENCOUNTER — Other Ambulatory Visit: Payer: Self-pay | Admitting: Family Medicine

## 2012-10-06 ENCOUNTER — Other Ambulatory Visit: Payer: Self-pay

## 2012-10-06 ENCOUNTER — Inpatient Hospital Stay (HOSPITAL_COMMUNITY)
Admission: EM | Admit: 2012-10-06 | Discharge: 2012-10-10 | DRG: 690 | Disposition: A | Discharge status: Skilled Nursing Facility | Payer: Medicare Other | Attending: Internal Medicine | Admitting: Internal Medicine

## 2012-10-06 ENCOUNTER — Encounter (HOSPITAL_COMMUNITY): Payer: Self-pay | Admitting: Emergency Medicine

## 2012-10-06 ENCOUNTER — Emergency Department (HOSPITAL_COMMUNITY): Payer: Medicare Other

## 2012-10-06 ENCOUNTER — Ambulatory Visit
Admission: RE | Admit: 2012-10-06 | Discharge: 2012-10-06 | Disposition: A | Discharge status: Home / Self Care | Payer: Medicare Other | Source: Ambulatory Visit | Attending: Family Medicine | Admitting: Family Medicine

## 2012-10-06 DIAGNOSIS — I10 Essential (primary) hypertension: Secondary | ICD-10-CM

## 2012-10-06 DIAGNOSIS — R209 Unspecified disturbances of skin sensation: Secondary | ICD-10-CM | POA: Diagnosis not present

## 2012-10-06 DIAGNOSIS — M6281 Muscle weakness (generalized): Secondary | ICD-10-CM | POA: Diagnosis not present

## 2012-10-06 DIAGNOSIS — E871 Hypo-osmolality and hyponatremia: Secondary | ICD-10-CM

## 2012-10-06 DIAGNOSIS — R269 Unspecified abnormalities of gait and mobility: Secondary | ICD-10-CM | POA: Diagnosis not present

## 2012-10-06 DIAGNOSIS — S298XXA Other specified injuries of thorax, initial encounter: Secondary | ICD-10-CM | POA: Diagnosis not present

## 2012-10-06 DIAGNOSIS — F0390 Unspecified dementia without behavioral disturbance: Secondary | ICD-10-CM | POA: Diagnosis not present

## 2012-10-06 DIAGNOSIS — E876 Hypokalemia: Secondary | ICD-10-CM | POA: Diagnosis present

## 2012-10-06 DIAGNOSIS — D181 Lymphangioma, any site: Secondary | ICD-10-CM | POA: Diagnosis not present

## 2012-10-06 DIAGNOSIS — I6203 Nontraumatic chronic subdural hemorrhage: Secondary | ICD-10-CM

## 2012-10-06 DIAGNOSIS — N390 Urinary tract infection, site not specified: Principal | ICD-10-CM

## 2012-10-06 DIAGNOSIS — W010XXA Fall on same level from slipping, tripping and stumbling without subsequent striking against object, initial encounter: Secondary | ICD-10-CM | POA: Diagnosis present

## 2012-10-06 DIAGNOSIS — I6200 Nontraumatic subdural hemorrhage, unspecified: Secondary | ICD-10-CM | POA: Diagnosis not present

## 2012-10-06 DIAGNOSIS — Z9181 History of falling: Secondary | ICD-10-CM

## 2012-10-06 DIAGNOSIS — T502X5A Adverse effect of carbonic-anhydrase inhibitors, benzothiadiazides and other diuretics, initial encounter: Secondary | ICD-10-CM | POA: Diagnosis present

## 2012-10-06 DIAGNOSIS — E785 Hyperlipidemia, unspecified: Secondary | ICD-10-CM

## 2012-10-06 DIAGNOSIS — G9608 Other cranial cerebrospinal fluid leak: Secondary | ICD-10-CM

## 2012-10-06 DIAGNOSIS — R296 Repeated falls: Secondary | ICD-10-CM

## 2012-10-06 DIAGNOSIS — R279 Unspecified lack of coordination: Secondary | ICD-10-CM | POA: Diagnosis not present

## 2012-10-06 DIAGNOSIS — W19XXXA Unspecified fall, initial encounter: Secondary | ICD-10-CM

## 2012-10-06 HISTORY — DX: Unspecified dementia, unspecified severity, without behavioral disturbance, psychotic disturbance, mood disturbance, and anxiety: F03.90

## 2012-10-06 HISTORY — DX: Unspecified osteoarthritis, unspecified site: M19.90

## 2012-10-06 LAB — COMPREHENSIVE METABOLIC PANEL
ALT: 38 U/L — ABNORMAL HIGH (ref 0–35)
Alkaline Phosphatase: 93 U/L (ref 39–117)
BUN: 19 mg/dL (ref 6–23)
CO2: 32 mEq/L (ref 19–32)
Chloride: 84 mEq/L — ABNORMAL LOW (ref 96–112)
GFR calc Af Amer: 57 mL/min — ABNORMAL LOW (ref >90)
GFR calc non Af Amer: 49 mL/min — ABNORMAL LOW (ref >90)
Glucose, Bld: 149 mg/dL — ABNORMAL HIGH (ref 70–99)
Potassium: 3.7 mEq/L (ref 3.5–5.1)
Sodium: 127 mEq/L — ABNORMAL LOW (ref 135–145)
Total Bilirubin: 0.6 mg/dL (ref 0.3–1.2)
Total Protein: 7.4 g/dL (ref 6.0–8.3)

## 2012-10-06 LAB — POCT I-STAT TROPONIN I: Troponin i, poc: 0.03 ng/mL (ref 0.00–0.08)

## 2012-10-06 LAB — CBC WITH DIFFERENTIAL/PLATELET
Basophils Absolute: 0 10*3/uL (ref 0.0–0.1)
Basophils Relative: 0 % (ref 0–1)
Eosinophils Absolute: 0.1 10*3/uL (ref 0.0–0.7)
Eosinophils Absolute: 0.1 10*3/uL (ref 0.0–0.7)
Eosinophils Relative: 1 % (ref 0–5)
Hemoglobin: 11.8 g/dL — ABNORMAL LOW (ref 12.0–15.0)
Lymphs Abs: 1.3 10*3/uL (ref 0.7–4.0)
MCH: 30.7 pg (ref 26.0–34.0)
MCHC: 36.2 g/dL — ABNORMAL HIGH (ref 30.0–36.0)
MCV: 84.9 fL (ref 78.0–100.0)
Monocytes Relative: 9 % (ref 3–12)
Neutro Abs: 6.4 10*3/uL (ref 1.7–7.7)
Neutrophils Relative %: 75 % (ref 43–77)
Neutrophils Relative %: 82 % — ABNORMAL HIGH (ref 43–77)
Platelets: 240 10*3/uL (ref 150–400)
Platelets: 275 10*3/uL (ref 150–400)
RBC: 3.85 MIL/uL — ABNORMAL LOW (ref 3.87–5.11)
RBC: 4.49 MIL/uL (ref 3.87–5.11)
RDW: 14.4 % (ref 11.5–15.5)
WBC: 8.5 10*3/uL (ref 4.0–10.5)

## 2012-10-06 MED ORDER — AMLODIPINE BESYLATE 5 MG PO TABS
5.0000 mg | ORAL_TABLET | Freq: Once | ORAL | Status: DC
  Filled 2012-10-06: qty 1

## 2012-10-06 MED ORDER — TIMOLOL HEMIHYDRATE 0.5 % OP SOLN
1.0000 [drp] | Freq: Every day | OPHTHALMIC | Status: DC
  Filled 2012-10-06 (×9): qty 0.1

## 2012-10-06 MED ORDER — IOHEXOL 300 MG/ML  SOLN
75.0000 mL | Freq: Once | INTRAMUSCULAR | Status: AC | PRN
  Administered 2012-10-06: 75 mL via INTRAVENOUS

## 2012-10-06 MED ORDER — TIMOLOL MALEATE 0.5 % OP SOLN: 1.0000 [drp] | Freq: Every day | OPHTHALMIC | Status: DC

## 2012-10-06 MED ORDER — ALENDRONATE SODIUM 70 MG PO TABS: 70.0000 mg | ORAL_TABLET | ORAL | Status: DC

## 2012-10-06 MED ORDER — ONDANSETRON HCL 4 MG PO TABS: 4.0000 mg | ORAL_TABLET | Freq: Four times a day (QID) | ORAL | Status: DC | PRN

## 2012-10-06 MED ORDER — HYDROCHLOROTHIAZIDE 25 MG PO TABS: 25.0000 mg | ORAL_TABLET | Freq: Every day | ORAL | Status: DC

## 2012-10-06 MED ORDER — SODIUM CHLORIDE 0.9 % IV SOLN
INTRAVENOUS | Status: DC
  Administered 2012-10-07: 1000 mL via INTRAVENOUS
  Administered 2012-10-07: 800 mL via INTRAVENOUS
  Administered 2012-10-08: 50 mL/h via INTRAVENOUS

## 2012-10-06 MED ORDER — ACETAMINOPHEN 650 MG RE SUPP: 650.0000 mg | Freq: Four times a day (QID) | RECTAL | Status: DC | PRN

## 2012-10-06 MED ORDER — SODIUM CHLORIDE 0.9 % IJ SOLN
3.0000 mL | Freq: Two times a day (BID) | INTRAMUSCULAR | Status: DC
  Administered 2012-10-06 – 2012-10-10 (×5): 3 mL via INTRAVENOUS

## 2012-10-06 MED ORDER — ACETAMINOPHEN 325 MG PO TABS: 650.0000 mg | ORAL_TABLET | Freq: Four times a day (QID) | ORAL | Status: DC | PRN

## 2012-10-06 MED ORDER — HYDROCHLOROTHIAZIDE 25 MG PO TABS
25.0000 mg | ORAL_TABLET | Freq: Every day | ORAL | Status: DC
  Filled 2012-10-06: qty 1

## 2012-10-06 MED ORDER — POLYSACCHARIDE IRON COMPLEX 150 MG PO CAPS
150.0000 mg | ORAL_CAPSULE | Freq: Every day | ORAL | Status: DC
  Administered 2012-10-07 – 2012-10-10 (×4): 150 mg via ORAL
  Filled 2012-10-06 (×4): qty 1

## 2012-10-06 MED ORDER — ASPIRIN 325 MG PO TABS
325.0000 mg | ORAL_TABLET | Freq: Every day | ORAL | Status: DC
  Administered 2012-10-06 – 2012-10-09 (×4): 325 mg via ORAL
  Filled 2012-10-06 (×5): qty 1

## 2012-10-06 MED ORDER — SODIUM CHLORIDE 0.9 % IV SOLN
INTRAVENOUS | Status: AC
  Administered 2012-10-06: via INTRAVENOUS

## 2012-10-06 MED ORDER — DONEPEZIL HCL 10 MG PO TABS: 10.0000 mg | ORAL_TABLET | Freq: Every day | ORAL | Status: DC

## 2012-10-06 MED ORDER — OMEGA-3-ACID ETHYL ESTERS 1 G PO CAPS
1.0000 g | ORAL_CAPSULE | Freq: Every day | ORAL | Status: DC
  Administered 2012-10-07 – 2012-10-10 (×4): 1 g via ORAL
  Filled 2012-10-06 (×4): qty 1

## 2012-10-06 MED ORDER — HYDROCODONE-ACETAMINOPHEN 5-325 MG PO TABS: 1.0000 | ORAL_TABLET | ORAL | Status: DC | PRN

## 2012-10-06 MED ORDER — AMLODIPINE BESYLATE 5 MG PO TABS
5.0000 mg | ORAL_TABLET | Freq: Every day | ORAL | Status: DC
  Administered 2012-10-06: 5 mg via ORAL
  Filled 2012-10-06 (×2): qty 1

## 2012-10-06 MED ORDER — DONEPEZIL HCL 10 MG PO TABS
10.0000 mg | ORAL_TABLET | Freq: Every day | ORAL | Status: DC
  Administered 2012-10-06 – 2012-10-09 (×4): 10 mg via ORAL
  Filled 2012-10-06 (×5): qty 1

## 2012-10-06 MED ORDER — ASPIRIN 81 MG PO CHEW: 324.0000 mg | CHEWABLE_TABLET | Freq: Every day | ORAL | Status: DC

## 2012-10-06 MED ORDER — LORATADINE 10 MG PO TABS
10.0000 mg | ORAL_TABLET | Freq: Every day | ORAL | Status: DC
  Administered 2012-10-07 – 2012-10-10 (×4): 10 mg via ORAL
  Filled 2012-10-06 (×4): qty 1

## 2012-10-06 MED ORDER — TRAVOPROST (BAK FREE) 0.004 % OP SOLN
1.0000 [drp] | Freq: Every day | OPHTHALMIC | Status: DC
  Administered 2012-10-06 – 2012-10-09 (×4): 1 [drp] via OPHTHALMIC
  Filled 2012-10-06: qty 2.5

## 2012-10-06 MED ORDER — TIMOLOL MALEATE 0.5 % OP SOLN
1.0000 [drp] | Freq: Every day | OPHTHALMIC | Status: DC
  Administered 2012-10-07 – 2012-10-10 (×4): 1 [drp] via OPHTHALMIC
  Filled 2012-10-06: qty 5

## 2012-10-06 MED ORDER — ONDANSETRON HCL 4 MG/2ML IJ SOLN: 4.0000 mg | Freq: Four times a day (QID) | INTRAMUSCULAR | Status: DC | PRN

## 2012-10-06 MED ORDER — MORPHINE SULFATE 2 MG/ML IJ SOLN: 1.0000 mg | INTRAMUSCULAR | Status: DC | PRN

## 2012-10-06 MED ORDER — TRAVOPROST (BAK FREE) 0.004 % OP SOLN: 1.0000 [drp] | Freq: Every day | OPHTHALMIC | Status: DC

## 2012-10-06 NOTE — ED Notes (Signed)
Pt is xray. Family member waiting in room for patient

## 2012-10-06 NOTE — ED Provider Notes (Signed)
History     CSN: [id number]  Arrival date & time 10/06/12  1657   First MD Initiated Contact with Patient 10/06/12 1834      Chief Complaint  Patient presents with  . Abnormal Lab    (Consider location/radiation/quality/duration/timing/severity/associated sxs/prior treatment) HPI Comments: The patient is an 79 year old woman who has had multiple rales. She's had 3 falls in the past week. She has suffered large bruises, on her neck, left arm, and both shins. She had been seen by her family physician, Cam Hai M.D., who ordered lab tests and CT x-ray of her brain. The lab tests showed a sodium of 124, and this CT x-ray the brain showed a subdural hygroma. She therefore was advised to come to [facility] for evaluation.  Patient is a 79 y.o. female presenting with fall. The history is provided by medical records (The daughter). No language interpreter was used.  Fall Fall occurred: Three falls in the past week. She fell from a height of 1 to 2 ft. She landed on a hard floor. Point of impact: Neck, left arm, both shins. Pain location: She does not complain of pain. The pain is at a severity of 0/10. The patient is experiencing no pain. She was ambulatory at the scene. There was no entrapment after the fall. There was no drug use involved in the accident. There was no alcohol use involved in the accident. Associated symptoms include headaches. Exacerbated by: Nothing. Prehospitalization: Pt not seen by EMS.    Past Medical History  Diagnosis Date  . Hypertension   . Dementia     Past Surgical History  Procedure Laterality Date  . Knee arthroscopy      No family history on file.  History  Substance Use Topics  . Smoking status: Not on file  . Smokeless tobacco: Not on file  . Alcohol Use: No    OB History   Grav Para Term Preterm Abortions TAB SAB Ect Mult Living                  Review of Systems  Unable to perform ROS: Dementia  Neurological: Positive for  headaches.    Allergies  Review of patient's allergies indicates no known allergies.  Home Medications   Current Outpatient Rx  Name  Route  Sig  Dispense  Refill  . alendronate (FOSAMAX) 70 MG tablet   Oral   Take 70 mg by mouth every 7 (seven) days. Take with a full glass of water on an empty stomach.  On wednesdays         . amLODipine (NORVASC) 5 MG tablet   Oral   Take 5 mg by mouth at bedtime.          . Ascorbic Acid (VITAMIN C PO)   Oral   Take 1 tablet by mouth 2 (two) times daily.          Marland Kitchen aspirin 325 MG tablet   Oral   Take 325 mg by mouth at bedtime.         Marland Kitchen CALCIUM PO   Oral   Take 1 tablet by mouth 2 (two) times daily.          . cetirizine (ZYRTEC) 10 MG tablet   Oral   Take 10 mg by mouth at bedtime.         . Cholecalciferol (VITAMIN D-3 PO)   Oral   Take 1 tablet by mouth 2 (two) times daily.         Marland Kitchen  COENZYME Q-10 PO   Oral   Take 1 tablet by mouth every morning.         Marland Kitchen CRANBERRY PO   Oral   Take 1 capsule by mouth 2 (two) times daily.         Marland Kitchen donepezil (ARICEPT) 10 MG tablet   Oral   Take 10 mg by mouth at bedtime.         . hydrochlorothiazide (HYDRODIURIL) 25 MG tablet   Oral   Take 25 mg by mouth daily.         . iron polysaccharides (NIFEREX) 150 MG capsule   Oral   Take 150 mg by mouth daily.         . Omega-3 Fatty Acids (FISH OIL PO)   Oral   Take 1 capsule by mouth 2 (two) times daily.         . timolol (BETIMOL) 0.5 % ophthalmic solution   Right Eye   Place 1 drop into the right eye daily.          . travoprost, benzalkonium, (TRAVATAN) 0.004 % ophthalmic solution   Both Eyes   Place 1 drop into both eyes at bedtime.           BP 157/64  Pulse 58  Temp(Src) 98.1 F (36.7 C) (Oral)  Resp 22  Ht 5\' 2"  (1.575 m)  Wt 154 lb (69.854 kg)  BMI 28.16 kg/m2  SpO2 95%  Physical Exam  Nursing note and vitals reviewed. Constitutional:  Pleasant elderly lady, able to answer  simple questions, but unable to give her history.  HENT:  Head: Normocephalic and atraumatic.  Right Ear: External ear normal.  Left Ear: External ear normal.  Mouth/Throat: Oropharynx is clear and moist.  Eyes: Conjunctivae and EOM are normal.  Neck:  She has 3 small contusions each approximately 1 cm in diameter, on the left lateral neck. There is no bony deformity the neck or point tenderness.  Cardiovascular: Normal rate, regular rhythm and normal heart sounds.   Pulmonary/Chest: Effort normal and breath sounds normal.  Abdominal: Soft. Bowel sounds are normal.  Musculoskeletal:  She very large contusion on the lateral aspect of her left forearm extending from the elbow down to the wrist. He has 2 medium-size contusions each about 5 cm in diameter on the lower shins bilaterally. There is no bony deformity of the arms or legs.  Neurological:  No sensory or motor deficit.  Skin:  Multiple bruises.  Psychiatric:  Pleasantly demented.    ED Course  Procedures (including critical care time)   8:22 PM Results for orders placed during the hospital encounter of 10/06/12  CBC WITH DIFFERENTIAL      Result Value Range   WBC 10.0  4.0 - 10.5 K/uL   RBC 4.49  3.87 - 5.11 MIL/uL   Hemoglobin 13.8  12.0 - 15.0 g/dL   HCT 16.1  09.6 - 04.5 %   MCV 84.9  78.0 - 100.0 fL   MCH 30.7  26.0 - 34.0 pg   MCHC 36.2 (*) 30.0 - 36.0 g/dL   RDW 40.9  81.1 - 91.4 %   Platelets 275  150 - 400 K/uL   Neutrophils Relative 82 (*) 43 - 77 %   Neutro Abs 8.3 (*) 1.7 - 7.7 K/uL   Lymphocytes Relative 8 (*) 12 - 46 %   Lymphs Abs 0.8  0.7 - 4.0 K/uL   Monocytes Relative 9  3 - 12 %  Monocytes Absolute 0.9  0.1 - 1.0 K/uL   Eosinophils Relative 1  0 - 5 %   Eosinophils Absolute 0.1  0.0 - 0.7 K/uL   Basophils Relative 0  0 - 1 %   Basophils Absolute 0.0  0.0 - 0.1 K/uL  COMPREHENSIVE METABOLIC PANEL      Result Value Range   Sodium 127 (*) 135 - 145 mEq/L   Potassium 3.7  3.5 - 5.1 mEq/L    Chloride 84 (*) 96 - 112 mEq/L   CO2 32  19 - 32 mEq/L   Glucose, Bld 149 (*) 70 - 99 mg/dL   BUN 19  6 - 23 mg/dL   Creatinine, Ser 8.46  0.50 - 1.10 mg/dL   Calcium 96.2  8.4 - 95.2 mg/dL   Total Protein 7.4  6.0 - 8.3 g/dL   Albumin 3.7  3.5 - 5.2 g/dL   AST PENDING  0 - 37 U/L   ALT 38 (*) 0 - 35 U/L   Alkaline Phosphatase 93  39 - 117 U/L   Total Bilirubin 0.6  0.3 - 1.2 mg/dL   GFR calc non Af Amer 49 (*) >90 mL/min   GFR calc Af Amer 57 (*) >90 mL/min  POCT I-STAT TROPONIN I      Result Value Range   Troponin i, poc 0.03  0.00 - 0.08 ng/mL   Comment 3            Dg Chest 2 View  10/06/2012  *RADIOLOGY REPORT*  Clinical Data: Status post fall.  CHEST - 2 VIEW  Comparison: Two-view chest 06/08/2012.  Findings: The heart is mildly enlarged.  There is no edema or effusion to suggest failure.  Mild emphysematous changes are again noted.  No focal airspace disease is evident.  Mild leftward curvature is present in the thoracic spine.  IMPRESSION:  1.  Mild cardiomegaly without failure. 2.  Emphysema. 3.  No acute cardiopulmonary disease.   Original Report Authenticated By: Marin Roberts, M.D.    Ct Head W Wo Contrast  10/06/2012  *RADIOLOGY REPORT*  Clinical Data: Right-sided numbness and weakness, possible stroke.  CT HEAD WITHOUT AND WITH CONTRAST  Technique:  Contiguous axial images were obtained from the base of the skull through the vertex without and with intravenous contrast.  Contrast: 75mL OMNIPAQUE IOHEXOL 300 MG/ML  SOLN  Comparison:  06/08/2012 CT.  11/23/2006 MR.  Findings: There is no evidence for acute infarction, intracranial hemorrhage, mass lesion, hydrocephalus, or subdural hematoma. Small extra-axial fluid collection right posterior fossa relates to chronic hygroma/right cerebellar atrophy.  Moderate atrophy and moderate chronic microvascular ischemic change.   Ex vacuo ventricular enlargement without compelling features of normal pressure hydrocephalus, particularly  given the white matter disease noted on prior MR 2008.  Calvarium intact.  Post infusion, no abnormal intracranial enhancement. Compared with prior studies, the white matter disease appears to have slightly progressed.  IMPRESSION: Moderate atrophy and chronic microvascular ischemic change. Ventricular enlargement on an ex vacuo basis. No acute intracranial findings.  Chronic subdural hygroma versus focal atrophy right posterior fossa, unchanged from 2008.   Original Report Authenticated By: Davonna Belling, M.D.    Lab tests show low sodium.  Will call [facility] to admit her for frequent falls, low sodium.    1. Frequent falls   2. Hyponatremia   3. Chronic subdural hematoma   4. Dementia   5. Dyslipidemia            Carleene Cooper  III, MD 10/06/12 2041

## 2012-10-06 NOTE — H&P (Signed)
[facility] History and Physical  Katherine Miranda ZOX:[id number] DOB: 12-Dec-1920 DOA: 10/06/2012  Referring physician: ER physician PCP: Lupita Raider, MD   Chief Complaint: fall  HPI:  79 year old female with past medical history of hypertension, dyslipidemia, dementia who presented to [facility] ED status post fall at home. Patient is not a good historian as she has history of dementia but per ED report patient has fallen few times at home most recently few days prior to this admission. Patient apparently fell while walking with the walker. There was no reports of loss of consciousness but apparently family has noticed that the patient has become more confused since the last fall. Patient is able to answer yes or no but no reliable history can be obtained from her.  In ED, patient vital sign were stable with O2 saturation of 95%, BP 113/57 and HR 58 - 68. CT head showed chronic subdural hygroma similar to prior studies few years ago. CXR showed emphysema but no acute cardiopulmonary process. Her AST and ALT were slightly above the normal limits 62/38. BMET revealed sodium of 127.  Assessment and Plan:  Principal Problem:   Fall - likely secondary to advanced dementia, weakness - CT head with no acute intracranial findings - check ethanol level, urinalysis and urine culture - PT/OT evaluation Active Problems:   Chronic subdural hygroma - stable since 2008   Dementia - continue donepezil   Elevated transaminases - check ethanol level   Hyponatremia - secondary to dehydration - start IV fluids normal saline @ 75 cc/hr   HTN (hypertension) - continue norvasc and Hctz   Dyslipidemia - continue omega 3  Manson Passey [facility] [phone]   Review of Systems:  Unable to obtain ROS due to patient's dementia  Past Medical History  Diagnosis Date  . Hypertension   . Dementia    Past Surgical History  Procedure Laterality Date  . Knee arthroscopy     Social History:  reports that  she does not drink alcohol or use illicit drugs. Her tobacco history is not on file.  No Known Allergies  Family History:   Unable to obtain due to patient's dementia    Prior to Admission medications   Medication Sig Start Date End Date Taking? Authorizing Provider  alendronate (FOSAMAX) 70 MG tablet Take 70 mg by mouth every 7 (seven) days. Take with a full glass of water on an empty stomach.  On wednesdays   Yes Historical Provider, MD  amLODipine (NORVASC) 5 MG tablet Take 5 mg by mouth at bedtime.    Yes Historical Provider, MD  Ascorbic Acid (VITAMIN C PO) Take 1 tablet by mouth 2 (two) times daily.    Yes Historical Provider, MD  aspirin 325 MG tablet Take 325 mg by mouth at bedtime.   Yes Historical Provider, MD  CALCIUM PO Take 1 tablet by mouth 2 (two) times daily.    Yes Historical Provider, MD  cetirizine (ZYRTEC) 10 MG tablet Take 10 mg by mouth at bedtime.   Yes Historical Provider, MD  Cholecalciferol (VITAMIN D-3 PO) Take 1 tablet by mouth 2 (two) times daily.   Yes Historical Provider, MD  COENZYME Q-10 PO Take 1 tablet by mouth every morning.   Yes Historical Provider, MD  CRANBERRY PO Take 1 capsule by mouth 2 (two) times daily.   Yes Historical Provider, MD  donepezil (ARICEPT) 10 MG tablet Take 10 mg by mouth at bedtime.   Yes Historical Provider, MD  hydrochlorothiazide (HYDRODIURIL) 25  MG tablet Take 25 mg by mouth daily.   Yes Historical Provider, MD  iron polysaccharides (NIFEREX) 150 MG capsule Take 150 mg by mouth daily.   Yes Historical Provider, MD  Omega-3 Fatty Acids (FISH OIL PO) Take 1 capsule by mouth 2 (two) times daily.   Yes Historical Provider, MD  timolol (BETIMOL) 0.5 % ophthalmic solution Place 1 drop into the right eye daily.    Yes Historical Provider, MD  travoprost, benzalkonium, (TRAVATAN) 0.004 % ophthalmic solution Place 1 drop into both eyes at bedtime.   Yes Historical Provider, MD   Physical Exam: Filed Vitals:   10/06/12 1709 10/06/12  1815  BP: 113/57 157/64  Pulse: 68 58  Temp: 98.1 F (36.7 C)   TempSrc: Oral   Resp: 16 22  Height: 5\' 2"  (1.575 m)   Weight: 69.854 kg (154 lb)   SpO2: 96% 95%    Physical Exam  Constitutional: Appears well-developed and well-nourished. No distress.  HENT: Normocephalic dry mucus membranes Eyes: Conjunctivae and EOM are normal. PERRLA, no scleral icterus.  Neck: Normal ROM. Neck supple. No JVD. No tracheal deviation. No thyromegaly.  CVS: RRR, S1/S2 +, no murmurs, no gallops, no carotid bruit.  Pulmonary: Effort and breath sounds normal, no stridor, rhonchi, wheezes, rales.  Abdominal: Soft. BS +,  no distension, tenderness, rebound or guarding.  Musculoskeletal: No edema and no tenderness.  Lymphadenopathy: No lymphadenopathy noted, cervical, inguinal. Neuro: Alert. Normal reflexes, no focal neurologic deficits Skin: Skin is warm and dry. Bruises in neck and forearm area    Labs on Admission:  Basic Metabolic Panel:  Recent Labs Lab 10/06/12 1816  NA 127*  K 3.7  CL 84*  CO2 32  GLUCOSE 149*  BUN 19  CREATININE 0.99  CALCIUM 10.0   Liver Function Tests:  Recent Labs Lab 10/06/12 1816  AST 62*  ALT 38*  ALKPHOS 93  BILITOT 0.6  PROT 7.4  ALBUMIN 3.7   No results found for this basename: LIPASE, AMYLASE,  in the last 168 hours No results found for this basename: AMMONIA,  in the last 168 hours CBC:  Recent Labs Lab 10/06/12 1816  WBC 10.0  NEUTROABS 8.3*  HGB 13.8  HCT 38.1  MCV 84.9  PLT 275   Cardiac Enzymes: No results found for this basename: CKTOTAL, CKMB, CKMBINDEX, TROPONINI,  in the last 168 hours BNP: No components found with this basename: POCBNP,  CBG: No results found for this basename: GLUCAP,  in the last 168 hours  Radiological Exams on Admission: Dg Chest 2 View 10/06/2012  * IMPRESSION:  1.  Mild cardiomegaly without failure. 2.  Emphysema. 3.  No acute cardiopulmonary disease.   Original Report Authenticated By: Marin Roberts, M.D.    Ct Head W Wo Contrast 10/06/2012  IMPRESSION: Moderate atrophy and chronic microvascular ischemic change. Ventricular enlargement on an ex vacuo basis. No acute intracranial findings.  Chronic subdural hygroma versus focal atrophy right posterior fossa, unchanged from 2008.   Original Report Authenticated By: Davonna Belling, M.D.     Time spent: 75 minutes

## 2012-10-06 NOTE — ED Notes (Addendum)
Patient fell 2 days ago slid off bed then in kitchen while walking with a walker last fall walking in hallway.  Patient more lethargic and more confused more then normal. Patient history of dementia.  Blood work taken and results today sodium 124. Sent to ED for further evaluation. Patient has ecchymosis left upper extremity and left upper back from multiple falls.

## 2012-10-06 NOTE — Progress Notes (Signed)

## 2012-10-07 ENCOUNTER — Encounter (HOSPITAL_COMMUNITY): Payer: Self-pay | Admitting: *Deleted

## 2012-10-07 DIAGNOSIS — I10 Essential (primary) hypertension: Secondary | ICD-10-CM | POA: Diagnosis not present

## 2012-10-07 DIAGNOSIS — E871 Hypo-osmolality and hyponatremia: Secondary | ICD-10-CM | POA: Diagnosis not present

## 2012-10-07 DIAGNOSIS — Z9181 History of falling: Secondary | ICD-10-CM | POA: Diagnosis not present

## 2012-10-07 DIAGNOSIS — N390 Urinary tract infection, site not specified: Principal | ICD-10-CM

## 2012-10-07 LAB — COMPREHENSIVE METABOLIC PANEL
ALT: 29 U/L (ref 0–35)
AST: 37 U/L (ref 0–37)
Albumin: 3.1 g/dL — ABNORMAL LOW (ref 3.5–5.2)
Alkaline Phosphatase: 63 U/L (ref 39–117)
BUN: 27 mg/dL — ABNORMAL HIGH (ref 6–23)
Calcium: 8.5 mg/dL (ref 8.4–10.5)
Calcium: 8.8 mg/dL (ref 8.4–10.5)
Creatinine, Ser: 1.08 mg/dL (ref 0.50–1.10)
GFR calc Af Amer: 51 mL/min — ABNORMAL LOW (ref >90)
Glucose, Bld: 150 mg/dL — ABNORMAL HIGH (ref 70–99)
Potassium: 3 mEq/L — ABNORMAL LOW (ref 3.5–5.1)
Potassium: 3.3 mEq/L — ABNORMAL LOW (ref 3.5–5.1)
Sodium: 129 mEq/L — ABNORMAL LOW (ref 135–145)
Total Protein: 5.7 g/dL — ABNORMAL LOW (ref 6.0–8.3)
Total Protein: 6.1 g/dL (ref 6.0–8.3)

## 2012-10-07 LAB — URINE MICROSCOPIC-ADD ON

## 2012-10-07 LAB — APTT: aPTT: 28 seconds (ref 24–37)

## 2012-10-07 LAB — PHOSPHORUS: Phosphorus: 4 mg/dL (ref 2.3–4.6)

## 2012-10-07 LAB — URINALYSIS, ROUTINE W REFLEX MICROSCOPIC
Glucose, UA: NEGATIVE mg/dL
Ketones, ur: NEGATIVE mg/dL
Protein, ur: 100 mg/dL — AB
Urobilinogen, UA: 0.2 mg/dL (ref 0.0–1.0)

## 2012-10-07 LAB — GLUCOSE, CAPILLARY: Glucose-Capillary: 101 mg/dL — ABNORMAL HIGH (ref 70–99)

## 2012-10-07 LAB — CBC
HCT: 31.2 % — ABNORMAL LOW (ref 36.0–46.0)
Hemoglobin: 11.1 g/dL — ABNORMAL LOW (ref 12.0–15.0)
MCH: 30.5 pg (ref 26.0–34.0)
MCHC: 35.6 g/dL (ref 30.0–36.0)
MCV: 85.7 fL (ref 78.0–100.0)
RDW: 14.7 % (ref 11.5–15.5)

## 2012-10-07 LAB — PROTIME-INR
INR: 1.05 (ref 0.00–1.49)
Prothrombin Time: 13.6 seconds (ref 11.6–15.2)

## 2012-10-07 LAB — MAGNESIUM: Magnesium: 1.8 mg/dL (ref 1.5–2.5)

## 2012-10-07 MED ORDER — ENOXAPARIN SODIUM 40 MG/0.4ML ~~LOC~~ SOLN
40.0000 mg | Freq: Every day | SUBCUTANEOUS | Status: DC
  Administered 2012-10-07 – 2012-10-10 (×4): 40 mg via SUBCUTANEOUS
  Filled 2012-10-07 (×4): qty 0.4

## 2012-10-07 MED ORDER — DEXTROSE 5 % IV SOLN
1.0000 g | INTRAVENOUS | Status: DC
  Administered 2012-10-07 – 2012-10-10 (×4): 1 g via INTRAVENOUS
  Filled 2012-10-07 (×4): qty 10

## 2012-10-07 MED ORDER — POTASSIUM CHLORIDE CRYS ER 20 MEQ PO TBCR
40.0000 meq | EXTENDED_RELEASE_TABLET | Freq: Once | ORAL | Status: AC
  Administered 2012-10-07: 40 meq via ORAL
  Filled 2012-10-07: qty 2

## 2012-10-07 MED ORDER — AMLODIPINE BESYLATE 10 MG PO TABS
10.0000 mg | ORAL_TABLET | Freq: Every day | ORAL | Status: DC
  Administered 2012-10-07 – 2012-10-09 (×3): 10 mg via ORAL
  Filled 2012-10-07 (×4): qty 1

## 2012-10-07 NOTE — Care Management Note (Signed)
    Page 1 of 1   10/10/2012     4:17:46 PM   CARE MANAGEMENT NOTE 10/10/2012  Patient:  ADAM, DEMARY   Account Number:  [id number]  Date Initiated:  10/07/2012  Documentation initiated by:  Letha Cape  Subjective/Objective Assessment:   dx fall, uti  admit- lives alone     Action/Plan:   pt eval- rec snf   Anticipated DC Date:  10/10/2012   Anticipated DC Plan:  SKILLED NURSING FACILITY  In-house referral  Clinical Social Worker      DC Planning Services  CM consult      Choice offered to / List presented to:             Status of service:  Completed, signed off Medicare Important Message given?   (If response is "NO", the following Medicare IM given date fields will be blank) Date Medicare IM given:   Date Additional Medicare IM given:    Discharge Disposition:  SKILLED NURSING FACILITY  Per UR Regulation:  Reviewed for med. necessity/level of care/duration of stay  If discussed at Long Length of Stay Meetings, dates discussed:    Comments:  10/10/12 16:16 Letha Cape RN, BSN [phone] patient for dc to snf today, CSW following.

## 2012-10-07 NOTE — Progress Notes (Signed)
PATIENT DETAILS Name: Katherine Miranda Age: 79 y.o. Sex: female Date of Birth: [birth year] Admit Date: 10/06/2012 Admitting Physician Alison Murray, MD ZOX:[id number], MD  Subjective: No major issues overnight  Assessment/Plan: Principal Problem:   Fall  -suspect this is multifactorial-secondary to perhaps dehydration from HCTZ, UTI and ?orthostatics -CT head negative for any acute abnormalities  Active Problems: UTI -patient symptomatic and complaining of dysuria -start empiric IV Rocephin -await Urine cs  Hyponatremia -2/2 to HCTZ/Dehydration -gently hydrate -recheck Na in am  HTN -controlled -continue Amlodipine-but increase to 10 mg-as we will stop HCTZ given hyponatremia  Hypokalemia -likely 2/2 HCTZ  -replete and recheck in am   Subdural hygroma -stable-CT head done this admission-no changes   Dementia -awake and alert this am -c/w Aricept  Disposition: Remain inpatient  DVT Prophylaxis: Prophylactic Lovenox   Code Status: Full code   Family Communication None at bedside  Procedures:  None  CONSULTS:  None   MEDICATIONS: Scheduled Meds: . amLODipine  5 mg Oral QHS  . amLODipine  5 mg Oral Once  . aspirin  325 mg Oral QHS  . cefTRIAXone (ROCEPHIN) IVPB 1 gram/50 mL D5W  1 g Intravenous Q24H  . donepezil  10 mg Oral QHS  . iron polysaccharides  150 mg Oral Daily  . loratadine  10 mg Oral Daily  . omega-3 acid ethyl esters  1 g Oral Daily  . sodium chloride  3 mL Intravenous Q12H  . timolol  1 drop Right Eye Daily  . Travoprost (BAK Free)  1 drop Both Eyes QHS   Continuous Infusions: . sodium chloride 1,000 mL (10/07/12 0810)   PRN Meds:.acetaminophen, acetaminophen, HYDROcodone-acetaminophen, morphine injection, ondansetron (ZOFRAN) IV, ondansetron  Antibiotics: Anti-infectives   Start     Dose/Rate Route Frequency Ordered Stop   10/07/12 1000  cefTRIAXone (ROCEPHIN) 1 g in dextrose 5 % 50 mL IVPB     1 g 100 mL/hr over 30  Minutes Intravenous Every 24 hours 10/07/12 0918         PHYSICAL EXAM: Vital signs in last 24 hours: Filed Vitals:   10/06/12 1930 10/06/12 2015 10/06/12 2218 10/07/12 0500  BP: 158/61 160/60 164/76 144/60  Pulse: 62 68 79 90  Temp:   98.6 F (37 C) 98.5 F (36.9 C)  TempSrc:   Oral Oral  Resp: 23 24 18 20   Height:   5\' 2"  (1.575 m)   Weight:    69.4 kg (153 lb)  SpO2: 97% 95% 94% 92%    Weight change:  Filed Weights   10/06/12 1709 10/07/12 0500  Weight: 69.854 kg (154 lb) 69.4 kg (153 lb)   Body mass index is 27.98 kg/(m^2).   Gen Exam: Awake and alert with clear speech.   Neck: Supple, No JVD.   Chest: B/L Clear.   CVS: S1 S2 Regular, no murmurs.  Abdomen: soft, BS +, non tender, non distended.  Extremities: no edema, lower extremities warm to touch. Neurologic: Non Focal.   Skin: No Rash.   Wounds: N/A.    Intake/Output from previous day:  Intake/Output Summary (Last 24 hours) at 10/07/12 1320 Last data filed at 10/07/12 0900  Gross per 24 hour  Intake    360 ml  Output    150 ml  Net    210 ml     LAB RESULTS: CBC  Recent Labs Lab 10/06/12 1816 10/06/12 2340 10/07/12 0520  WBC 10.0 8.5 6.4  HGB 13.8 11.8* 11.1*  HCT 38.1 32.8*  31.2*  PLT 275 240 227  MCV 84.9 85.2 85.7  MCH 30.7 30.6 30.5  MCHC 36.2* 36.0 35.6  RDW 14.4 14.4 14.7  LYMPHSABS 0.8 1.3  --   MONOABS 0.9 0.7  --   EOSABS 0.1 0.1  --   BASOSABS 0.0 0.0  --     Chemistries   Recent Labs Lab 10/06/12 1816 10/06/12 2340 10/07/12 0520  NA 127* 128* 129*  K 3.7 3.3* 3.0*  CL 84* 89* 91*  CO2 32 30 29  GLUCOSE 149* 150* 102*  BUN 19 27* 24*  CREATININE 0.99 1.08 0.98  CALCIUM 10.0 8.5 8.8  MG  --  1.8  --     CBG:  Recent Labs Lab 10/07/12 0749  GLUCAP 101*    GFR Estimated Creatinine Clearance: 35.5 ml/min (by C-G formula based on Cr of 0.98).  Coagulation profile  Recent Labs Lab 10/06/12 2340  INR 1.05    Cardiac Enzymes No results found for  this basename: CK, CKMB, TROPONINI, MYOGLOBIN,  in the last 168 hours  No components found with this basename: POCBNP,  No results found for this basename: DDIMER,  in the last 72 hours No results found for this basename: HGBA1C,  in the last 72 hours No results found for this basename: CHOL, HDL, LDLCALC, TRIG, CHOLHDL, LDLDIRECT,  in the last 72 hours  Recent Labs  10/06/12 2340  TSH 3.265   No results found for this basename: VITAMINB12, FOLATE, FERRITIN, TIBC, IRON, RETICCTPCT,  in the last 72 hours No results found for this basename: LIPASE, AMYLASE,  in the last 72 hours  Urine Studies No results found for this basename: UACOL, UAPR, USPG, UPH, UTP, UGL, UKET, UBIL, UHGB, UNIT, UROB, ULEU, UEPI, UWBC, URBC, UBAC, CAST, CRYS, UCOM, BILUA,  in the last 72 hours  MICROBIOLOGY: No results found for this or any previous visit (from the past 240 hour(s)).  RADIOLOGY STUDIES/RESULTS: Dg Chest 2 View  10/06/2012  *RADIOLOGY REPORT*  Clinical Data: Status post fall.  CHEST - 2 VIEW  Comparison: Two-view chest 06/08/2012.  Findings: The heart is mildly enlarged.  There is no edema or effusion to suggest failure.  Mild emphysematous changes are again noted.  No focal airspace disease is evident.  Mild leftward curvature is present in the thoracic spine.  IMPRESSION:  1.  Mild cardiomegaly without failure. 2.  Emphysema. 3.  No acute cardiopulmonary disease.   Original Report Authenticated By: Marin Roberts, M.D.    Ct Head W Wo Contrast  10/06/2012  *RADIOLOGY REPORT*  Clinical Data: Right-sided numbness and weakness, possible stroke.  CT HEAD WITHOUT AND WITH CONTRAST  Technique:  Contiguous axial images were obtained from the base of the skull through the vertex without and with intravenous contrast.  Contrast: 75mL OMNIPAQUE IOHEXOL 300 MG/ML  SOLN  Comparison:  06/08/2012 CT.  11/23/2006 MR.  Findings: There is no evidence for acute infarction, intracranial hemorrhage, mass lesion,  hydrocephalus, or subdural hematoma. Small extra-axial fluid collection right posterior fossa relates to chronic hygroma/right cerebellar atrophy.  Moderate atrophy and moderate chronic microvascular ischemic change.   Ex vacuo ventricular enlargement without compelling features of normal pressure hydrocephalus, particularly given the white matter disease noted on prior MR 2008.  Calvarium intact.  Post infusion, no abnormal intracranial enhancement. Compared with prior studies, the white matter disease appears to have slightly progressed.  IMPRESSION: Moderate atrophy and chronic microvascular ischemic change. Ventricular enlargement on an ex vacuo basis. No acute intracranial findings.  Chronic subdural hygroma versus focal atrophy right posterior fossa, unchanged from 2008.   Original Report Authenticated By: Davonna Belling, M.D.     Jeoffrey Massed, MD  [facility] Pager:[phone]  If 7PM-7AM, please contact night-coverage [email or web address] Password [email or web address] 10/07/2012, 1:20 PM   LOS: 1 day

## 2012-10-07 NOTE — Clinical Social Work Placement (Addendum)
Clinical Social Work Department CLINICAL SOCIAL WORK PLACEMENT NOTE 10/07/2012  Patient:  Katherine Miranda, Katherine Miranda  Account Number:  [id number] Admit date:  10/06/2012  Clinical Social Worker:  Genelle Bal, LCSW  Date/time:  10/07/2012 06:14 AM  Clinical Social Work is seeking post-discharge placement for this patient at the following level of care:   SKILLED NURSING   (*CSW will update this form in Epic as items are completed)   10/07/2012  Patient/family provided with [facility] Department of Clinical Social Work's list of facilities offering this level of care within the geographic area requested by the patient (or if unable, by the patient's family).  10/07/2012  Patient/family informed of their freedom to choose among providers that offer the needed level of care, that participate in Medicare, Medicaid or managed care program needed by the patient, have an available bed and are willing to accept the patient.    Patient/family informed of [facility]' ownership interest in [facility], as well as of the fact that they are under no obligation to receive care at this facility.  PASARR submitted to EDS on 10/07/2012 PASARR number received from EDS on 10/07/2012 - [id number]  FL2 transmitted to all facilities in geographic area requested by pt/family on  10/07/2012 FL2 transmitted to all facilities within larger geographic area on   Patient informed that his/her managed care company has contracts with or will negotiate with  certain facilities, including the following:     Patient/family informed of bed offers received:  10/09/12 Patient chooses bed at  Physician recommends and patient chooses bed at    Patient to be transferred to  on   Patient to be transferred to facility by   The following physician request were entered in Epic:   Additional Comments: Only 2 bed offers on Sunday. Patient's daughter still would like [facility] or [facility]. Neither facility has  responded with or without offers as of Sunday afternoon. Backup choice may be [facility]. Weekday CSW to follow up with additional offers and assist with discharge. Please call daughter, Eliseo Gum at work, [phone], with discharge plans on Monday.  Ricke Hey, [place] [phone] (weekend)

## 2012-10-07 NOTE — Evaluation (Signed)
Physical Therapy Evaluation Patient Details Name: Katherine Miranda MRN: [id number] DOB: Apr 29, 1921 Today's Date: 10/07/2012 Time: 4098-1191 PT Time Calculation (min): 23 min  PT Assessment / Plan / Recommendation Clinical Impression  79 yo female admitted with falls found to have hyponatremia and abnormal urinalysis.  She also has LE weakness with recent eipisode of shingles and unstable gait. She will benefit from continued PT at SNF prior to d/c to home with intermittent assisance    PT Assessment  Patient needs continued PT services    Follow Up Recommendations  SNF    Does the patient have the potential to tolerate intense rehabilitation      Barriers to Discharge        Equipment Recommendations  None recommended by PT    Recommendations for Other Services     Frequency Min 3X/week    Precautions / Restrictions Precautions Precautions: Fall Restrictions Weight Bearing Restrictions: No   Pertinent Vitals/Pain No c/o pain      Mobility  Bed Mobility Bed Mobility: Supine to Sit Supine to Sit: 4: Min assist;HOB flat;With rails Details for Bed Mobility Assistance: pt has some difficulty scooting toward edge of bed Transfers Transfers: Stand to Sit Sit to Stand: 3: Mod assist;From bed;With upper extremity assist Stand to Sit: 3: Mod assist Details for Transfer Assistance: Pt with LOB posterior with standing. Pt tends to maintain partial flexion at knees. she can straighten knees, but cannot maintain for long Pt has difficulty sequencing stand to sit and is unable to control descent Ambulation/Gait Ambulation/Gait Assistance: 3: Mod assist Ambulation Distance (Feet): 50 Feet (20, 15, 15) Assistive device: Rolling walker Ambulation/Gait Assistance Details: pt needs assist for balance throughout gait.  She frequency has episodes of knees buckling and has trouble regaining stability Gait Pattern: Step-through pattern;Decreased step length - right;Decreased step length -  left;Trunk flexed;Right flexed knee in stance;Left flexed knee in stance Gait velocity: decreased General Gait Details: Pt has an unstable gait with problems with knee and trunk contol Stairs: No Wheelchair Mobility Wheelchair Mobility: No    Exercises Other Exercises Other Exercises: Repeated sit to stand with emphasis on controlled stand to sit Other Exercises: core activation   PT Diagnosis: Difficulty walking;Abnormality of gait;Generalized weakness  PT Problem List: Decreased strength;Decreased activity tolerance;Decreased balance;Decreased mobility;Decreased safety awareness PT Treatment Interventions: Gait training;Functional mobility training;Therapeutic activities;Therapeutic exercise;Balance training;Patient/family education   PT Goals Acute Rehab PT Goals PT Goal Formulation: With patient/family Time For Goal Achievement: 10/21/12 Potential to Achieve Goals: Good Pt will go Supine/Side to Sit: with supervision PT Goal: Supine/Side to Sit - Progress: Goal set today Pt will go Sit to Supine/Side: with supervision PT Goal: Sit to Supine/Side - Progress: Goal set today Pt will go Sit to Stand: with supervision PT Goal: Sit to Stand - Progress: Goal set today Pt will go Stand to Sit: with supervision PT Goal: Stand to Sit - Progress: Goal set today Pt will Stand: with supervision;3 - 5 min PT Goal: Stand - Progress: Goal set today Pt will Ambulate: >150 feet;with supervision;with least restrictive assistive device PT Goal: Ambulate - Progress: Goal set today  Visit Information  Last PT Received On: 10/07/12 Assistance Needed: +1    Subjective Data  Subjective: daughter reports pt had a severe case of shingles on left leg a few months ago Patient Stated Goal: to go back to her home    Prior Functioning  Home Living Lives With: Alone Available Help at Discharge: Family;Available PRN/intermittently Type of Home: House  Home Access: Stairs to enter Entrance  Stairs-Number of Steps: 3 Entrance Stairs-Rails: Right Home Layout: One level Bathroom Shower/Tub: Engineer, manufacturing systems: Standard Bathroom Accessibility: Yes Home Adaptive Equipment: Walker - rolling;Bedside commode/3-in-1;Shower chair with back Prior Function Level of Independence: Independent with assistive device(s);Needs assistance Needs Assistance: Bathing Bath: Minimal Able to Take Stairs?: Yes Driving: No Vocation: Retired Musician: No difficulties Dominant Hand: Right    Cognition  Cognition Arousal/Alertness: Awake/alert Behavior During Therapy: WFL for tasks assessed/performed Overall Cognitive Status: History of cognitive impairments - at baseline    Extremity/Trunk Assessment Right Upper Extremity Assessment RUE ROM/Strength/Tone: Within functional levels;WFL for tasks assessed RUE Sensation: WFL - Light Touch RUE Coordination: WFL - gross/fine motor Left Upper Extremity Assessment LUE ROM/Strength/Tone: Within functional levels;WFL for tasks assessed LUE Sensation: WFL - Light Touch LUE Coordination: WFL - gross/fine motor Right Lower Extremity Assessment RLE ROM/Strength/Tone: WFL for tasks assessed Left Lower Extremity Assessment LLE ROM/Strength/Tone: Deficits LLE ROM/Strength/Tone Deficits: Pt with some healing lesions on  medical distal thigh.  Weakness in hip flexion and knee extension ~ 4/5 Trunk Assessment Trunk Assessment: Normal   Balance Balance Balance Assessed: Yes Dynamic Sitting Balance Dynamic Sitting - Balance Support: No upper extremity supported Dynamic Sitting - Level of Assistance: 4: Min assist Dynamic Standing Balance Dynamic Standing - Balance Support: Right upper extremity supported;Left upper extremity supported Dynamic Standing - Level of Assistance: 3: Mod assist  End of Session PT - End of Session Equipment Utilized During Treatment: Gait belt Activity Tolerance: Patient tolerated treatment  well Patient left: in chair;with family/visitor present Nurse Communication: Mobility status  GP    Rosey Bath K. [place], [place] [phone] 10/07/2012, 1:11 PM

## 2012-10-07 NOTE — Evaluation (Signed)
Occupational Therapy Evaluation Patient Details Name: Katherine Miranda MRN: [id number] DOB: September 03, 1920 Today's Date: 10/07/2012 Time: 4098-1191 OT Time Calculation (min): 27 min  OT Assessment / Plan / Recommendation Clinical Impression  Pt is a pleasantly confused 79 yr old female admitted for history of falls.  Has history of SDH and dementia, now with hyponatremia.  Currently needs min to mod assist for LB selfcare and functional transfers.  Feel pt will benefit from acute care OT to help increased independence.  Feel she will also benefit from SNF for follow-up therapy.      OT Assessment  Patient needs continued OT Services    Follow Up Recommendations  SNF    Barriers to Discharge Decreased caregiver support    Equipment Recommendations  None recommended by OT       Frequency  Min 2X/week    Precautions / Restrictions Precautions Precautions: Fall Restrictions Weight Bearing Restrictions: No   Pertinent Vitals/Pain No report of pain    ADL  Eating/Feeding: Independent Where Assessed - Eating/Feeding: Chair Grooming: Performed;Minimal assistance Where Assessed - Grooming: Supported standing Upper Body Bathing: Simulated;Set up Where Assessed - Upper Body Bathing: Unsupported sitting Lower Body Bathing: Simulated;Moderate assistance Where Assessed - Lower Body Bathing: Supported sit to stand Upper Body Dressing: Simulated;Set up Where Assessed - Upper Body Dressing: Unsupported sitting Lower Body Dressing: Performed;Moderate assistance Where Assessed - Lower Body Dressing: Supported sit to Pharmacist, hospital: Performed;Moderate assistance Toilet Transfer Method: Other (comment) (ambulate with RW) Toilet Transfer Equipment: Raised toilet seat with arms (or 3-in-1 over toilet) Toileting - Clothing Manipulation and Hygiene: Performed;Moderate assistance Where Assessed - Toileting Clothing Manipulation and Hygiene: Sit to stand from 3-in-1 or toilet Tub/Shower  Transfer Method: Not assessed Equipment Used: Rolling walker;Gait belt Transfers/Ambulation Related to ADLs: Pt mod assist for mobility using the RW.  Pt with frequent LOB posteriorly during mobility and needing max instructional cueing to utilize the walker correctly and stay closer inside of it. ADL Comments: Pt 's daughter present for session and reports that they are trying to arrange for 24 hour care but that it may be more beneficial for her to go to rehab before coming home.  Agree that she will benefit from SNF for follow-up therapy secondary to recent falls and history of cognitive impairment.      OT Diagnosis: Generalized weakness;Cognitive deficits  OT Problem List: Decreased strength;Impaired balance (sitting and/or standing);Decreased cognition;Decreased knowledge of use of DME or AE OT Treatment Interventions: Self-care/ADL training;Therapeutic activities;Patient/family education;DME and/or AE instruction;Balance training   OT Goals Acute Rehab OT Goals OT Goal Formulation: With patient Time For Goal Achievement: 10/21/12 Potential to Achieve Goals: Good ADL Goals Pt Will Perform Grooming: with supervision;Standing at sink ADL Goal: Grooming - Progress: Goal set today Pt Will Perform Lower Body Dressing: with supervision;Sit to stand from chair;Supported ADL Goal: Lower Body Dressing - Progress: Goal set today Pt Will Transfer to Toilet: with supervision;with DME;3-in-1 ADL Goal: Toilet Transfer - Progress: Goal set today Pt Will Perform Toileting - Clothing Manipulation: with supervision;Sitting on 3-in-1 or toilet;Standing ADL Goal: Toileting - Clothing Manipulation - Progress: Goal set today Pt Will Perform Toileting - Hygiene: with supervision;Sit to stand from 3-in-1/toilet ADL Goal: Toileting - Hygiene - Progress: Goal set today  Visit Information  Last OT Received On: 10/07/12 Assistance Needed: +1    Subjective Data  Subjective: I can go to the bathroom.    Prior Functioning     Home Living Lives With: Alone Available Help  at Discharge: Family;Available PRN/intermittently Type of Home: House Home Access: Stairs to enter [employer] of Steps: 3 Entrance Stairs-Rails: Right Home Layout: One level Bathroom Shower/Tub: Engineer, manufacturing systems: Standard Bathroom Accessibility: Yes Home Adaptive Equipment: Walker - rolling;Bedside commode/3-in-1;Shower chair with back Prior Function Level of Independence: Independent with assistive device(s);Needs assistance Needs Assistance: Bathing Bath: Minimal Able to Take Stairs?: Yes Driving: No Vocation: Retired Musician: No difficulties Dominant Hand: Right         Vision/Perception Vision - History Baseline Vision: Wears glasses all the time Patient Visual Report: No change from baseline Vision - Assessment Eye Alignment: Within Functional Limits Vision Assessment: Vision not tested Perception Perception: Within Functional Limits Praxis Praxis: Intact   Cognition  Cognition Arousal/Alertness: Awake/alert Behavior During Therapy: WFL for tasks assessed/performed Overall Cognitive Status: History of cognitive impairments - at baseline    Extremity/Trunk Assessment Right Upper Extremity Assessment RUE ROM/Strength/Tone: Within functional levels;WFL for tasks assessed RUE Sensation: WFL - Light Touch RUE Coordination: WFL - gross/fine motor Left Upper Extremity Assessment LUE ROM/Strength/Tone: Within functional levels;WFL for tasks assessed LUE Sensation: WFL - Light Touch LUE Coordination: WFL - gross/fine motor Trunk Assessment Trunk Assessment: Normal     Mobility Bed Mobility Bed Mobility: Supine to Sit Supine to Sit: 4: Min assist;HOB flat;With rails Transfers Transfers: Sit to Stand Sit to Stand: 3: Mod assist;From bed;With upper extremity assist Details for Transfer Assistance: Pt with LOB posterior with standing.         Balance Balance Balance Assessed: Yes Dynamic Sitting Balance Dynamic Sitting - Balance Support: No upper extremity supported Dynamic Sitting - Level of Assistance: 4: Min assist Dynamic Standing Balance Dynamic Standing - Balance Support: Right upper extremity supported;Left upper extremity supported Dynamic Standing - Level of Assistance: 3: Mod assist   End of Session OT - End of Session Equipment Utilized During Treatment: Gait belt Activity Tolerance: Patient tolerated treatment well Patient left: in chair;with call bell/phone within reach;with family/visitor present     Jacalyn Biggs OTR/L Pager number [phone] 10/07/2012, 12:58 PM

## 2012-10-07 NOTE — Clinical Social Work Psychosocial (Signed)
Clinical Social Work Department BRIEF PSYCHOSOCIAL ASSESSMENT 10/07/2012  Patient:  Katherine Miranda, Katherine Miranda     Account Number:  [id number]     Admit date:  10/06/2012  Clinical Social Worker:  Delmer Islam  Date/Time:  10/07/2012 01:09 AM  Referred by:  RN  Date Referred:  10/07/2012 Referred for  SNF Placement   Other Referral:   Interview type:  Patient Other interview type:   CSW also talked with daughter Eliseo Gum who was at the bedside. [facility] [phone] and ([phone]    PSYCHOSOCIAL DATA Living Status:  ALONE Admitted from facility:   Level of care:   Primary support name:  Eliseo Gum Primary support relationship to patient:  CHILD, ADULT Degree of support available:   Daughter very supportive    CURRENT CONCERNS Current Concerns  Post-Acute Placement   Other Concerns:    SOCIAL WORK ASSESSMENT / PLAN CSW notified by nurse that PT had evaaluated patient and recommended SNF and daughter in the room. CSW introducded self and stated purpose of visit. Daughter unsure of discharge date and initially stated they would need to think about it, but as we continured to talk patient and daughter are in agreement with ST rehab. [name].    CSW gave daughter SNF list for [facility] and asked that they think about a 2nd choice. CSW explained bed search process and advised that they will be contacted regarding facility responses.   Assessment/plan status:  Psychosocial Support/Ongoing Assessment of Needs Other assessment/ plan:   Information/referral to community resources:   Daughter given SNF list for [facility]    PATIENT'S/FAMILY'S RESPONSE TO PLAN OF CARE: Daughter and patient open to speaking with CSW and in agreement with ST rehab.

## 2012-10-08 DIAGNOSIS — I10 Essential (primary) hypertension: Secondary | ICD-10-CM | POA: Diagnosis not present

## 2012-10-08 DIAGNOSIS — F0390 Unspecified dementia without behavioral disturbance: Secondary | ICD-10-CM | POA: Diagnosis not present

## 2012-10-08 DIAGNOSIS — E871 Hypo-osmolality and hyponatremia: Secondary | ICD-10-CM | POA: Diagnosis not present

## 2012-10-08 DIAGNOSIS — Z9181 History of falling: Secondary | ICD-10-CM | POA: Diagnosis not present

## 2012-10-08 LAB — BASIC METABOLIC PANEL
CO2: 31 mEq/L (ref 19–32)
Calcium: 8.6 mg/dL (ref 8.4–10.5)
Chloride: 99 mEq/L (ref 96–112)
Sodium: 137 mEq/L (ref 135–145)

## 2012-10-08 LAB — GLUCOSE, CAPILLARY: Glucose-Capillary: 117 mg/dL — ABNORMAL HIGH (ref 70–99)

## 2012-10-08 NOTE — Progress Notes (Signed)
Admitted pt.to 5524,from ED. For frequent falls.She lives at home alone with home health aid.Pt.is alert ,oriented x2. & with bruises to arms & legs,& skin tear on lt.elbow.Oriented pt.to the room & instructed to call the nurse for any help. Pt.care guide booklet given to pt. & family. & in pt.armband placed on patient after verifying with pt.Will continue to monitor pt.Marland KitchenMarland KitchenSonia Museum/gallery curator.

## 2012-10-08 NOTE — Progress Notes (Signed)
PATIENT DETAILS Name: Katherine Miranda Age: 79 y.o. Sex: female Date of Birth: 09/13/1920 Admit Date: 10/06/2012 Admitting Physician Alison Murray, MD EXB:[id number], MD  Subjective: No major issues overnight  Assessment/Plan: Principal Problem:   Fall  -suspect this is multifactorial-secondary to perhaps dehydration from HCTZ, UTI and ?orthostatics -CT head negative for any acute abnormalities -seen by PT-SNF on discharge  Active Problems: UTI -patient symptomatic and complaining of dysuria -c/w empiric IV Rocephin -await Urine cs  Hyponatremia -2/2 to HCTZ/Dehydration -Na has normalized after Hydration  HTN -controlled-but fluctuating-stop IVF today and re-evaluate to see if we need to add a second agent -continue Amlodipine 10 mg -have stopped HCTZ given hyponatremia  Hypokalemia -likely 2/2 HCTZ  -K 3.5   Subdural hygroma -stable-CT head done this admission-no changes   Dementia -awake and alert this am -c/w Aricept  Disposition: Remain inpatient  DVT Prophylaxis: Prophylactic Lovenox   Code Status: Full code   Family Communication None at bedside  Procedures:  None  CONSULTS:  None   MEDICATIONS: Scheduled Meds: . amLODipine  10 mg Oral QHS  . aspirin  325 mg Oral QHS  . cefTRIAXone (ROCEPHIN) IVPB 1 gram/50 mL D5W  1 g Intravenous Q24H  . donepezil  10 mg Oral QHS  . enoxaparin (LOVENOX) injection  40 mg Subcutaneous Daily  . iron polysaccharides  150 mg Oral Daily  . loratadine  10 mg Oral Daily  . omega-3 acid ethyl esters  1 g Oral Daily  . sodium chloride  3 mL Intravenous Q12H  . timolol  1 drop Right Eye Daily  . Travoprost (BAK Free)  1 drop Both Eyes QHS   Continuous Infusions: . sodium chloride 50 mL/hr (10/08/12 0532)   PRN Meds:.acetaminophen, acetaminophen, HYDROcodone-acetaminophen, morphine injection, ondansetron (ZOFRAN) IV, ondansetron  Antibiotics: Anti-infectives   Start     Dose/Rate Route Frequency  Ordered Stop   10/07/12 1000  cefTRIAXone (ROCEPHIN) 1 g in dextrose 5 % 50 mL IVPB     1 g 100 mL/hr over 30 Minutes Intravenous Every 24 hours 10/07/12 0918         PHYSICAL EXAM: Vital signs in last 24 hours: Filed Vitals:   10/07/12 0500 10/07/12 1552 10/07/12 2042 10/08/12 0608  BP: 144/60 146/72 154/77 160/74  Pulse: 90 88 65 68  Temp: 98.5 F (36.9 C) 98.6 F (37 C) 98.2 F (36.8 C) 98.6 F (37 C)  TempSrc: Oral Oral Oral Oral  Resp: 20 18 20 18   Height:      Weight: 69.4 kg (153 lb)   62.188 kg (137 lb 1.6 oz)  SpO2: 92% 94% 94% 91%    Weight change: -7.666 kg (-16 lb 14.4 oz) Filed Weights   10/06/12 1709 10/07/12 0500 10/08/12 0608  Weight: 69.854 kg (154 lb) 69.4 kg (153 lb) 62.188 kg (137 lb 1.6 oz)   Body mass index is 25.07 kg/(m^2).   Gen Exam: Awake and alert with clear speech.   Neck: Supple, No JVD.   Chest: B/L Clear.   CVS: S1 S2 Regular, no murmurs.  Abdomen: soft, BS +, non tender, non distended.  Extremities: no edema, lower extremities warm to touch. Neurologic: Non Focal.   Skin: No Rash.   Wounds: N/A.    Intake/Output from previous day:  Intake/Output Summary (Last 24 hours) at 10/08/12 1112 Last data filed at 10/08/12 0857  Gross per 24 hour  Intake 1887.5 ml  Output   1200 ml  Net  687.5 ml  LAB RESULTS: CBC  Recent Labs Lab 10/06/12 1816 10/06/12 2340 10/07/12 0520  WBC 10.0 8.5 6.4  HGB 13.8 11.8* 11.1*  HCT 38.1 32.8* 31.2*  PLT 275 240 227  MCV 84.9 85.2 85.7  MCH 30.7 30.6 30.5  MCHC 36.2* 36.0 35.6  RDW 14.4 14.4 14.7  LYMPHSABS 0.8 1.3  --   MONOABS 0.9 0.7  --   EOSABS 0.1 0.1  --   BASOSABS 0.0 0.0  --     Chemistries   Recent Labs Lab 10/06/12 1816 10/06/12 2340 10/07/12 0520 10/08/12 0410  NA 127* 128* 129* 137  K 3.7 3.3* 3.0* 3.5  CL 84* 89* 91* 99  CO2 32 30 29 31   GLUCOSE 149* 150* 102* 108*  BUN 19 27* 24* 19  CREATININE 0.99 1.08 0.98 0.78  CALCIUM 10.0 8.5 8.8 8.6  MG  --  1.8   --   --     CBG:  Recent Labs Lab 10/07/12 0749 10/08/12 0857  GLUCAP 101* 117*    GFR Estimated Creatinine Clearance: 41.3 ml/min (by C-G formula based on Cr of 0.78).  Coagulation profile  Recent Labs Lab 10/06/12 2340  INR 1.05    Cardiac Enzymes No results found for this basename: CK, CKMB, TROPONINI, MYOGLOBIN,  in the last 168 hours  No components found with this basename: POCBNP,  No results found for this basename: DDIMER,  in the last 72 hours No results found for this basename: HGBA1C,  in the last 72 hours No results found for this basename: CHOL, HDL, LDLCALC, TRIG, CHOLHDL, LDLDIRECT,  in the last 72 hours  Recent Labs  10/06/12 2340  TSH 3.265   No results found for this basename: VITAMINB12, FOLATE, FERRITIN, TIBC, IRON, RETICCTPCT,  in the last 72 hours No results found for this basename: LIPASE, AMYLASE,  in the last 72 hours  Urine Studies No results found for this basename: UACOL, UAPR, USPG, UPH, UTP, UGL, UKET, UBIL, UHGB, UNIT, UROB, ULEU, UEPI, UWBC, URBC, UBAC, CAST, CRYS, UCOM, BILUA,  in the last 72 hours  MICROBIOLOGY: Recent Results (from the past 240 hour(s))  URINE CULTURE     Status: None   Collection Time    10/07/12  5:58 AM      Result Value Range Status   Specimen Description URINE, CLEAN CATCH   Final   Special Requests NONE   Final   Culture  Setup Time 10/07/2012 06:20   Final   Colony Count >=100,000 COLONIES/ML   Final   Culture ESCHERICHIA COLI   Final   Report Status PENDING   Incomplete    RADIOLOGY STUDIES/RESULTS: Dg Chest 2 View  10/06/2012  *RADIOLOGY REPORT*  Clinical Data: Status post fall.  CHEST - 2 VIEW  Comparison: Two-view chest 06/08/2012.  Findings: The heart is mildly enlarged.  There is no edema or effusion to suggest failure.  Mild emphysematous changes are again noted.  No focal airspace disease is evident.  Mild leftward curvature is present in the thoracic spine.  IMPRESSION:  1.  Mild cardiomegaly  without failure. 2.  Emphysema. 3.  No acute cardiopulmonary disease.   Original Report Authenticated By: Marin Roberts, M.D.    Ct Head W Wo Contrast  10/06/2012  *RADIOLOGY REPORT*  Clinical Data: Right-sided numbness and weakness, possible stroke.  CT HEAD WITHOUT AND WITH CONTRAST  Technique:  Contiguous axial images were obtained from the base of the skull through the vertex without and with intravenous contrast.  Contrast:  75mL OMNIPAQUE IOHEXOL 300 MG/ML  SOLN  Comparison:  06/08/2012 CT.  11/23/2006 MR.  Findings: There is no evidence for acute infarction, intracranial hemorrhage, mass lesion, hydrocephalus, or subdural hematoma. Small extra-axial fluid collection right posterior fossa relates to chronic hygroma/right cerebellar atrophy.  Moderate atrophy and moderate chronic microvascular ischemic change.   Ex vacuo ventricular enlargement without compelling features of normal pressure hydrocephalus, particularly given the white matter disease noted on prior MR 2008.  Calvarium intact.  Post infusion, no abnormal intracranial enhancement. Compared with prior studies, the white matter disease appears to have slightly progressed.  IMPRESSION: Moderate atrophy and chronic microvascular ischemic change. Ventricular enlargement on an ex vacuo basis. No acute intracranial findings.  Chronic subdural hygroma versus focal atrophy right posterior fossa, unchanged from 2008.   Original Report Authenticated By: Davonna Belling, M.D.     Jeoffrey Massed, MD  [facility] Pager:[phone]  If 7PM-7AM, please contact night-coverage [email or web address] Password [email or web address] 10/08/2012, 11:12 AM   LOS: 2 days

## 2012-10-09 DIAGNOSIS — I10 Essential (primary) hypertension: Secondary | ICD-10-CM | POA: Diagnosis not present

## 2012-10-09 DIAGNOSIS — F0390 Unspecified dementia without behavioral disturbance: Secondary | ICD-10-CM | POA: Diagnosis not present

## 2012-10-09 DIAGNOSIS — Z9181 History of falling: Secondary | ICD-10-CM | POA: Diagnosis not present

## 2012-10-09 DIAGNOSIS — E871 Hypo-osmolality and hyponatremia: Secondary | ICD-10-CM | POA: Diagnosis not present

## 2012-10-09 LAB — URINE CULTURE: Colony Count: >=100000

## 2012-10-09 LAB — GLUCOSE, CAPILLARY: Glucose-Capillary: 103 mg/dL — ABNORMAL HIGH (ref 70–99)

## 2012-10-09 NOTE — Progress Notes (Signed)
PATIENT DETAILS Name: Katherine Miranda Age: 79 y.o. Sex: female Date of Birth: [birth year] Admit Date: 10/06/2012 Admitting Physician Alison Murray, MD ZOX:[id number], MD  Subjective: No major issues overnight-stable and without any complaints  Assessment/Plan: Principal Problem:   Fall  -suspect this is multifactorial-secondary to perhaps dehydration from HCTZ, UTI and ?orthostatics -CT head negative for any acute abnormalities -seen by PT-SNF on discharge  Active Problems: UTI -patient symptomatic and complaining of dysuria -c/w empiric IV Rocephin -await Urine cs-prelim culture-E Coli-awaiting sensitivity  Hyponatremia -2/2 to HCTZ/Dehydration -Na has normalized after Hydration  HTN -controlled-but fluctuating-stopped IVF 5/10-BP better off IVF-no need to add a second agent -continue Amlodipine 10 mg -have stopped HCTZ given hyponatremia  Hypokalemia -likely 2/2 HCTZ  -K 3.5   Subdural hygroma -stable-CT head done this admission-no changes   Dementia -awake and alert this am -c/w Aricept  Disposition: Remain inpatient-SNF 5/12  DVT Prophylaxis: Prophylactic Lovenox   Code Status: Full code   Family Communication Duaghter on 5/10 and 5/11  Procedures:  None  CONSULTS:  None   MEDICATIONS: Scheduled Meds: . amLODipine  10 mg Oral QHS  . aspirin  325 mg Oral QHS  . cefTRIAXone (ROCEPHIN) IVPB 1 gram/50 mL D5W  1 g Intravenous Q24H  . donepezil  10 mg Oral QHS  . enoxaparin (LOVENOX) injection  40 mg Subcutaneous Daily  . iron polysaccharides  150 mg Oral Daily  . loratadine  10 mg Oral Daily  . omega-3 acid ethyl esters  1 g Oral Daily  . sodium chloride  3 mL Intravenous Q12H  . timolol  1 drop Right Eye Daily  . Travoprost (BAK Free)  1 drop Both Eyes QHS   Continuous Infusions:   PRN Meds:.acetaminophen, acetaminophen, HYDROcodone-acetaminophen, ondansetron (ZOFRAN) IV, ondansetron  Antibiotics: Anti-infectives   Start      Dose/Rate Route Frequency Ordered Stop   10/07/12 1000  cefTRIAXone (ROCEPHIN) 1 g in dextrose 5 % 50 mL IVPB     1 g 100 mL/hr over 30 Minutes Intravenous Every 24 hours 10/07/12 0918         PHYSICAL EXAM: Vital signs in last 24 hours: Filed Vitals:   10/08/12 0608 10/08/12 1435 10/08/12 2226 10/09/12 0548  BP: 160/74 143/58 129/69 139/43  Pulse: 68 55 61 87  Temp: 98.6 F (37 C) 98.7 F (37.1 C) 98 F (36.7 C) 97.8 F (36.6 C)  TempSrc: Oral Oral Oral Oral  Resp: 18 18 20 19   Height:      Weight: 62.188 kg (137 lb 1.6 oz)     SpO2: 91% 96% 95% 98%    Weight change:  Filed Weights   10/06/12 1709 10/07/12 0500 10/08/12 0608  Weight: 69.854 kg (154 lb) 69.4 kg (153 lb) 62.188 kg (137 lb 1.6 oz)   Body mass index is 25.07 kg/(m^2).   Gen Exam: Awake and alert with clear speech.   Neck: Supple, No JVD.   Chest: B/L Clear.   CVS: S1 S2 Regular, no murmurs.  Abdomen: soft, BS +, non tender, non distended.  Extremities: no edema, lower extremities warm to touch. Neurologic: Non Focal.   Skin: No Rash.   Wounds: N/A.    Intake/Output from previous day:  Intake/Output Summary (Last 24 hours) at 10/09/12 1037 Last data filed at 10/09/12 0550  Gross per 24 hour  Intake      0 ml  Output    200 ml  Net   -200 ml  LAB RESULTS: CBC  Recent Labs Lab 10/06/12 1816 10/06/12 2340 10/07/12 0520  WBC 10.0 8.5 6.4  HGB 13.8 11.8* 11.1*  HCT 38.1 32.8* 31.2*  PLT 275 240 227  MCV 84.9 85.2 85.7  MCH 30.7 30.6 30.5  MCHC 36.2* 36.0 35.6  RDW 14.4 14.4 14.7  LYMPHSABS 0.8 1.3  --   MONOABS 0.9 0.7  --   EOSABS 0.1 0.1  --   BASOSABS 0.0 0.0  --     Chemistries   Recent Labs Lab 10/06/12 1816 10/06/12 2340 10/07/12 0520 10/08/12 0410  NA 127* 128* 129* 137  K 3.7 3.3* 3.0* 3.5  CL 84* 89* 91* 99  CO2 32 30 29 31   GLUCOSE 149* 150* 102* 108*  BUN 19 27* 24* 19  CREATININE 0.99 1.08 0.98 0.78  CALCIUM 10.0 8.5 8.8 8.6  MG  --  1.8  --   --      CBG:  Recent Labs Lab 10/07/12 0749 10/08/12 0857 10/09/12 0759  GLUCAP 101* 117* 103*    GFR Estimated Creatinine Clearance: 41.3 ml/min (by C-G formula based on Cr of 0.78).  Coagulation profile  Recent Labs Lab 10/06/12 2340  INR 1.05    Cardiac Enzymes No results found for this basename: CK, CKMB, TROPONINI, MYOGLOBIN,  in the last 168 hours  No components found with this basename: POCBNP,  No results found for this basename: DDIMER,  in the last 72 hours No results found for this basename: HGBA1C,  in the last 72 hours No results found for this basename: CHOL, HDL, LDLCALC, TRIG, CHOLHDL, LDLDIRECT,  in the last 72 hours  Recent Labs  10/06/12 2340  TSH 3.265   No results found for this basename: VITAMINB12, FOLATE, FERRITIN, TIBC, IRON, RETICCTPCT,  in the last 72 hours No results found for this basename: LIPASE, AMYLASE,  in the last 72 hours  Urine Studies No results found for this basename: UACOL, UAPR, USPG, UPH, UTP, UGL, UKET, UBIL, UHGB, UNIT, UROB, ULEU, UEPI, UWBC, URBC, UBAC, CAST, CRYS, UCOM, BILUA,  in the last 72 hours  MICROBIOLOGY: Recent Results (from the past 240 hour(s))  URINE CULTURE     Status: None   Collection Time    10/07/12  5:58 AM      Result Value Range Status   Specimen Description URINE, CLEAN CATCH   Final   Special Requests NONE   Final   Culture  Setup Time 10/07/2012 06:20   Final   Colony Count >=100,000 COLONIES/ML   Final   Culture ESCHERICHIA COLI   Final   Report Status 10/09/2012 FINAL   Final   Organism ID, Bacteria ESCHERICHIA COLI   Final    RADIOLOGY STUDIES/RESULTS: Dg Chest 2 View  10/06/2012  *RADIOLOGY REPORT*  Clinical Data: Status post fall.  CHEST - 2 VIEW  Comparison: Two-view chest 06/08/2012.  Findings: The heart is mildly enlarged.  There is no edema or effusion to suggest failure.  Mild emphysematous changes are again noted.  No focal airspace disease is evident.  Mild leftward curvature is  present in the thoracic spine.  IMPRESSION:  1.  Mild cardiomegaly without failure. 2.  Emphysema. 3.  No acute cardiopulmonary disease.   Original Report Authenticated By: Marin Roberts, M.D.    Ct Head W Wo Contrast  10/06/2012  *RADIOLOGY REPORT*  Clinical Data: Right-sided numbness and weakness, possible stroke.  CT HEAD WITHOUT AND WITH CONTRAST  Technique:  Contiguous axial images were obtained from the  base of the skull through the vertex without and with intravenous contrast.  Contrast: 75mL OMNIPAQUE IOHEXOL 300 MG/ML  SOLN  Comparison:  06/08/2012 CT.  11/23/2006 MR.  Findings: There is no evidence for acute infarction, intracranial hemorrhage, mass lesion, hydrocephalus, or subdural hematoma. Small extra-axial fluid collection right posterior fossa relates to chronic hygroma/right cerebellar atrophy.  Moderate atrophy and moderate chronic microvascular ischemic change.   Ex vacuo ventricular enlargement without compelling features of normal pressure hydrocephalus, particularly given the white matter disease noted on prior MR 2008.  Calvarium intact.  Post infusion, no abnormal intracranial enhancement. Compared with prior studies, the white matter disease appears to have slightly progressed.  IMPRESSION: Moderate atrophy and chronic microvascular ischemic change. Ventricular enlargement on an ex vacuo basis. No acute intracranial findings.  Chronic subdural hygroma versus focal atrophy right posterior fossa, unchanged from 2008.   Original Report Authenticated By: Davonna Belling, M.D.     Jeoffrey Massed, MD  [facility] Pager:[phone]  If 7PM-7AM, please contact night-coverage [email or web address] Password [facility] 10/09/2012, 10:37 AM   LOS: 3 days

## 2012-10-10 DIAGNOSIS — I10 Essential (primary) hypertension: Secondary | ICD-10-CM | POA: Diagnosis not present

## 2012-10-10 DIAGNOSIS — E559 Vitamin D deficiency, unspecified: Secondary | ICD-10-CM | POA: Diagnosis not present

## 2012-10-10 DIAGNOSIS — E039 Hypothyroidism, unspecified: Secondary | ICD-10-CM | POA: Diagnosis not present

## 2012-10-10 DIAGNOSIS — R279 Unspecified lack of coordination: Secondary | ICD-10-CM | POA: Diagnosis not present

## 2012-10-10 DIAGNOSIS — D649 Anemia, unspecified: Secondary | ICD-10-CM | POA: Diagnosis not present

## 2012-10-10 DIAGNOSIS — F0390 Unspecified dementia without behavioral disturbance: Secondary | ICD-10-CM | POA: Diagnosis not present

## 2012-10-10 DIAGNOSIS — N390 Urinary tract infection, site not specified: Secondary | ICD-10-CM | POA: Diagnosis not present

## 2012-10-10 DIAGNOSIS — R609 Edema, unspecified: Secondary | ICD-10-CM | POA: Diagnosis not present

## 2012-10-10 DIAGNOSIS — E871 Hypo-osmolality and hyponatremia: Secondary | ICD-10-CM | POA: Diagnosis not present

## 2012-10-10 DIAGNOSIS — M6281 Muscle weakness (generalized): Secondary | ICD-10-CM | POA: Diagnosis not present

## 2012-10-10 DIAGNOSIS — D181 Lymphangioma, any site: Secondary | ICD-10-CM | POA: Diagnosis not present

## 2012-10-10 DIAGNOSIS — R55 Syncope and collapse: Secondary | ICD-10-CM | POA: Diagnosis not present

## 2012-10-10 DIAGNOSIS — R627 Adult failure to thrive: Secondary | ICD-10-CM | POA: Diagnosis not present

## 2012-10-10 DIAGNOSIS — E785 Hyperlipidemia, unspecified: Secondary | ICD-10-CM | POA: Diagnosis not present

## 2012-10-10 DIAGNOSIS — Z9181 History of falling: Secondary | ICD-10-CM | POA: Diagnosis not present

## 2012-10-10 LAB — GLUCOSE, CAPILLARY

## 2012-10-10 MED ORDER — CIPROFLOXACIN HCL 750 MG PO TABS: 750.0000 mg | ORAL_TABLET | Freq: Two times a day (BID) | ORAL | Status: DC

## 2012-10-10 MED ORDER — AMLODIPINE BESYLATE 5 MG PO TABS: 10.0000 mg | ORAL_TABLET | Freq: Every day | ORAL | Status: DC

## 2012-10-10 MED ORDER — ACETAMINOPHEN 325 MG PO TABS: 650.0000 mg | ORAL_TABLET | Freq: Four times a day (QID) | ORAL | Status: DC | PRN

## 2012-10-10 NOTE — Progress Notes (Signed)
Katherine Miranda to be D/C'd Rehab per MD order. Patients skin is clean, dry and intact no evidence of skin break down.  Patient has Left elbow laceration that was protected by foam dressing.  Patient with scattered bruising to  Bilateral arms, back and legs and left neck.  IV site discontinued and catheter remains intact. Site without signs and symptoms of complications. Dressing and pressure applied. Patient Dressed and sitting in chair awaiting ambulance transportation. [name] 10/10/2012 4:47 PM

## 2012-10-10 NOTE — Discharge Summary (Signed)
Physician Discharge Summary  MATTESON BLUE ZOX:[id number] DOB: 26-Feb-1921 DOA: 10/06/2012  PCP: Lupita Raider, MD  Admit date: 10/06/2012 Discharge date: 10/10/2012  Time spent:  50 minutes  Recommendations for Outpatient Follow-up:  - Follow up with PCP in 4-6 weeks - Will receive physical therapy at SNF - check chemistries periodically  Discharge Diagnoses:  Principal Problem:   Fall Active Problems:   Subdural hygroma   Dementia   Hyponatremia   HTN (hypertension)   Dyslipidemia   Discharge Condition: Good  Diet recommendation: No resitrictions  Filed Weights   10/07/12 0500 10/08/12 0608 10/10/12 0500  Weight: 69.4 kg (153 lb) 62.188 kg (137 lb 1.6 oz) 66.7 kg (147 lb 0.8 oz)    History of present illness:  PT came to the hospital after having multiple falls believed to be second to multifactorial- second to dehydration from HCTZ, UTI.   Hospital Course:  1. Fall -suspect this is multifactorial-secondary to perhaps dehydration from HCTZ, UTI and ?orthostatics  -HCTZ was discontinued and her orthostatics were negative for orthostatic hypotension -CT head negative for any acute abnormalities  -seen by PT-recommended Discharge to SNF   2. Hyponatremia --Secondary to HCTZ/Dehydration  -Na has normalized after Hydration -would not re-challenge with HCTZ again  3. UTI -patient symptomatic and complaining of dysuria  - Urine cs- culture-E Coli-Sensitive to Rocephin-has completed 4 days of Rocephin here in the hospital-do not think she needs further antibiotics-therefore will not be discharged on any  4. Hypokalemia  -likely Secondary to HCTZ  -this was repleted  5. HTN (hypertension) - have stopped HCTZ given hyponatremia - continue Amlodipine 10 mg-BP better-- no need to add a second agent at time of discharge  6. Subdural hygroma  -stable-CT head done this admission-no changes  7. Dementia - awake and alert  - c/w  Aricept  Procedures:  None  Consultations:  None  Discharge Exam: Filed Vitals:   10/09/12 0548 10/09/12 1442 10/09/12 2151 10/10/12 0500  BP: 139/43 152/57 163/72 139/68  Pulse: 87 65 59 56  Temp: 97.8 F (36.6 C) 98.1 F (36.7 C) 98.2 F (36.8 C) 98.2 F (36.8 C)  TempSrc: Oral Oral Oral Oral  Resp: 19 20 18 20   Height:      Weight:    66.7 kg (147 lb 0.8 oz)  SpO2: 98% 100% 93% 96%    General: WBWN in no acute distress. Alert and awake. With clear speech Cardiovascular: RRR without MGR Respiratory: CTA B with no accessory muscle use Abdomen: soft nontender, nondistended, with Bs+, no masses felt Extremities: Full ROM in extremities with no edema present Neuro: Non focal grossly intact  Discharge Instructions      Discharge Orders   Future Orders Complete By Expires     Diet - low sodium heart healthy  As directed     Diet - low sodium heart healthy  As directed     Diet - low sodium heart healthy  As directed     Increase activity slowly  As directed     Increase activity slowly  As directed     Increase activity slowly  As directed         Medication List    STOP taking these medications       hydrochlorothiazide 25 MG tablet  Commonly known as:  HYDRODIURIL      TAKE these medications       acetaminophen 325 MG tablet  Commonly known as:  TYLENOL  Take 2 tablets (650  mg total) by mouth every 6 (six) hours as needed.     alendronate 70 MG tablet  Commonly known as:  FOSAMAX  Take 70 mg by mouth every 7 (seven) days. Take with a full glass of water on an empty stomach.    On wednesdays     amLODipine 5 MG tablet  Commonly known as:  NORVASC  Take 2 tablets (10 mg total) by mouth at bedtime.     aspirin 325 MG tablet  Take 325 mg by mouth at bedtime.     CALCIUM PO  Take 1 tablet by mouth 2 (two) times daily.     cetirizine 10 MG tablet  Commonly known as:  ZYRTEC  Take 10 mg by mouth at bedtime.     COENZYME Q-10 PO  Take 1  tablet by mouth every morning.     CRANBERRY PO  Take 1 capsule by mouth 2 (two) times daily.     donepezil 10 MG tablet  Commonly known as:  ARICEPT  Take 10 mg by mouth at bedtime.     FISH OIL PO  Take 1 capsule by mouth 2 (two) times daily.     iron polysaccharides 150 MG capsule  Commonly known as:  NIFEREX  Take 150 mg by mouth daily.     timolol 0.5 % ophthalmic solution  Commonly known as:  BETIMOL  Place 1 drop into the right eye daily.     travoprost (benzalkonium) 0.004 % ophthalmic solution  Commonly known as:  TRAVATAN  Place 1 drop into both eyes at bedtime.     VITAMIN C PO  Take 1 tablet by mouth 2 (two) times daily.     VITAMIN D-3 PO  Take 1 tablet by mouth 2 (two) times daily.       No Known Allergies    The results of significant diagnostics from this hospitalization (including imaging, microbiology, ancillary and laboratory) are listed below for reference.    Significant Diagnostic Studies: Dg Chest 2 View  10/06/2012  *RADIOLOGY REPORT*  Clinical Data: Status post fall.  CHEST - 2 VIEW  Comparison: Two-view chest 06/08/2012.  Findings: The heart is mildly enlarged.  There is no edema or effusion to suggest failure.  Mild emphysematous changes are again noted.  No focal airspace disease is evident.  Mild leftward curvature is present in the thoracic spine.  IMPRESSION:  1.  Mild cardiomegaly without failure. 2.  Emphysema. 3.  No acute cardiopulmonary disease.   Original Report Authenticated By: Marin Roberts, M.D.    Ct Head W Wo Contrast  10/06/2012  *RADIOLOGY REPORT*  Clinical Data: Right-sided numbness and weakness, possible stroke.  CT HEAD WITHOUT AND WITH CONTRAST  Technique:  Contiguous axial images were obtained from the base of the skull through the vertex without and with intravenous contrast.  Contrast: 75mL OMNIPAQUE IOHEXOL 300 MG/ML  SOLN  Comparison:  06/08/2012 CT.  11/23/2006 MR.  Findings: There is no evidence for acute  infarction, intracranial hemorrhage, mass lesion, hydrocephalus, or subdural hematoma. Small extra-axial fluid collection right posterior fossa relates to chronic hygroma/right cerebellar atrophy.  Moderate atrophy and moderate chronic microvascular ischemic change.   Ex vacuo ventricular enlargement without compelling features of normal pressure hydrocephalus, particularly given the white matter disease noted on prior MR 2008.  Calvarium intact.  Post infusion, no abnormal intracranial enhancement. Compared with prior studies, the white matter disease appears to have slightly progressed.  IMPRESSION: Moderate atrophy and chronic microvascular ischemic change. Ventricular  enlargement on an ex vacuo basis. No acute intracranial findings.  Chronic subdural hygroma versus focal atrophy right posterior fossa, unchanged from 2008.   Original Report Authenticated By: Davonna Belling, M.D.     Microbiology: Recent Results (from the past 240 hour(s))  URINE CULTURE     Status: None   Collection Time    10/07/12  5:58 AM      Result Value Range Status   Specimen Description URINE, CLEAN CATCH   Final   Special Requests NONE   Final   Culture  Setup Time 10/07/2012 06:20   Final   Colony Count >=100,000 COLONIES/ML   Final   Culture ESCHERICHIA COLI   Final   Report Status 10/09/2012 FINAL   Final   Organism ID, Bacteria ESCHERICHIA COLI   Final     Labs: Basic Metabolic Panel:  Recent Labs Lab 10/06/12 1816 10/06/12 2340 10/07/12 0520 10/08/12 0410  NA 127* 128* 129* 137  K 3.7 3.3* 3.0* 3.5  CL 84* 89* 91* 99  CO2 32 30 29 31   GLUCOSE 149* 150* 102* 108*  BUN 19 27* 24* 19  CREATININE 0.99 1.08 0.98 0.78  CALCIUM 10.0 8.5 8.8 8.6  MG  --  1.8  --   --   PHOS  --  4.0  --   --    Liver Function Tests:  Recent Labs Lab 10/06/12 1816 10/06/12 2340 10/07/12 0520  AST 62* 46* 37  ALT 38* 33 29  ALKPHOS 93 69 63  BILITOT 0.6 0.4 0.5  PROT 7.4 6.1 5.7*  ALBUMIN 3.7 3.1* 2.9*    CBC:  Recent Labs Lab 10/06/12 1816 10/06/12 2340 10/07/12 0520  WBC 10.0 8.5 6.4  NEUTROABS 8.3* 6.4  --   HGB 13.8 11.8* 11.1*  HCT 38.1 32.8* 31.2*  MCV 84.9 85.2 85.7  PLT 275 240 227   CBG:  Recent Labs Lab 10/07/12 0749 10/08/12 0857 10/09/12 0759 10/10/12 0807  GLUCAP 101* 117* 103* 92   Signed: Cherlyn Labella PA-S Stephani Police PA-C  [facility] 10/10/2012, 10:10 AM   Attending Patient seen and examined, agree with the assessment and plan. Patient is stable for discharge. Do not think she needs any further antibiotics for her UTI. BP stable on Amlodipine.  S Ghimire

## 2012-10-10 NOTE — Progress Notes (Signed)
Report given to [facility]

## 2012-10-10 NOTE — Progress Notes (Signed)
Physical Therapy Treatment Patient Details Name: Katherine Miranda MRN: [id number] DOB: 01/28/1921 Today's Date: 10/10/2012 Time: 4098-1191 PT Time Calculation (min): 34 min  PT Assessment / Plan / Recommendation Comments on Treatment Session  Improvement noted in sit to stand and gait.  Pt still has some difficulty with control of stand to sit.  Pt able to cooperate well with PT with good endurance for exercise, transfer training and gait.  Ready to continue PT at SNF    Follow Up Recommendations  SNF     Does the patient have the potential to tolerate intense rehabilitation     Barriers to Discharge        Equipment Recommendations  None recommended by PT    Recommendations for Other Services    Frequency Min 3X/week   Plan Discharge plan remains appropriate;Frequency remains appropriate    Precautions / Restrictions Restrictions Weight Bearing Restrictions: No   Pertinent Vitals/Pain No c/o pain    Mobility  Bed Mobility Details for Bed Mobility Assistance: pt sitting up in chair per nursing Transfers Transfers: Stand to Sit Sit to Stand: From bed;With upper extremity assist;4: Min assist Stand to Sit: 4: Min assist Details for Transfer Assistance: Occasional cues for hand placement.  Repeated multiple times with emphasis on extending hips back and activating quads to control descent  Pt performed commode transfers for urination Ambulation/Gait Ambulation/Gait Assistance: 4: Min assist Ambulation Distance (Feet): 600 Feet (150 x 4 with sittting rest breaks) Assistive device: Rolling walker Ambulation/Gait Assistance Details: very frequent cues for trunk, hip and knees extension in gait.  Pt motivated and wants to continue to get stronger "this is good for me" Gait Pattern: Step-through pattern;Decreased step length - right;Decreased step length - left;Trunk flexed;Right flexed knee in stance;Left flexed knee in stance Gait velocity: decreased General Gait Details:  Improved gait since last treatment, but pt still needs frequent cueing.  Pt with decrease in trunk, hip and knee extension with fatigure Stairs: No Wheelchair Mobility Wheelchair Mobility: No    Exercises General Exercises - Lower Extremity Ankle Circles/Pumps: AROM;5 reps;Both;Seated Quad Sets: AROM;Both;Standing;10 reps Gluteal Sets: AROM;Both;10 reps;Standing Long Arc Quad: AROM;Both;10 reps;Seated Hip Flexion/Marching: AROM;Both;10 reps;Seated;Limitations;Standing Hip Flexion/Marching Limitations: pt has more difficulty with this on left LE Other Exercises Other Exercises: Repeated sit to stand with emphasis on controlled stand to sit Other Exercises: core activation   PT Diagnosis:    PT Problem List:   PT Treatment Interventions:     PT Goals Acute Rehab PT Goals PT Goal Formulation: With patient/family Time For Goal Achievement: 10/21/12 Potential to Achieve Goals: Good Pt will go Supine/Side to Sit: with supervision Pt will go Sit to Supine/Side: with supervision Pt will go Sit to Stand: with supervision PT Goal: Sit to Stand - Progress: Progressing toward goal Pt will go Stand to Sit: with supervision PT Goal: Stand to Sit - Progress: Progressing toward goal Pt will Stand: with supervision;3 - 5 min PT Goal: Stand - Progress: Progressing toward goal Pt will Ambulate: >150 feet;with supervision;with least restrictive assistive device PT Goal: Ambulate - Progress: Progressing toward goal  Visit Information  Last PT Received On: 10/10/12    Subjective Data  Subjective: OK...allright. Patient Stated Goal: to get stronger   Cognition  Cognition Arousal/Alertness: Awake/alert Behavior During Therapy: WFL for tasks assessed/performed Overall Cognitive Status: History of cognitive impairments - at baseline    Balance  Balance Balance Assessed: Yes Static Sitting Balance Static Sitting - Balance Support: No upper extremity supported;Feet supported  Static Sitting  - Level of Assistance: 5: Stand by assistance Static Standing Balance Static Standing - Level of Assistance: 5: Stand by assistance Static Standing - Comment/# of Minutes: pt standing at sink to wash hands after urination on commode.  Pt cues to extend hips to lean against sink for stability  End of Session PT - End of Session Equipment Utilized During Treatment: Gait belt Activity Tolerance: Patient tolerated treatment well Patient left: in chair;with family/visitor present Nurse Communication: Mobility status   GP    Bayard Hugger. Manson Passey, [place] [phone] 10/10/2012, 9:55 AM

## 2012-10-14 DIAGNOSIS — N390 Urinary tract infection, site not specified: Secondary | ICD-10-CM | POA: Diagnosis not present

## 2012-10-14 DIAGNOSIS — F0390 Unspecified dementia without behavioral disturbance: Secondary | ICD-10-CM | POA: Diagnosis not present

## 2012-10-14 DIAGNOSIS — R627 Adult failure to thrive: Secondary | ICD-10-CM | POA: Diagnosis not present

## 2012-10-14 DIAGNOSIS — I10 Essential (primary) hypertension: Secondary | ICD-10-CM | POA: Diagnosis not present

## 2012-10-30 DIAGNOSIS — R627 Adult failure to thrive: Secondary | ICD-10-CM | POA: Diagnosis not present

## 2012-10-30 DIAGNOSIS — I10 Essential (primary) hypertension: Secondary | ICD-10-CM | POA: Diagnosis not present

## 2012-10-30 DIAGNOSIS — Z9181 History of falling: Secondary | ICD-10-CM | POA: Diagnosis not present

## 2012-10-30 DIAGNOSIS — R55 Syncope and collapse: Secondary | ICD-10-CM | POA: Diagnosis not present

## 2012-10-30 DIAGNOSIS — F0390 Unspecified dementia without behavioral disturbance: Secondary | ICD-10-CM | POA: Diagnosis not present

## 2012-10-30 DIAGNOSIS — M6281 Muscle weakness (generalized): Secondary | ICD-10-CM | POA: Diagnosis not present

## 2012-10-30 DIAGNOSIS — R279 Unspecified lack of coordination: Secondary | ICD-10-CM | POA: Diagnosis not present

## 2012-10-30 DIAGNOSIS — D181 Lymphangioma, any site: Secondary | ICD-10-CM | POA: Diagnosis not present

## 2012-10-30 DIAGNOSIS — E871 Hypo-osmolality and hyponatremia: Secondary | ICD-10-CM | POA: Diagnosis not present

## 2012-10-30 DIAGNOSIS — R609 Edema, unspecified: Secondary | ICD-10-CM | POA: Diagnosis not present

## 2012-11-04 DIAGNOSIS — I10 Essential (primary) hypertension: Secondary | ICD-10-CM | POA: Diagnosis not present

## 2012-11-04 DIAGNOSIS — E871 Hypo-osmolality and hyponatremia: Secondary | ICD-10-CM | POA: Diagnosis not present

## 2012-11-04 DIAGNOSIS — R627 Adult failure to thrive: Secondary | ICD-10-CM | POA: Diagnosis not present

## 2012-11-04 DIAGNOSIS — F0390 Unspecified dementia without behavioral disturbance: Secondary | ICD-10-CM | POA: Diagnosis not present

## 2012-11-07 DIAGNOSIS — F0390 Unspecified dementia without behavioral disturbance: Secondary | ICD-10-CM | POA: Diagnosis not present

## 2012-11-07 DIAGNOSIS — I10 Essential (primary) hypertension: Secondary | ICD-10-CM | POA: Diagnosis not present

## 2012-11-07 DIAGNOSIS — R55 Syncope and collapse: Secondary | ICD-10-CM | POA: Diagnosis not present

## 2012-11-10 DIAGNOSIS — R609 Edema, unspecified: Secondary | ICD-10-CM | POA: Diagnosis not present

## 2012-11-10 DIAGNOSIS — I10 Essential (primary) hypertension: Secondary | ICD-10-CM | POA: Diagnosis not present

## 2012-11-10 DIAGNOSIS — F0390 Unspecified dementia without behavioral disturbance: Secondary | ICD-10-CM | POA: Diagnosis not present

## 2012-11-18 DIAGNOSIS — I10 Essential (primary) hypertension: Secondary | ICD-10-CM | POA: Diagnosis not present

## 2012-11-18 DIAGNOSIS — R350 Frequency of micturition: Secondary | ICD-10-CM | POA: Diagnosis not present

## 2012-11-18 DIAGNOSIS — I951 Orthostatic hypotension: Secondary | ICD-10-CM | POA: Diagnosis not present

## 2012-11-18 DIAGNOSIS — E871 Hypo-osmolality and hyponatremia: Secondary | ICD-10-CM | POA: Diagnosis not present

## 2012-11-18 DIAGNOSIS — F0390 Unspecified dementia without behavioral disturbance: Secondary | ICD-10-CM | POA: Diagnosis not present

## 2012-11-23 DIAGNOSIS — F0390 Unspecified dementia without behavioral disturbance: Secondary | ICD-10-CM | POA: Diagnosis not present

## 2012-11-23 DIAGNOSIS — M6281 Muscle weakness (generalized): Secondary | ICD-10-CM | POA: Diagnosis not present

## 2012-11-23 DIAGNOSIS — I10 Essential (primary) hypertension: Secondary | ICD-10-CM | POA: Diagnosis not present

## 2012-11-23 DIAGNOSIS — D649 Anemia, unspecified: Secondary | ICD-10-CM | POA: Diagnosis not present

## 2012-11-23 DIAGNOSIS — R269 Unspecified abnormalities of gait and mobility: Secondary | ICD-10-CM | POA: Diagnosis not present

## 2012-12-07 DIAGNOSIS — B351 Tinea unguium: Secondary | ICD-10-CM | POA: Diagnosis not present

## 2012-12-07 DIAGNOSIS — M79609 Pain in unspecified limb: Secondary | ICD-10-CM | POA: Diagnosis not present

## 2012-12-12 DIAGNOSIS — R404 Transient alteration of awareness: Secondary | ICD-10-CM | POA: Diagnosis not present

## 2012-12-12 DIAGNOSIS — R55 Syncope and collapse: Secondary | ICD-10-CM | POA: Diagnosis not present

## 2012-12-12 DIAGNOSIS — R5381 Other malaise: Secondary | ICD-10-CM | POA: Diagnosis not present

## 2012-12-17 ENCOUNTER — Encounter: Payer: Self-pay | Admitting: Interventional Cardiology

## 2012-12-17 ENCOUNTER — Encounter: Payer: Self-pay | Admitting: Internal Medicine

## 2012-12-17 DIAGNOSIS — R55 Syncope and collapse: Secondary | ICD-10-CM | POA: Diagnosis not present

## 2012-12-23 DIAGNOSIS — N179 Acute kidney failure, unspecified: Secondary | ICD-10-CM | POA: Diagnosis not present

## 2013-01-02 ENCOUNTER — Encounter: Payer: Self-pay | Admitting: *Deleted

## 2013-01-03 ENCOUNTER — Ambulatory Visit (INDEPENDENT_AMBULATORY_CARE_PROVIDER_SITE_OTHER): Payer: Medicare Other | Admitting: Internal Medicine

## 2013-01-03 ENCOUNTER — Encounter: Payer: Self-pay | Admitting: Internal Medicine

## 2013-01-03 VITALS — BP 162/80 | HR 56 | Ht 63.0 in | Wt 158.4 lb

## 2013-01-03 DIAGNOSIS — I482 Chronic atrial fibrillation, unspecified: Secondary | ICD-10-CM | POA: Insufficient documentation

## 2013-01-03 DIAGNOSIS — I4891 Unspecified atrial fibrillation: Secondary | ICD-10-CM | POA: Diagnosis not present

## 2013-01-03 MED ORDER — APIXABAN 2.5 MG PO TABS: 2.5000 mg | ORAL_TABLET | Freq: Two times a day (BID) | ORAL | Status: DC

## 2013-01-03 NOTE — Assessment & Plan Note (Signed)
The patient  Has atrial fibrillation which until now has not been documented.she has nocturnal posttermination pauses. Her history does not go along with symptomatic bradycardia. Her episodes of altered mentation do not favor bradycardia or for that matter sustained tachycardia. The patient needs systemic anticoagulation as her stroke risk is increased based on her advanced age. She is started today on Eliquis 2.5 mg twice daily. With regard to her pauses, I've recommended watchful waiting. Because her pauses are nocturnal, she does not have an indication for permanent pacemaker insertion, although I suspect she will ultimately require a pacemaker when she develops daytime pauses which we can document. While an echocardiogram would be a consideration, her advanced age makes me think that ultimately this will be fairly low yield. I recommended that she continue to wear a monitor, and would recommend pacemaker if she develops daytime pauses. She may ultimately require rate control and medication for her atrial fibrillation, and this would further increase her risk for symptomatic pauses. I cannot clearly correlate symptoms however and for this reason I would recommend watchful waiting.

## 2013-01-03 NOTE — Progress Notes (Signed)
HPI Katherine Miranda  Is referred today by Dr. Clelia Croft for evaluation of bradycardia in a history of remote altered mentation. The patient is a very pleasant 79 year old woman who looks younger than her stated age. Her health is otherwise been good except for mild hypertension. She has had 3 episodes of altered mentation, lasting for several minutes each. There was no warning. She did not lose postural tone. It's unclear as to whether she truly lost consciousness.  Subsequent evaluation to date has demonstrated that the patient has nocturnal bradycardia and atrial fibrillation. I cannot tell as she has had a 2-D echo. The patient is not systemically anticoagulated. She's never had frank syncope. She denies chest pain, shortness of breath, or peripheral edema. No Known Allergies   Current Outpatient Prescriptions  Medication Sig Dispense Refill  . alendronate (FOSAMAX) 70 MG tablet Take 70 mg by mouth every 7 (seven) days. Take with a full glass of water on an empty stomach.  On wednesdays      . amLODipine (NORVASC) 5 MG tablet Take 2 tablets (10 mg total) by mouth at bedtime.      . Ascorbic Acid (VITAMIN C PO) Take 1 tablet by mouth 2 (two) times daily.       Marland Kitchen aspirin 325 MG tablet Take 325 mg by mouth at bedtime.      Marland Kitchen aspirin 325 MG tablet Take 325 mg by mouth daily.      Marland Kitchen CALCIUM PO Take 1 tablet by mouth 2 (two) times daily.       . cetirizine (ZYRTEC) 10 MG tablet Take 10 mg by mouth at bedtime.      . Cholecalciferol (VITAMIN D-3 PO) Take 1 tablet by mouth 2 (two) times daily.      Marland Kitchen COENZYME Q-10 PO Take 1 tablet by mouth every morning.      Marland Kitchen CRANBERRY PO Take 1 capsule by mouth 2 (two) times daily.      Marland Kitchen donepezil (ARICEPT) 10 MG tablet Take 10 mg by mouth at bedtime.      . iron polysaccharides (NIFEREX) 150 MG capsule Take 150 mg by mouth daily.      . mirabegron ER (MYRBETRIQ) 25 MG TB24 tablet Take 25 mg by mouth daily.      . Omega-3 Fatty Acids (FISH OIL PO) Take 1 capsule by mouth  2 (two) times daily.      . timolol (BETIMOL) 0.5 % ophthalmic solution Place 1 drop into the right eye daily.       . travoprost, benzalkonium, (TRAVATAN) 0.004 % ophthalmic solution Place 1 drop into both eyes at bedtime.      Marland Kitchen apixaban (ELIQUIS) 2.5 MG TABS tablet Take 1 tablet (2.5 mg total) by mouth 2 (two) times daily.  60 tablet  11   No current facility-administered medications for this visit.     Past Medical History  Diagnosis Date  . Hypertension   . Dementia   . Arthritis     ROS:   All systems reviewed and negative except as noted in the HPI.   Past Surgical History  Procedure Laterality Date  . Knee arthroscopy    . Back surgery       No family history on file.   History   Social History  . Marital Status: Widowed    Spouse Name: N/A    Number of Children: N/A  . Years of Education: N/A   Occupational History  . Not on file.   Social  History Main Topics  . Smoking status: Never Smoker   . Smokeless tobacco: Never Used  . Alcohol Use: No  . Drug Use: No  . Sexually Active: No   Other Topics Concern  . Not on file   Social History Narrative  . No narrative on file     BP 162/80  Pulse 56  Ht 5\' 3"  (1.6 m)  Wt 158 lb 6.4 oz (71.85 kg)  BMI 28.07 kg/m2  Physical Exam:  Well appearing elderly woman, who looks much older than her stated age, NAD HEENT: Unremarkable Neck:  No JVD, no thyromegally Back:  No CVA tenderness Lungs:  Clear with no wheezes, rales, or rhonchi. HEART:  Regular rate rhythm, no murmurs, no rubs, no clicks Abd:  soft, positive bowel sounds, no organomegally, no rebound, no guarding Ext:  2 plus pulses, no edema, no cyanosis, no clubbing Skin:  No rashes no nodules Neuro:  CN II through XII intact, motor grossly intact  EKG - normal sinus rhythm, normal axis and intervals.  Cardiac monitor - nocturnal bradycardia with nocturnal pauses of 4 seconds, secondary to posttermination from atrial  fibrillation.  Assess/Plan:

## 2013-01-03 NOTE — Patient Instructions (Addendum)
Your physician recommends that you schedule a follow-up appointment in: 3 months with Dr Ladona Ridgel     Your physician has recommended you make the following change in your medication:  1) Start Eliquis 2.5mg  twice daily

## 2013-01-05 ENCOUNTER — Telehealth: Payer: Self-pay | Admitting: Internal Medicine

## 2013-01-05 NOTE — Telephone Encounter (Signed)
She is suppose to stop ASA

## 2013-01-05 NOTE — Telephone Encounter (Signed)
New Prob  Pt's daughter wants to now if mother should stop using the 325 Asprin for the blood thinner medication.Marland KitchenMarland KitchenShe just wants to make sure that she heard it right. She did stop using the Asprin

## 2013-01-05 NOTE — Telephone Encounter (Signed)
Follow up   Daughter Harriett Sine) asks that you call her at work [phone].

## 2013-01-12 ENCOUNTER — Telehealth: Payer: Self-pay | Admitting: Internal Medicine

## 2013-01-12 DIAGNOSIS — H4011X0 Primary open-angle glaucoma, stage unspecified: Secondary | ICD-10-CM | POA: Diagnosis not present

## 2013-01-12 DIAGNOSIS — H409 Unspecified glaucoma: Secondary | ICD-10-CM | POA: Diagnosis not present

## 2013-01-12 NOTE — Telephone Encounter (Signed)
New Prob  Pt daughter would like to speak with a nurse regarding an episode her mother had on Sunday. She said they received a call stating her mom heart rate was low and the pt's PCP would like that report is possible.

## 2013-01-12 NOTE — Telephone Encounter (Signed)
Left message for Katherine Miranda to return my call. °

## 2013-01-13 DIAGNOSIS — E782 Mixed hyperlipidemia: Secondary | ICD-10-CM | POA: Diagnosis not present

## 2013-01-13 DIAGNOSIS — M949 Disorder of cartilage, unspecified: Secondary | ICD-10-CM | POA: Diagnosis not present

## 2013-01-13 DIAGNOSIS — M159 Polyosteoarthritis, unspecified: Secondary | ICD-10-CM | POA: Diagnosis not present

## 2013-01-13 DIAGNOSIS — I10 Essential (primary) hypertension: Secondary | ICD-10-CM | POA: Diagnosis not present

## 2013-01-13 DIAGNOSIS — M899 Disorder of bone, unspecified: Secondary | ICD-10-CM | POA: Diagnosis not present

## 2013-01-13 DIAGNOSIS — I4891 Unspecified atrial fibrillation: Secondary | ICD-10-CM | POA: Diagnosis not present

## 2013-01-13 DIAGNOSIS — F0390 Unspecified dementia without behavioral disturbance: Secondary | ICD-10-CM | POA: Diagnosis not present

## 2013-01-17 NOTE — Telephone Encounter (Signed)
Had an appointment with Dr Clelia Croft on 01/13/13 and mailed the monitor back yesterday.  The daughter did get my message but did not return my call the next day.  We are awaiting the results of the monitor Dr Clelia Croft ordered

## 2013-01-23 DIAGNOSIS — R55 Syncope and collapse: Secondary | ICD-10-CM | POA: Diagnosis not present

## 2013-02-02 DIAGNOSIS — M899 Disorder of bone, unspecified: Secondary | ICD-10-CM | POA: Diagnosis not present

## 2013-02-06 ENCOUNTER — Institutional Professional Consult (permissible substitution): Payer: Medicare Other | Admitting: Internal Medicine

## 2013-03-08 ENCOUNTER — Ambulatory Visit: Payer: Self-pay | Admitting: Podiatry

## 2013-03-16 ENCOUNTER — Encounter: Payer: Self-pay | Admitting: Podiatry

## 2013-03-16 ENCOUNTER — Ambulatory Visit (INDEPENDENT_AMBULATORY_CARE_PROVIDER_SITE_OTHER): Payer: Medicare Other | Admitting: Podiatry

## 2013-03-16 VITALS — BP 177/87 | HR 61 | Resp 24 | Ht 65.0 in | Wt 158.0 lb

## 2013-03-16 DIAGNOSIS — M79609 Pain in unspecified limb: Secondary | ICD-10-CM | POA: Diagnosis not present

## 2013-03-16 DIAGNOSIS — B351 Tinea unguium: Secondary | ICD-10-CM

## 2013-03-16 NOTE — Progress Notes (Signed)
Subjective:     Patient ID: Katherine Miranda, female   DOB: 08-Sep-1920, 79 y.o.   MRN: [id number]  HPI please cut my toenails. They are painful and hard for me to take care   Review of Systems  All other systems reviewed and are negative.       Objective:   Physical Exam  Nursing note and vitals reviewed. Cardiovascular: Intact distal pulses.   Neurological: She is alert.   Elongated thick nails that are painful 1-5 bilateral    Assessment:     Mycotic nail infection 1-5 bilateral    Plan:     Debridement painful nailbeds 1-5 bilateral no iatrogenic bleeding noted

## 2013-03-17 DIAGNOSIS — Z23 Encounter for immunization: Secondary | ICD-10-CM | POA: Diagnosis not present

## 2013-04-13 ENCOUNTER — Encounter: Payer: Self-pay | Admitting: Internal Medicine

## 2013-04-13 ENCOUNTER — Ambulatory Visit (INDEPENDENT_AMBULATORY_CARE_PROVIDER_SITE_OTHER): Payer: Medicare Other | Admitting: Internal Medicine

## 2013-04-13 VITALS — BP 174/76 | HR 73 | Ht 64.0 in | Wt 157.1 lb

## 2013-04-13 DIAGNOSIS — I10 Essential (primary) hypertension: Secondary | ICD-10-CM | POA: Diagnosis not present

## 2013-04-13 DIAGNOSIS — I4891 Unspecified atrial fibrillation: Secondary | ICD-10-CM

## 2013-04-13 MED ORDER — CARVEDILOL 3.125 MG PO TABS: 3.1250 mg | ORAL_TABLET | Freq: Two times a day (BID) | ORAL | Status: DC

## 2013-04-13 NOTE — Assessment & Plan Note (Signed)
Her ventricular rate is well controlled. Hopefully it will not go too low with low dose beta blocker.

## 2013-04-13 NOTE — Assessment & Plan Note (Signed)
Her blood pressure is elevated. Hopefully will be better with initiation of coreg.

## 2013-04-13 NOTE — Progress Notes (Signed)
HPI Katherine Miranda returns today for followup. She is a pleasant elderly woman who looks younger than her stated age. She has a h/o chronic atrial fibrillation on Eliquis. In the interim she denies chest pain, sob, syncope or peripheral edema. The patient has been taking her blood pressure and it is typically high. No headache.  No Known Allergies   Current Outpatient Prescriptions  Medication Sig Dispense Refill  . alendronate (FOSAMAX) 70 MG tablet Take 70 mg by mouth every 7 (seven) days. Take with a full glass of water on an empty stomach.  On wednesdays      . amLODipine (NORVASC) 5 MG tablet Take 2 tablets (10 mg total) by mouth at bedtime.      Marland Kitchen apixaban (ELIQUIS) 2.5 MG TABS tablet Take 1 tablet (2.5 mg total) by mouth 2 (two) times daily.  60 tablet  11  . Ascorbic Acid (VITAMIN C) 100 MG tablet Take by mouth daily.      Marland Kitchen CALCIUM PO Take 1 tablet by mouth 2 (two) times daily.       . cetirizine (ZYRTEC) 10 MG tablet Take 10 mg by mouth at bedtime.      . Cholecalciferol (VITAMIN D-3 PO) Take 1 tablet by mouth 2 (two) times daily.      Marland Kitchen COENZYME Q-10 PO Take 1 tablet by mouth every morning.      Marland Kitchen CRANBERRY PO Take 1 capsule by mouth 2 (two) times daily.      Marland Kitchen donepezil (ARICEPT) 10 MG tablet Take 10 mg by mouth at bedtime.      . iron polysaccharides (NIFEREX) 150 MG capsule Take 150 mg by mouth daily.      . mirabegron ER (MYRBETRIQ) 25 MG TB24 tablet Take 25 mg by mouth daily.      . Omega-3 Fatty Acids (FISH OIL PO) Take 1 capsule by mouth 2 (two) times daily.      . timolol (BETIMOL) 0.5 % ophthalmic solution Place 1 drop into the right eye daily.       . travoprost, benzalkonium, (TRAVATAN) 0.004 % ophthalmic solution Place 1 drop into both eyes at bedtime.       No current facility-administered medications for this visit.     Past Medical History  Diagnosis Date  . Hypertension   . Dementia   . Arthritis     ROS:   All systems reviewed and negative  except as noted in the HPI.   Past Surgical History  Procedure Laterality Date  . Knee arthroscopy    . Back surgery       No family history on file.   History   Social History  . Marital Status: Widowed    Spouse Name: N/A    Number of Children: N/A  . Years of Education: N/A   Occupational History  . Not on file.   Social History Main Topics  . Smoking status: Never Smoker   . Smokeless tobacco: Never Used  . Alcohol Use: No  . Drug Use: No  . Sexual Activity: No   Other Topics Concern  . Not on file   Social History Narrative  . No narrative on file     BP 174/76  Pulse 73  Ht 5\' 4"  (1.626 m)  Wt 157 lb 1.9 oz (71.269 kg)  BMI 26.96 kg/m2  Physical Exam:  Well appearing elderly woman, NAD HEENT: Unremarkable Neck:  No JVD, no thyromegally Back:  No CVA tenderness  Lungs:  Clear with no wheezes, rales, or rhonchi HEART:  IRegular rate rhythm, no murmurs, no rubs, no clicks Abd:  soft, positive bowel sounds, no organomegally, no rebound, no guarding Ext:  2 plus pulses, no edema, no cyanosis, no clubbing Skin:  No rashes no nodules Neuro:  CN II through XII intact, motor grossly intact  Assess/Plan:

## 2013-04-13 NOTE — Patient Instructions (Signed)
Your physician wants you to follow-up in: 6 months with Dr Court Joy will receive a reminder letter in the mail two months in advance. If you don't receive a letter, please call our office to schedule the follow-up appointment.   Your physician has recommended you make the following change in your medication:  1) Start Carvedilol 3.125mg  twice daily

## 2013-04-17 ENCOUNTER — Other Ambulatory Visit: Payer: Self-pay | Admitting: *Deleted

## 2013-04-17 DIAGNOSIS — I4891 Unspecified atrial fibrillation: Secondary | ICD-10-CM

## 2013-04-17 MED ORDER — APIXABAN 2.5 MG PO TABS: 2.5000 mg | ORAL_TABLET | Freq: Two times a day (BID) | ORAL | Status: DC

## 2013-06-08 ENCOUNTER — Ambulatory Visit: Payer: Medicare Other | Admitting: Podiatry

## 2013-06-23 DIAGNOSIS — H409 Unspecified glaucoma: Secondary | ICD-10-CM | POA: Diagnosis not present

## 2013-06-23 DIAGNOSIS — H4011X0 Primary open-angle glaucoma, stage unspecified: Secondary | ICD-10-CM | POA: Diagnosis not present

## 2013-06-23 DIAGNOSIS — H2510 Age-related nuclear cataract, unspecified eye: Secondary | ICD-10-CM | POA: Diagnosis not present

## 2013-06-27 ENCOUNTER — Other Ambulatory Visit: Payer: Self-pay | Admitting: *Deleted

## 2013-06-27 DIAGNOSIS — I4891 Unspecified atrial fibrillation: Secondary | ICD-10-CM

## 2013-06-27 DIAGNOSIS — I10 Essential (primary) hypertension: Secondary | ICD-10-CM

## 2013-06-27 MED ORDER — APIXABAN 2.5 MG PO TABS: 2.5000 mg | ORAL_TABLET | Freq: Two times a day (BID) | ORAL | Status: DC

## 2013-06-27 MED ORDER — CARVEDILOL 3.125 MG PO TABS: 3.1250 mg | ORAL_TABLET | Freq: Two times a day (BID) | ORAL | Status: DC

## 2013-06-27 NOTE — Telephone Encounter (Signed)
Daughter called in for medications to be sent to mail order

## 2013-06-29 DIAGNOSIS — J069 Acute upper respiratory infection, unspecified: Secondary | ICD-10-CM | POA: Diagnosis not present

## 2013-06-29 DIAGNOSIS — R05 Cough: Secondary | ICD-10-CM | POA: Diagnosis not present

## 2013-06-29 DIAGNOSIS — R059 Cough, unspecified: Secondary | ICD-10-CM | POA: Diagnosis not present

## 2013-07-27 ENCOUNTER — Ambulatory Visit: Payer: Medicare Other | Admitting: Podiatry

## 2013-08-10 ENCOUNTER — Ambulatory Visit (INDEPENDENT_AMBULATORY_CARE_PROVIDER_SITE_OTHER): Payer: Medicare Other | Admitting: Podiatry

## 2013-08-10 ENCOUNTER — Encounter: Payer: Self-pay | Admitting: Podiatry

## 2013-08-10 DIAGNOSIS — B351 Tinea unguium: Secondary | ICD-10-CM

## 2013-08-10 DIAGNOSIS — M79609 Pain in unspecified limb: Secondary | ICD-10-CM | POA: Diagnosis not present

## 2013-08-11 ENCOUNTER — Telehealth: Payer: Self-pay | Admitting: Internal Medicine

## 2013-08-11 DIAGNOSIS — M899 Disorder of bone, unspecified: Secondary | ICD-10-CM | POA: Diagnosis not present

## 2013-08-11 DIAGNOSIS — Z0000 Encounter for general adult medical examination without abnormal findings: Secondary | ICD-10-CM | POA: Diagnosis not present

## 2013-08-11 DIAGNOSIS — I4891 Unspecified atrial fibrillation: Secondary | ICD-10-CM | POA: Diagnosis not present

## 2013-08-11 DIAGNOSIS — Z23 Encounter for immunization: Secondary | ICD-10-CM | POA: Diagnosis not present

## 2013-08-11 DIAGNOSIS — R82998 Other abnormal findings in urine: Secondary | ICD-10-CM | POA: Diagnosis not present

## 2013-08-11 DIAGNOSIS — E782 Mixed hyperlipidemia: Secondary | ICD-10-CM | POA: Diagnosis not present

## 2013-08-11 DIAGNOSIS — M949 Disorder of cartilage, unspecified: Secondary | ICD-10-CM | POA: Diagnosis not present

## 2013-08-11 DIAGNOSIS — M159 Polyosteoarthritis, unspecified: Secondary | ICD-10-CM | POA: Diagnosis not present

## 2013-08-11 DIAGNOSIS — I10 Essential (primary) hypertension: Secondary | ICD-10-CM | POA: Diagnosis not present

## 2013-08-11 DIAGNOSIS — F0390 Unspecified dementia without behavioral disturbance: Secondary | ICD-10-CM | POA: Diagnosis not present

## 2013-08-11 NOTE — Telephone Encounter (Signed)
Daughter calls to schedule 6 month appointment with Dr. Lovena Le from Nov/2014  States pt bp is running about the same as it was in November 834-196 systolic. Pt is on Carvedilol 3.125mg  bid States pt saw pcp Dr. Brigitte Pulse & had suggested increasing Carvedilol to 6.25 mg bid but wanted Dr. Lovena Le to review.  Offered appt for 08/16/13 with Dr. Lovena Le. Daughter refused stating bp was running about the same as it always has & asymptomatic. Wanted to make a late April appt. Appt made.  Horton Chin RN

## 2013-08-11 NOTE — Telephone Encounter (Signed)
New problem    Pt's daughter needs a call back about when this pt should follow and ck on the new med.  PCP will contact Dr Lovena Le abut med and pt's BP being a little low.

## 2013-08-11 NOTE — Telephone Encounter (Signed)
Spoke with husband who asked me to call daughter's cell number Called & received voicemail. [email or web address] Horton Chin RN

## 2013-08-12 NOTE — Progress Notes (Signed)
Subjective:     Patient ID: Katherine Miranda, female   DOB: 20-Jun-1920, 80 y.o.   MRN: [id number]  HPI patient presents with thick painful nailbeds 1-5 both feet that she cannot cut   Review of Systems     Objective:   Physical Exam Note neurovascular status intact with friable painful thick brittle nails 1-5 both feet    Assessment:     Mycotic nail infection with pain 1-5 both feet    Plan:     Debridement painful nail bed 1-5 both feet with no bleeding noted

## 2013-08-16 ENCOUNTER — Ambulatory Visit: Payer: Medicare Other | Admitting: Internal Medicine

## 2013-08-18 DIAGNOSIS — E875 Hyperkalemia: Secondary | ICD-10-CM | POA: Diagnosis not present

## 2013-09-15 ENCOUNTER — Other Ambulatory Visit: Payer: Self-pay

## 2013-09-19 ENCOUNTER — Encounter: Payer: Self-pay | Admitting: Internal Medicine

## 2013-09-19 ENCOUNTER — Ambulatory Visit (INDEPENDENT_AMBULATORY_CARE_PROVIDER_SITE_OTHER): Payer: Medicare Other | Admitting: Internal Medicine

## 2013-09-19 ENCOUNTER — Ambulatory Visit: Payer: Medicare Other | Admitting: Internal Medicine

## 2013-09-19 VITALS — BP 193/75 | HR 56 | Ht 63.0 in | Wt 154.0 lb

## 2013-09-19 DIAGNOSIS — I10 Essential (primary) hypertension: Secondary | ICD-10-CM | POA: Insufficient documentation

## 2013-09-19 DIAGNOSIS — I4891 Unspecified atrial fibrillation: Secondary | ICD-10-CM | POA: Diagnosis not present

## 2013-09-19 MED ORDER — CARVEDILOL 12.5 MG PO TABS: 12.5000 mg | ORAL_TABLET | Freq: Two times a day (BID) | ORAL | Status: DC

## 2013-09-19 NOTE — Assessment & Plan Note (Signed)
Her blood pressure is elevated. Will increase her coreg to 6. 25 mg twice daily and then increase it again as needed to 12.5 mg twice daily. She will come back in 2 weeks for a nurse visit.

## 2013-09-19 NOTE — Assessment & Plan Note (Signed)
She is maintaining NSR very nicely. Will follow.

## 2013-09-19 NOTE — Progress Notes (Signed)
HPI Katherine Miranda returns today for followup. She is a pleasant elderly woman who looks younger than her stated age. She has a h/o paroxysmal atrial fibrillation on Eliquis. In the interim she denies chest pain, sob, syncope or peripheral edema. No headache. Her blood pressure has been elevated. No syncope or palpitations. No Known Allergies   Current Outpatient Prescriptions  Medication Sig Dispense Refill  . alendronate (FOSAMAX) 70 MG tablet Take 70 mg by mouth every 7 (seven) days. Take with a full glass of water on an empty stomach.  On wednesdays      . amLODipine (NORVASC) 5 MG tablet Take 2 tablets (10 mg total) by mouth at bedtime.      Marland Kitchen apixaban (ELIQUIS) 2.5 MG TABS tablet Take 1 tablet (2.5 mg total) by mouth 2 (two) times daily.  180 tablet  1  . Ascorbic Acid (VITAMIN C) 1000 MG tablet Take 1,000 mg by mouth daily.      Marland Kitchen CALCIUM PO Take 1 tablet by mouth 2 (two) times daily.       . carvedilol (COREG) 3.125 MG tablet Take 1 tablet (3.125 mg total) by mouth 2 (two) times daily.  180 tablet  1  . cetirizine (ZYRTEC) 10 MG tablet Take 10 mg by mouth at bedtime.      . Cholecalciferol (VITAMIN D-3 PO) Take 1 tablet by mouth 2 (two) times daily.      Marland Kitchen COENZYME Q-10 PO Take 1 tablet by mouth every morning.      Marland Kitchen CRANBERRY PO Take 1 capsule by mouth daily.       Marland Kitchen donepezil (ARICEPT) 10 MG tablet Take 10 mg by mouth at bedtime.      . iron polysaccharides (NIFEREX) 150 MG capsule Take 150 mg by mouth daily.      . mirabegron ER (MYRBETRIQ) 25 MG TB24 tablet Take 25 mg by mouth daily.      . Omega-3 Fatty Acids (FISH OIL PO) Take 1 capsule by mouth daily.       . timolol (BETIMOL) 0.5 % ophthalmic solution Place 1 drop into the right eye daily.       . travoprost, benzalkonium, (TRAVATAN) 0.004 % ophthalmic solution Place 1 drop into both eyes at bedtime.       No current facility-administered medications for this visit.     Past Medical History  Diagnosis Date  .  Hypertension   . Dementia   . Arthritis     ROS:   All systems reviewed and negative except as noted in the HPI.   Past Surgical History  Procedure Laterality Date  . Knee arthroscopy    . Back surgery       No family history on file.   History   Social History  . Marital Status: Widowed    Spouse Name: N/A    Number of Children: N/A  . Years of Education: N/A   Occupational History  . Not on file.   Social History Main Topics  . Smoking status: Never Smoker   . Smokeless tobacco: Never Used  . Alcohol Use: No  . Drug Use: No  . Sexual Activity: No   Other Topics Concern  . Not on file   Social History Narrative  . No narrative on file     BP 193/75  Pulse 56  Ht 5\' 3"  (1.6 m)  Wt 154 lb (69.854 kg)  BMI 27.29 kg/m2  Physical Exam:  Well appearing  elderly woman, NAD HEENT: Unremarkable Neck:  No JVD, no thyromegally Back:  No CVA tenderness Lungs:  Clear with no wheezes, rales, or rhonchi HEART:  IRegular rate rhythm, no murmurs, no rubs, no clicks Abd:  soft, positive bowel sounds, no organomegally, no rebound, no guarding Ext:  2 plus pulses, no edema, no cyanosis, no clubbing Skin:  No rashes no nodules Neuro:  CN II through XII intact, motor grossly intact  Assess/Plan:

## 2013-09-19 NOTE — Patient Instructions (Signed)
Your physician recommends that you schedule a follow-up appointment in: 2 weeks for BP check and EKG   Your physician wants you to follow-up in: 6 months with Dr Knox Saliva will receive a reminder letter in the mail two months in advance. If you don't receive a letter, please call our office to schedule the follow-up appointment.  Your physician has recommended you make the following change in your medication:  1) Increase Carvedilol to 6.25mg  twice daily--if BP and HR are good in 2 weeks at visit will keep the same but may increase to 12.5mg  twice daily

## 2013-10-03 ENCOUNTER — Ambulatory Visit (INDEPENDENT_AMBULATORY_CARE_PROVIDER_SITE_OTHER): Payer: Medicare Other | Admitting: *Deleted

## 2013-10-03 ENCOUNTER — Encounter: Payer: Self-pay | Admitting: Internal Medicine

## 2013-10-03 VITALS — BP 160/80 | HR 59 | Ht 64.0 in | Wt 155.5 lb

## 2013-10-03 DIAGNOSIS — I10 Essential (primary) hypertension: Secondary | ICD-10-CM | POA: Diagnosis not present

## 2013-10-03 DIAGNOSIS — I4891 Unspecified atrial fibrillation: Secondary | ICD-10-CM | POA: Diagnosis not present

## 2013-10-03 NOTE — Progress Notes (Signed)
1.) Reason for visit: 12 lead EKG and BP check: Sinus bradycardia 59 beats/minute, BP left arm 160/80, right arm 158/74.   2.) Name of MD requesting visit: Cristopher Peru MD  3.) H&P:  A-fib, and Hypertension.   4.) ROS related to problem: Carvedilol medication was increased on her last office visit to 6.25 mg twice a day   5.) Assessment and plan per MD: continue medication dose 6.25 mg twice a day, as scheduled.  6.) Provider sign-of(MD statement):

## 2013-11-09 ENCOUNTER — Ambulatory Visit: Payer: Medicare Other | Admitting: Podiatry

## 2013-11-17 DIAGNOSIS — Z23 Encounter for immunization: Secondary | ICD-10-CM | POA: Diagnosis not present

## 2013-11-17 DIAGNOSIS — T148XXA Other injury of unspecified body region, initial encounter: Secondary | ICD-10-CM | POA: Diagnosis not present

## 2013-11-21 ENCOUNTER — Ambulatory Visit (INDEPENDENT_AMBULATORY_CARE_PROVIDER_SITE_OTHER): Payer: Medicare Other

## 2013-11-21 DIAGNOSIS — M79609 Pain in unspecified limb: Secondary | ICD-10-CM

## 2013-11-21 DIAGNOSIS — B351 Tinea unguium: Secondary | ICD-10-CM | POA: Diagnosis not present

## 2013-11-21 DIAGNOSIS — M79673 Pain in unspecified foot: Secondary | ICD-10-CM

## 2013-11-21 NOTE — Patient Instructions (Signed)

## 2013-11-21 NOTE — Progress Notes (Signed)
   Subjective:    Patient ID: Katherine Miranda, female    DOB: 1921-05-30, 80 y.o.   MRN: [id number]  HPI Comments: Pt presents for debridement of 10 toenails.     Review of Systems no new findings or systemic changes noted     Objective:   Physical Exam Lower extremity objective findings as follows vascular status appears to be intact DP +2/4 bilateral PT plus one over 4 bilateral patient has apparently a laceration with some localized cellulitis the anterior left shin. This is at a Tegaderm dressing on it is being managed at this time. Neurologically epicritic and proprioceptive sensations appear to be intact there is normal plantar response DTRs not elicited dermatologically skin color pigment normal hair growth absent nails thick brittle crumbly friable hallux second third and fourth digits bilateral tender both on palpation and with enclosed shoe wear. Orthopedic biomechanical exam unremarkable noncontributory no open wounds or ulcerations other than the lesion being managed on her left shin.       Assessment & Plan:  Assessment this time findings consistent with onychomycosis nails painful criptotic incurvated friable 1 through 4 bilateral debridement at this time the presence of pain in symptomology and onychomycosis did recommend topical antifungal therapy such as Fungi-Nail to be obtained over-the-counter return for future palliative care and as-needed basis suggest 3 month followup  Harriet Masson DPM

## 2013-11-23 DIAGNOSIS — T148XXA Other injury of unspecified body region, initial encounter: Secondary | ICD-10-CM | POA: Diagnosis not present

## 2013-12-26 ENCOUNTER — Other Ambulatory Visit: Payer: Self-pay | Admitting: Internal Medicine

## 2013-12-27 ENCOUNTER — Other Ambulatory Visit: Payer: Self-pay

## 2013-12-27 MED ORDER — APIXABAN 2.5 MG PO TABS: 2.5000 mg | ORAL_TABLET | Freq: Two times a day (BID) | ORAL | Status: DC

## 2014-01-04 ENCOUNTER — Other Ambulatory Visit: Payer: Self-pay

## 2014-01-04 MED ORDER — APIXABAN 2.5 MG PO TABS: 2.5000 mg | ORAL_TABLET | Freq: Two times a day (BID) | ORAL | Status: DC

## 2014-01-12 DIAGNOSIS — H4011X0 Primary open-angle glaucoma, stage unspecified: Secondary | ICD-10-CM | POA: Diagnosis not present

## 2014-01-12 DIAGNOSIS — H25019 Cortical age-related cataract, unspecified eye: Secondary | ICD-10-CM | POA: Diagnosis not present

## 2014-01-12 DIAGNOSIS — H2510 Age-related nuclear cataract, unspecified eye: Secondary | ICD-10-CM | POA: Diagnosis not present

## 2014-01-12 DIAGNOSIS — H409 Unspecified glaucoma: Secondary | ICD-10-CM | POA: Diagnosis not present

## 2014-02-23 DIAGNOSIS — L989 Disorder of the skin and subcutaneous tissue, unspecified: Secondary | ICD-10-CM | POA: Diagnosis not present

## 2014-02-23 DIAGNOSIS — I4891 Unspecified atrial fibrillation: Secondary | ICD-10-CM | POA: Diagnosis not present

## 2014-02-23 DIAGNOSIS — E782 Mixed hyperlipidemia: Secondary | ICD-10-CM | POA: Diagnosis not present

## 2014-02-23 DIAGNOSIS — I10 Essential (primary) hypertension: Secondary | ICD-10-CM | POA: Diagnosis not present

## 2014-02-23 DIAGNOSIS — N318 Other neuromuscular dysfunction of bladder: Secondary | ICD-10-CM | POA: Diagnosis not present

## 2014-02-23 DIAGNOSIS — F0390 Unspecified dementia without behavioral disturbance: Secondary | ICD-10-CM | POA: Diagnosis not present

## 2014-02-23 DIAGNOSIS — Z23 Encounter for immunization: Secondary | ICD-10-CM | POA: Diagnosis not present

## 2014-02-27 ENCOUNTER — Ambulatory Visit: Payer: Medicare Other

## 2014-03-15 DIAGNOSIS — L821 Other seborrheic keratosis: Secondary | ICD-10-CM | POA: Diagnosis not present

## 2014-03-16 DIAGNOSIS — I10 Essential (primary) hypertension: Secondary | ICD-10-CM | POA: Diagnosis not present

## 2014-03-16 DIAGNOSIS — E782 Mixed hyperlipidemia: Secondary | ICD-10-CM | POA: Diagnosis not present

## 2014-03-16 DIAGNOSIS — I482 Chronic atrial fibrillation: Secondary | ICD-10-CM | POA: Diagnosis not present

## 2014-03-16 DIAGNOSIS — L820 Inflamed seborrheic keratosis: Secondary | ICD-10-CM | POA: Diagnosis not present

## 2014-03-16 DIAGNOSIS — M8580 Other specified disorders of bone density and structure, unspecified site: Secondary | ICD-10-CM | POA: Diagnosis not present

## 2014-03-16 DIAGNOSIS — M150 Primary generalized (osteo)arthritis: Secondary | ICD-10-CM | POA: Diagnosis not present

## 2014-03-16 DIAGNOSIS — F0390 Unspecified dementia without behavioral disturbance: Secondary | ICD-10-CM | POA: Diagnosis not present

## 2014-03-16 DIAGNOSIS — I679 Cerebrovascular disease, unspecified: Secondary | ICD-10-CM | POA: Diagnosis not present

## 2014-03-22 ENCOUNTER — Ambulatory Visit: Payer: Medicare Other | Admitting: Internal Medicine

## 2014-03-29 ENCOUNTER — Ambulatory Visit (INDEPENDENT_AMBULATORY_CARE_PROVIDER_SITE_OTHER): Payer: Medicare Other | Admitting: Internal Medicine

## 2014-03-29 ENCOUNTER — Encounter: Payer: Self-pay | Admitting: Internal Medicine

## 2014-03-29 VITALS — BP 162/80 | HR 56 | Ht 64.0 in | Wt 152.5 lb

## 2014-03-29 DIAGNOSIS — I10 Essential (primary) hypertension: Secondary | ICD-10-CM | POA: Diagnosis not present

## 2014-03-29 DIAGNOSIS — I480 Paroxysmal atrial fibrillation: Secondary | ICD-10-CM

## 2014-03-29 NOTE — Progress Notes (Signed)
HPI Katherine Miranda returns today for followup. She is a pleasant elderly woman who looks younger than her stated age. She has a h/o paroxysmal atrial fibrillation on Eliquis. In the interim she denies chest pain, sob, syncope or peripheral edema. No headache. Her blood pressure has been elevated. No syncope or palpitations. She denies fever or chills.  No Known Allergies   Current Outpatient Prescriptions  Medication Sig Dispense Refill  . alendronate (FOSAMAX) 70 MG tablet Take 70 mg by mouth every 7 (seven) days. Take with a full glass of water on an empty stomach.  On wednesdays      . amLODipine (NORVASC) 5 MG tablet Take 2 tablets (10 mg total) by mouth at bedtime.      Marland Kitchen apixaban (ELIQUIS) 2.5 MG TABS tablet Take 1 tablet (2.5 mg total) by mouth 2 (two) times daily.  180 tablet  1  . Ascorbic Acid (VITAMIN C) 1000 MG tablet Take 1,000 mg by mouth daily.      Marland Kitchen CALCIUM PO Take 1 tablet by mouth 2 (two) times daily.       . carvedilol (COREG) 12.5 MG tablet Take 1 tablet (12.5 mg total) by mouth 2 (two) times daily.  180 tablet  3  . cetirizine (ZYRTEC) 10 MG tablet Take 10 mg by mouth at bedtime.      . Cholecalciferol (VITAMIN D-3 PO) Take 1 tablet by mouth 2 (two) times daily.      Marland Kitchen COENZYME Q-10 PO Take 1 tablet by mouth every morning.      Marland Kitchen CRANBERRY PO Take 1 capsule by mouth daily.       Marland Kitchen donepezil (ARICEPT) 10 MG tablet Take 10 mg by mouth at bedtime.      . iron polysaccharides (NIFEREX) 150 MG capsule Take 150 mg by mouth daily.      . Memantine HCl ER (NAMENDA XR) 28 MG CP24 Take 28 mg by mouth.      . mirabegron ER (MYRBETRIQ) 25 MG TB24 tablet Take 25 mg by mouth daily.      . Omega-3 Fatty Acids (FISH OIL PO) Take 1 capsule by mouth daily.       . timolol (BETIMOL) 0.5 % ophthalmic solution Place 1 drop into the right eye daily.       . travoprost, benzalkonium, (TRAVATAN) 0.004 % ophthalmic solution Place 1 drop into both eyes at bedtime.       No current  facility-administered medications for this visit.     Past Medical History  Diagnosis Date  . Hypertension   . Dementia   . Arthritis     ROS:   All systems reviewed and negative except as noted in the HPI.   Past Surgical History  Procedure Laterality Date  . Knee arthroscopy    . Back surgery       No family history on file.   History   Social History  . Marital Status: Widowed    Spouse Name: N/A    Number of Children: N/A  . Years of Education: N/A   Occupational History  . Not on file.   Social History Main Topics  . Smoking status: Never Smoker   . Smokeless tobacco: Never Used  . Alcohol Use: No  . Drug Use: No  . Sexual Activity: No   Other Topics Concern  . Not on file   Social History Narrative  . No narrative on file     BP 162/80  Pulse 56  Ht 5\' 4"  (1.626 m)  Wt 152 lb 8 oz (69.174 kg)  BMI 26.16 kg/m2  Physical Exam:  Well appearing elderly woman, NAD HEENT: Unremarkable Neck:  7 cm JVD, no thyromegally Back:  No CVA tenderness Lungs:  Clear with no wheezes, rales, or rhonchi HEART:  IRegular rate rhythm, no murmurs, no rubs, no clicks Abd:  soft, positive bowel sounds, no organomegally, no rebound, no guarding Ext:  2 plus pulses, no edema, no cyanosis, no clubbing Skin:  No rashes no nodules Neuro:  CN II through XII intact, motor grossly intact  Assess/Plan:

## 2014-03-29 NOTE — Assessment & Plan Note (Signed)
She is maintaining sinus rhythm very nicely. No change in medical therapy at this time.

## 2014-03-29 NOTE — Assessment & Plan Note (Signed)
Her blood pressure is elevated today. I considered increasing her medications, but at this point would recommend only trying to reduce her sodium intake. No other changes. I'll see her back in one year.

## 2014-03-29 NOTE — Patient Instructions (Signed)
Your physician wants you to follow-up in: 12 months with Dr. Taylor. You will receive a reminder letter in the mail two months in advance. If you don't receive a letter, please call our office to schedule the follow-up appointment.    

## 2014-06-07 DIAGNOSIS — R350 Frequency of micturition: Secondary | ICD-10-CM | POA: Diagnosis not present

## 2014-06-07 DIAGNOSIS — R5383 Other fatigue: Secondary | ICD-10-CM | POA: Diagnosis not present

## 2014-06-07 DIAGNOSIS — I10 Essential (primary) hypertension: Secondary | ICD-10-CM | POA: Diagnosis not present

## 2014-06-26 DIAGNOSIS — N390 Urinary tract infection, site not specified: Secondary | ICD-10-CM | POA: Diagnosis not present

## 2014-07-06 ENCOUNTER — Other Ambulatory Visit: Payer: Self-pay

## 2014-07-06 MED ORDER — APIXABAN 2.5 MG PO TABS: 2.5000 mg | ORAL_TABLET | Freq: Two times a day (BID) | ORAL | Status: DC

## 2014-08-15 ENCOUNTER — Telehealth: Payer: Self-pay | Admitting: Internal Medicine

## 2014-08-15 NOTE — Telephone Encounter (Signed)
New message    Pt c/o medication issue:  1. Name of Medication: eliquis  2.5 mg   2. How are you currently taking this medication (dosage and times per day)?  In am & pm   3. Are you having a reaction (difficulty breathing--STAT)? Bruises on legs, wrist .   skin is not broken,.   4. What is your medication issue? daugher concerns.

## 2014-08-15 NOTE — Telephone Encounter (Signed)
Spoke with patient's daughter and let her know that bruising can be expected  If she feels its getting worse she can always call and we can address

## 2014-08-31 DIAGNOSIS — I482 Chronic atrial fibrillation: Secondary | ICD-10-CM | POA: Diagnosis not present

## 2014-08-31 DIAGNOSIS — H2513 Age-related nuclear cataract, bilateral: Secondary | ICD-10-CM | POA: Diagnosis not present

## 2014-08-31 DIAGNOSIS — E782 Mixed hyperlipidemia: Secondary | ICD-10-CM | POA: Diagnosis not present

## 2014-08-31 DIAGNOSIS — H4011X2 Primary open-angle glaucoma, moderate stage: Secondary | ICD-10-CM | POA: Diagnosis not present

## 2014-08-31 DIAGNOSIS — R809 Proteinuria, unspecified: Secondary | ICD-10-CM | POA: Diagnosis not present

## 2014-08-31 DIAGNOSIS — I10 Essential (primary) hypertension: Secondary | ICD-10-CM | POA: Diagnosis not present

## 2014-08-31 DIAGNOSIS — F0390 Unspecified dementia without behavioral disturbance: Secondary | ICD-10-CM | POA: Diagnosis not present

## 2014-09-14 ENCOUNTER — Emergency Department (HOSPITAL_COMMUNITY)
Admission: EM | Admit: 2014-09-14 | Discharge: 2014-09-14 | Disposition: A | Discharge status: Home / Self Care | Payer: Medicare Other | Attending: Emergency Medicine | Admitting: Emergency Medicine

## 2014-09-14 ENCOUNTER — Encounter (HOSPITAL_COMMUNITY): Payer: Self-pay | Admitting: Emergency Medicine

## 2014-09-14 DIAGNOSIS — S80822A Blister (nonthermal), left lower leg, initial encounter: Secondary | ICD-10-CM | POA: Diagnosis not present

## 2014-09-14 DIAGNOSIS — Z79899 Other long term (current) drug therapy: Secondary | ICD-10-CM | POA: Diagnosis not present

## 2014-09-14 DIAGNOSIS — T148XXA Other injury of unspecified body region, initial encounter: Secondary | ICD-10-CM

## 2014-09-14 DIAGNOSIS — X58XXXA Exposure to other specified factors, initial encounter: Secondary | ICD-10-CM | POA: Insufficient documentation

## 2014-09-14 DIAGNOSIS — S80829A Blister (nonthermal), unspecified lower leg, initial encounter: Secondary | ICD-10-CM

## 2014-09-14 DIAGNOSIS — Y9389 Activity, other specified: Secondary | ICD-10-CM | POA: Insufficient documentation

## 2014-09-14 DIAGNOSIS — I10 Essential (primary) hypertension: Secondary | ICD-10-CM | POA: Diagnosis not present

## 2014-09-14 DIAGNOSIS — Z8739 Personal history of other diseases of the musculoskeletal system and connective tissue: Secondary | ICD-10-CM | POA: Diagnosis not present

## 2014-09-14 DIAGNOSIS — S8992XA Unspecified injury of left lower leg, initial encounter: Secondary | ICD-10-CM | POA: Diagnosis present

## 2014-09-14 DIAGNOSIS — Y998 Other external cause status: Secondary | ICD-10-CM | POA: Insufficient documentation

## 2014-09-14 DIAGNOSIS — S8012XA Contusion of left lower leg, initial encounter: Secondary | ICD-10-CM | POA: Insufficient documentation

## 2014-09-14 DIAGNOSIS — Y9289 Other specified places as the place of occurrence of the external cause: Secondary | ICD-10-CM | POA: Diagnosis not present

## 2014-09-14 DIAGNOSIS — F0390 Unspecified dementia without behavioral disturbance: Secondary | ICD-10-CM | POA: Insufficient documentation

## 2014-09-14 NOTE — Discharge Instructions (Signed)
Contusion A contusion is a deep bruise. Contusions are the result of an injury that caused bleeding under the skin. The contusion may turn blue, purple, or yellow. Minor injuries will give you a painless contusion, but more severe contusions may stay painful and swollen for a few weeks.  CAUSES  A contusion is usually caused by a blow, trauma, or direct force to an area of the body. SYMPTOMS   Swelling and redness of the injured area.  Bruising of the injured area.  Tenderness and soreness of the injured area.  Pain. DIAGNOSIS  The diagnosis can be made by taking a history and physical exam. An X-ray, CT scan, or MRI may be needed to determine if there were any associated injuries, such as fractures. TREATMENT  Specific treatment will depend on what area of the body was injured. In general, the best treatment for a contusion is resting, icing, elevating, and applying cold compresses to the injured area. Over-the-counter medicines may also be recommended for pain control. Ask your caregiver what the best treatment is for your contusion. HOME CARE INSTRUCTIONS   Put ice on the injured area.  Put ice in a plastic bag.  Place a towel between your skin and the bag.  Leave the ice on for 15-20 minutes, 3-4 times a day, or as directed by your health care provider.  Only take over-the-counter or prescription medicines for pain, discomfort, or fever as directed by your caregiver. Your caregiver may recommend avoiding anti-inflammatory medicines (aspirin, ibuprofen, and naproxen) for 48 hours because these medicines may increase bruising.  Rest the injured area.  If possible, elevate the injured area to reduce swelling. SEEK IMMEDIATE MEDICAL CARE IF:   You have increased bruising or swelling.  You have pain that is getting worse.  Your swelling or pain is not relieved with medicines. MAKE SURE YOU:   Understand these instructions.  Will watch your condition.  Will get help right  away if you are not doing well or get worse. Document Released: 02/25/2005 Document Revised: 05/23/2013 Document Reviewed: 03/23/2011 [facility] Patient Information 2015 [place], [place]. This information is not intended to replace advice given to you by your health care provider. Make sure you discuss any questions you have with your health care provider. Bleeding Varicose Veins Varicose veins are veins that have become enlarged and twisted. Valves in the veins help return blood from the leg to the heart. If these valves are damaged, blood flows backwards and backs up into the veins in the leg near the skin. This causes the veins to become larger because of increased pressure within. Sometimes these veins bleed. CAUSES  Factors that can lead to bleeding varicose veins include:  Thinning of the skin that covers the veins. This skin is stretched as the veins enlarge.  Weak and thinning walls of the varicose veins. These thin walls are part of the reason why blood is not flowing normally to the heart.  Having high pressure in the veins. This high pressure occurs because the blood is not flowing freely back up to the heart.  Injury. Even a small injury to a varicose vein can cause bleeding.  Open wounds. A sore may develop near a varicose vein and not heal. This makes bleeding more likely.  Taking medicine that thins the blood. These medicines may include aspirin, anti-inflammatory medicine, and other blood thinners. SYMPTOMS  If bleeding is on the outside surface of the skin, blood can be seen. Sometimes, the bleeding stays under the skin.  If this happens, the blue or purple area will spread beyond the vein. This discoloration may be visible. DIAGNOSIS  To decide if you have a bleeding varicose vein, your caregiver may:  Ask about your symptoms. This will include when you first saw bleeding.  Ask about how long you have had varicose veins and if they cause you problems.  Ask about your overall  health.  Ask about possible causes, like recent cuts or if the area near the varicose veins was bumped or injured.  Examine the skin or leg that concerns you. Your caregiver will probably feel the veins.  Order imaging tests. These create detailed pictures of the veins. TREATMENT  The first goal of treating bleeding varicose veins is to stop the bleeding. Then, the aim is to keep any bleeding from happening again. Treatment will depend on the cause of the bleeding and how bad it is. Ask your caregiver about what would be best for you. Options include:  Raising (elevating) your leg. Lie down with your leg propped up on a pillow or cushion. Your foot should be above your heart.  Applying pressure to the spot that is bleeding. The bleeding should stop in a short time.  Wearing elastic stocking that "compress" your legs (compression stockings). An elastic bandage may do the same thing.  Applying an antibiotic cream on sores that are not healing.  Surgically removing or closing off the bleeding varicose veins. HOME CARE INSTRUCTIONS   Apply any creams that your caregiver prescribed. Follow the directions carefully.  Wear compression stockings or any special wraps that were prescribed. Make sure you know:  If you should wear them every day.  How long you should wear them.  If veins were removed or closed, a bandage (dressing) will probably cover the area. Make sure you know:  How often the dressing should be changed.  Whether the area can get wet.  When you can leave the skin uncovered.  Check your skin every day. Look for new sores and signs of bleeding.  To prevent future bleeding:  Use extra care in situations where you could cut your legs. Shaving, for example, or working outside in the garden.  Try to keep your legs elevated as much as possible. Lie down when you can. SEEK MEDICAL CARE IF:   You have any questions about how to wear compression stockings or elastic  bandages.  Your veins continue to bleed.  Sores develop near your varicose veins.  You have a sore that does not heal or gets bigger.  Pain increases in your leg.  The area around a varicose vein becomes warm, red, or tender to the touch.  You notice a yellowish fluid that smells bad coming from a spot where there was bleeding.  You develop a fever of more than 100.5 F (38.1 C). SEEK IMMEDIATE MEDICAL CARE IF:   You develop a fever of more than 102 F (38.9 C). Document Released: 10/04/2008 Document Revised: 08/10/2011 Document Reviewed: 09/19/2013 [facility] Patient Information 2015 [place], [place]. This information is not intended to replace advice given to you by your health care provider. Make sure you discuss any questions you have with your health care provider.

## 2014-09-14 NOTE — ED Notes (Signed)
Pt hit her leg about a week ago. Over the past couple of days pt left leg has become increasingly swollen and warm to touch (per family). Pt has bruise to lower leg with redness. Pt is on blood thinners.

## 2014-09-14 NOTE — ED Provider Notes (Signed)
CSN: [id number]     Arrival date & time 09/14/14  2109 History   First MD Initiated Contact with Patient 09/14/14 2250     Chief Complaint  Patient presents with  . Leg Swelling     (Consider location/radiation/quality/duration/timing/severity/associated sxs/prior Treatment) Patient is a 81 y.o. female presenting with leg pain.  Leg Pain Location:  Leg Time since incident:  1 week Injury: yes   Mechanism of injury comment:  Direct blow 1 week ago Leg location:  L lower leg Pain details:    Quality:  Aching   Severity:  Mild   Onset quality:  Gradual   Duration:  1 week   Timing:  Constant   Progression:  Worsening Chronicity:  New Prior injury to area:  Yes Relieved by:  Nothing Worsened by:  Bearing weight, abduction, flexion and extension Associated symptoms: swelling (with blistering)   Associated symptoms: no back pain, no itching, no neck pain and no numbness     Past Medical History  Diagnosis Date  . Hypertension   . Dementia   . Arthritis    Past Surgical History  Procedure Laterality Date  . Knee arthroscopy    . Back surgery     History reviewed. No pertinent family history. History  Substance Use Topics  . Smoking status: Never Smoker   . Smokeless tobacco: Never Used  . Alcohol Use: No   OB History    No data available     Review of Systems  Musculoskeletal: Negative for back pain and neck pain.  Skin: Negative for itching.  All other systems reviewed and are negative.     Allergies  Review of patient's allergies indicates no known allergies.  Home Medications   Prior to Admission medications   Medication Sig Start Date End Date Taking? Authorizing Provider  alendronate (FOSAMAX) 70 MG tablet Take 70 mg by mouth every 7 (seven) days. Take with a full glass of water on an empty stomach.  On wednesdays    Historical Provider, MD  amLODipine (NORVASC) 5 MG tablet Take 2 tablets (10 mg total) by mouth at bedtime. 10/10/12   Bobby Rumpf  York, PA-C  apixaban (ELIQUIS) 2.5 MG TABS tablet Take 1 tablet (2.5 mg total) by mouth 2 (two) times daily. 07/06/14   Evans Lance, MD  Ascorbic Acid (VITAMIN C) 1000 MG tablet Take 1,000 mg by mouth daily.    Historical Provider, MD  CALCIUM PO Take 1 tablet by mouth 2 (two) times daily.     Historical Provider, MD  carvedilol (COREG) 12.5 MG tablet Take 1 tablet (12.5 mg total) by mouth 2 (two) times daily. 09/19/13   Evans Lance, MD  cetirizine (ZYRTEC) 10 MG tablet Take 10 mg by mouth at bedtime.    Historical Provider, MD  Cholecalciferol (VITAMIN D-3 PO) Take 1 tablet by mouth 2 (two) times daily.    Historical Provider, MD  COENZYME Q-10 PO Take 1 tablet by mouth every morning.    Historical Provider, MD  CRANBERRY PO Take 1 capsule by mouth daily.     Historical Provider, MD  donepezil (ARICEPT) 10 MG tablet Take 10 mg by mouth at bedtime.    Historical Provider, MD  iron polysaccharides (NIFEREX) 150 MG capsule Take 150 mg by mouth daily.    Historical Provider, MD  Memantine HCl ER (NAMENDA XR) 28 MG CP24 Take 28 mg by mouth.    Historical Provider, MD  mirabegron ER (MYRBETRIQ) 25 MG TB24 tablet Take  25 mg by mouth daily.    Historical Provider, MD  Omega-3 Fatty Acids (FISH OIL PO) Take 1 capsule by mouth daily.     Historical Provider, MD  timolol (BETIMOL) 0.5 % ophthalmic solution Place 1 drop into the right eye daily.     Historical Provider, MD  travoprost, benzalkonium, (TRAVATAN) 0.004 % ophthalmic solution Place 1 drop into both eyes at bedtime.    Historical Provider, MD   BP 140/64 mmHg  Pulse 88  Temp(Src) 97.8 F (36.6 C) (Oral)  Resp 20  SpO2 100% Physical Exam  Constitutional: She is oriented to person, place, and time. She appears well-developed and well-nourished.  HENT:  Head: Normocephalic and atraumatic.  Right Ear: External ear normal.  Left Ear: External ear normal.  Eyes: Conjunctivae and EOM are normal. Pupils are equal, round, and reactive to  light.  Neck: Normal range of motion. Neck supple.  Cardiovascular: Normal rate, regular rhythm, normal heart sounds and intact distal pulses.   Pulmonary/Chest: Effort normal and breath sounds normal.  Abdominal: Soft. Bowel sounds are normal. There is no tenderness.  Musculoskeletal: Normal range of motion.  Flaccid bulla on L lateral lower leg with coagulated blood.  No leg swelling otherwise or calf pain.  No surrounding erythema  Neurological: She is alert and oriented to person, place, and time.  Skin: Skin is warm and dry.  Vitals reviewed.   ED Course  Procedures (including critical care time) Labs Review Labs Reviewed - No data to display  Imaging Review No results found.   EKG Interpretation None      MDM   Final diagnoses:  Contusion  Blister of leg    81 y.o. female with pertinent PMH of eliquis use presents with hemorrhagic bulla of L lower extremity as above.  Daughter was concerned about possibility of infection or bleeding risk due to eliquis.  Appearance today reassuring, doubt infection, DVT, or other concerning pathology, rather favor developing contusion.  DC home in stable condition.    I have reviewed all laboratory and imaging studies if ordered as above  1. Contusion   2. Blister of leg         Debby Freiberg, MD 09/14/14 2320

## 2014-09-18 DIAGNOSIS — S8012XA Contusion of left lower leg, initial encounter: Secondary | ICD-10-CM | POA: Diagnosis not present

## 2014-09-18 DIAGNOSIS — H6123 Impacted cerumen, bilateral: Secondary | ICD-10-CM | POA: Diagnosis not present

## 2014-11-09 DIAGNOSIS — H9201 Otalgia, right ear: Secondary | ICD-10-CM | POA: Diagnosis not present

## 2014-11-09 DIAGNOSIS — Z1389 Encounter for screening for other disorder: Secondary | ICD-10-CM | POA: Diagnosis not present

## 2014-11-09 DIAGNOSIS — H6123 Impacted cerumen, bilateral: Secondary | ICD-10-CM | POA: Diagnosis not present

## 2014-12-27 ENCOUNTER — Other Ambulatory Visit: Payer: Self-pay | Admitting: Internal Medicine

## 2015-01-02 ENCOUNTER — Encounter: Payer: Self-pay | Admitting: Podiatry

## 2015-01-02 ENCOUNTER — Ambulatory Visit (INDEPENDENT_AMBULATORY_CARE_PROVIDER_SITE_OTHER): Payer: Medicare Other | Admitting: Podiatry

## 2015-01-02 DIAGNOSIS — B351 Tinea unguium: Secondary | ICD-10-CM

## 2015-01-02 DIAGNOSIS — M79676 Pain in unspecified toe(s): Secondary | ICD-10-CM

## 2015-01-02 NOTE — Progress Notes (Signed)
   Subjective:    Patient ID: Katherine Miranda, female    DOB: [birth year], 81 y.o.   MRN: [id number]  HPI  N-THICK L-B/L TOENAILS D-LONG TERM O-SLOWLY C-LONGER A-PRESSURE T-TRIM  As patient presents today complaining of uncomfortable toenails and requests toenail debridement. The last visit for a similar service was 11/21/2013  Review of Systems  Eyes: Positive for visual disturbance.  Genitourinary: Positive for frequency and vaginal pain.  Musculoskeletal: Positive for gait problem.  Hematological: Bruises/bleeds easily.       Objective:   Physical Exam  Patient is difficulty responding and her daughter is present in the treatment room  Vascular: No peripheral edema noted bilaterally DP and PT pulses 2-4 bilaterally Capillary reflex immediate bilaterally  Neurological: Sensation to 10 g monofilament wire intact 4/5 bilaterally Vibratory sensation patient have difficulty responding Ankle reflex reactive bilaterally  Dermatological: Varicose veins ankles bilaterally The toenails are elongated, brittle, discolored, incurvated and tender to direct palpation 6-10  Musculoskeletal: No deformities noted      Assessment & Plan:   Assessment: Symptomatic onychomycoses 6-10  Plan: Debridement of toenails mechanically and electrically without any bleeding Patient return as needed or debridement

## 2015-02-02 ENCOUNTER — Other Ambulatory Visit: Payer: Self-pay | Admitting: Internal Medicine

## 2015-02-05 ENCOUNTER — Telehealth: Payer: Self-pay | Admitting: Internal Medicine

## 2015-02-05 NOTE — Telephone Encounter (Signed)
Spoke with daughter and she states her mom is on Eliquis 2.5 mg twice daily.  She bruises everywhere and was just worried that she should have labs done or go over medications to see if Vit could cause some of this.  I offered her an appointment in the [place] [facility] with Pharm D.  She is going to think about this and call me back if she wants to schedule an appointment.  This bruising has been going on for a while and is not worse but just worrisome as she can roll over in bed and get a bruise

## 2015-02-05 NOTE — Telephone Encounter (Signed)
Pt c/o medication issue:  1. Name of Medication: Eliquis  2. How are you currently taking this medication (dosage and times per day)? 2.5 mg per day  3. Are you having a reaction (difficulty breathing--STAT)? No  4. What is your medication issue? Pt's daughter calling stating that pt has been bruising on her arms, thighs, and on her face. Please call back and advise.

## 2015-02-15 DIAGNOSIS — R350 Frequency of micturition: Secondary | ICD-10-CM | POA: Diagnosis not present

## 2015-03-08 DIAGNOSIS — N183 Chronic kidney disease, stage 3 (moderate): Secondary | ICD-10-CM | POA: Diagnosis not present

## 2015-03-08 DIAGNOSIS — Z23 Encounter for immunization: Secondary | ICD-10-CM | POA: Diagnosis not present

## 2015-03-08 DIAGNOSIS — E782 Mixed hyperlipidemia: Secondary | ICD-10-CM | POA: Diagnosis not present

## 2015-03-08 DIAGNOSIS — I482 Chronic atrial fibrillation: Secondary | ICD-10-CM | POA: Diagnosis not present

## 2015-03-08 DIAGNOSIS — H401132 Primary open-angle glaucoma, bilateral, moderate stage: Secondary | ICD-10-CM | POA: Diagnosis not present

## 2015-03-08 DIAGNOSIS — F0390 Unspecified dementia without behavioral disturbance: Secondary | ICD-10-CM | POA: Diagnosis not present

## 2015-03-08 DIAGNOSIS — I129 Hypertensive chronic kidney disease with stage 1 through stage 4 chronic kidney disease, or unspecified chronic kidney disease: Secondary | ICD-10-CM | POA: Diagnosis not present

## 2015-03-31 DIAGNOSIS — R32 Unspecified urinary incontinence: Secondary | ICD-10-CM | POA: Diagnosis not present

## 2015-03-31 DIAGNOSIS — R300 Dysuria: Secondary | ICD-10-CM | POA: Diagnosis not present

## 2015-04-12 DIAGNOSIS — I482 Chronic atrial fibrillation: Secondary | ICD-10-CM | POA: Diagnosis not present

## 2015-04-12 DIAGNOSIS — F0390 Unspecified dementia without behavioral disturbance: Secondary | ICD-10-CM | POA: Diagnosis not present

## 2015-04-12 DIAGNOSIS — N390 Urinary tract infection, site not specified: Secondary | ICD-10-CM | POA: Diagnosis not present

## 2015-04-15 ENCOUNTER — Emergency Department (HOSPITAL_COMMUNITY): Payer: Medicare Other

## 2015-04-15 ENCOUNTER — Encounter (HOSPITAL_COMMUNITY): Payer: Self-pay | Admitting: Emergency Medicine

## 2015-04-15 ENCOUNTER — Emergency Department (HOSPITAL_COMMUNITY)
Admission: EM | Admit: 2015-04-15 | Discharge: 2015-04-15 | Disposition: A | Discharge status: Home / Self Care | Payer: Medicare Other | Attending: Emergency Medicine | Admitting: Emergency Medicine

## 2015-04-15 DIAGNOSIS — M1990 Unspecified osteoarthritis, unspecified site: Secondary | ICD-10-CM | POA: Diagnosis not present

## 2015-04-15 DIAGNOSIS — F0390 Unspecified dementia without behavioral disturbance: Secondary | ICD-10-CM | POA: Diagnosis not present

## 2015-04-15 DIAGNOSIS — M6281 Muscle weakness (generalized): Secondary | ICD-10-CM | POA: Diagnosis not present

## 2015-04-15 DIAGNOSIS — R4781 Slurred speech: Secondary | ICD-10-CM | POA: Insufficient documentation

## 2015-04-15 DIAGNOSIS — R531 Weakness: Secondary | ICD-10-CM | POA: Diagnosis not present

## 2015-04-15 DIAGNOSIS — I10 Essential (primary) hypertension: Secondary | ICD-10-CM | POA: Insufficient documentation

## 2015-04-15 DIAGNOSIS — I639 Cerebral infarction, unspecified: Secondary | ICD-10-CM | POA: Diagnosis not present

## 2015-04-15 DIAGNOSIS — H748X2 Other specified disorders of left middle ear and mastoid: Secondary | ICD-10-CM | POA: Diagnosis not present

## 2015-04-15 DIAGNOSIS — G912 (Idiopathic) normal pressure hydrocephalus: Secondary | ICD-10-CM

## 2015-04-15 DIAGNOSIS — R05 Cough: Secondary | ICD-10-CM | POA: Diagnosis not present

## 2015-04-15 DIAGNOSIS — Z79899 Other long term (current) drug therapy: Secondary | ICD-10-CM | POA: Insufficient documentation

## 2015-04-15 DIAGNOSIS — R269 Unspecified abnormalities of gait and mobility: Secondary | ICD-10-CM

## 2015-04-15 DIAGNOSIS — R404 Transient alteration of awareness: Secondary | ICD-10-CM | POA: Diagnosis not present

## 2015-04-15 HISTORY — DX: Unspecified atrial fibrillation: I48.91

## 2015-04-15 LAB — COMPREHENSIVE METABOLIC PANEL
ALBUMIN: 3.9 g/dL (ref 3.5–5.0)
ALT: 80 U/L — AB (ref 14–54)
AST: 68 U/L — AB (ref 15–41)
Alkaline Phosphatase: 81 U/L (ref 38–126)
Anion gap: 11 (ref 5–15)
BUN: 29 mg/dL — AB (ref 6–20)
CHLORIDE: 95 mmol/L — AB (ref 101–111)
CO2: 29 mmol/L (ref 22–32)
CREATININE: 1.19 mg/dL — AB (ref 0.44–1.00)
Calcium: 10 mg/dL (ref 8.9–10.3)
GFR calc Af Amer: 45 mL/min — ABNORMAL LOW (ref >60)
GFR, EST NON AFRICAN AMERICAN: 38 mL/min — AB (ref >60)
GLUCOSE: 88 mg/dL (ref 65–99)
Potassium: 4.6 mmol/L (ref 3.5–5.1)
Sodium: 135 mmol/L (ref 135–145)
Total Bilirubin: 0.6 mg/dL (ref 0.3–1.2)
Total Protein: 7.3 g/dL (ref 6.5–8.1)

## 2015-04-15 LAB — URINALYSIS, ROUTINE W REFLEX MICROSCOPIC
Bilirubin Urine: NEGATIVE
Glucose, UA: NEGATIVE mg/dL
Hgb urine dipstick: NEGATIVE
Ketones, ur: NEGATIVE mg/dL
LEUKOCYTES UA: NEGATIVE
NITRITE: NEGATIVE
PROTEIN: NEGATIVE mg/dL
Specific Gravity, Urine: 1.011 (ref 1.005–1.030)
UROBILINOGEN UA: 0.2 mg/dL (ref 0.0–1.0)
pH: 7 (ref 5.0–8.0)

## 2015-04-15 LAB — PROTIME-INR
INR: 1.29 (ref 0.00–1.49)
PROTHROMBIN TIME: 16.2 s — AB (ref 11.6–15.2)

## 2015-04-15 LAB — CBC WITH DIFFERENTIAL/PLATELET
BASOS ABS: 0 10*3/uL (ref 0.0–0.1)
BASOS PCT: 0 %
EOS PCT: 1 %
Eosinophils Absolute: 0.1 10*3/uL (ref 0.0–0.7)
HEMATOCRIT: 42.8 % (ref 36.0–46.0)
Hemoglobin: 14.6 g/dL (ref 12.0–15.0)
LYMPHS PCT: 12 %
Lymphs Abs: 0.7 10*3/uL (ref 0.7–4.0)
MCH: 29.4 pg (ref 26.0–34.0)
MCHC: 34.1 g/dL (ref 30.0–36.0)
MCV: 86.3 fL (ref 78.0–100.0)
Monocytes Absolute: 0.5 10*3/uL (ref 0.1–1.0)
Monocytes Relative: 8 %
NEUTROS ABS: 5 10*3/uL (ref 1.7–7.7)
Neutrophils Relative %: 79 %
PLATELETS: 214 10*3/uL (ref 150–400)
RBC: 4.96 MIL/uL (ref 3.87–5.11)
RDW: 15.1 % (ref 11.5–15.5)
WBC: 6.3 10*3/uL (ref 4.0–10.5)

## 2015-04-15 LAB — I-STAT TROPONIN, ED: Troponin i, poc: 0.01 ng/mL (ref 0.00–0.08)

## 2015-04-15 LAB — APTT: aPTT: 41 seconds — ABNORMAL HIGH (ref 24–37)

## 2015-04-15 MED ORDER — SODIUM CHLORIDE 0.9 % IV SOLN
INTRAVENOUS | Status: DC
  Administered 2015-04-15: 15:00:00 via INTRAVENOUS

## 2015-04-15 NOTE — ED Provider Notes (Signed)
CSN: [id number]     Arrival date & time 04/15/15  1124 History   First MD Initiated Contact with Patient 04/15/15 1127     Chief Complaint  Patient presents with  . Weakness     (Consider location/radiation/quality/duration/timing/severity/associated sxs/prior Treatment) Patient is a 81 y.o. female presenting with weakness. The history is provided by medical records, a relative and a caregiver.  Weakness   Level V caveat:  dementia 81 year old female with history of hypertension, dementia, arthritis, PAF on Eliquis, presenting to the ED for weakness.  Daughter reports she was at the beach over the weekend and returned home on Thursday while her mother was in another person's care, and was told that her mother seemed "a bit off". Daughter states she felt that patient had some mildly slurred speech and did appear to have less energy than normal. She was not lethargic or unresponsive. She states she took her to her PCP on Friday who performed a NIH stroke scale in the office and checked a urinalysis and send the patient back home. Daughter states throughout the weekend she continued to appear somewhat weaker than normal and continues to have slurred speech which is new.  States speech is still comprehensible, but it doesn't sound normal. Today, patient was attempting to get out of the dining room chair to walk to the bathroom and had to be 2 person assisted to the sofa to keep her from falling. She did not fall or strike her head. Patient ambulatory with walker at baseline-- daughter states she is usually very mobile throughout the house and is mostly independent. No reported fever, chills, sweats. No chest pain or shortness of breath. Daughter does report she has had a dry cough intermittently over the past week. Daughter reports patient does have history of TIAs several years ago. She has never had a stroke. No history of seizure disorder.    Past Medical History  Diagnosis Date  . Hypertension    . Dementia   . Arthritis    Past Surgical History  Procedure Laterality Date  . Knee arthroscopy    . Back surgery     History reviewed. No pertinent family history. Social History  Substance Use Topics  . Smoking status: Never Smoker   . Smokeless tobacco: Never Used  . Alcohol Use: No   OB History    No data available     Review of Systems  Unable to perform ROS: Dementia      Allergies  Review of patient's allergies indicates no known allergies.  Home Medications   Prior to Admission medications   Medication Sig Start Date End Date Taking? Authorizing Provider  alendronate (FOSAMAX) 70 MG tablet Take 70 mg by mouth every 7 (seven) days. Take with a full glass of water on an empty stomach.  On wednesdays    Historical Provider, MD  amLODipine (NORVASC) 5 MG tablet Take 2 tablets (10 mg total) by mouth at bedtime. 10/10/12   Melton Alar, PA-C  Ascorbic Acid (VITAMIN C) 1000 MG tablet Take 1,000 mg by mouth daily.    Historical Provider, MD  CALCIUM PO Take 1 tablet by mouth 2 (two) times daily.     Historical Provider, MD  carvedilol (COREG) 12.5 MG tablet TAKE 1 TABLET TWICE A DAY 02/05/15   Evans Lance, MD  cetirizine (ZYRTEC) 10 MG tablet Take 10 mg by mouth at bedtime.    Historical Provider, MD  Cholecalciferol (VITAMIN D-3 PO) Take 1 tablet by mouth  2 (two) times daily.    Historical Provider, MD  COENZYME Q-10 PO Take 1 tablet by mouth every morning.    Historical Provider, MD  CRANBERRY PO Take 1 capsule by mouth daily.     Historical Provider, MD  donepezil (ARICEPT) 10 MG tablet Take 10 mg by mouth at bedtime.    Historical Provider, MD  ELIQUIS 2.5 MG TABS tablet TAKE 1 TABLET TWICE A DAY 12/28/14   Evans Lance, MD  iron polysaccharides (NIFEREX) 150 MG capsule Take 150 mg by mouth daily.    Historical Provider, MD  Memantine HCl ER (NAMENDA XR) 28 MG CP24 Take 28 mg by mouth.    Historical Provider, MD  mirabegron ER (MYRBETRIQ) 25 MG TB24 tablet  Take 25 mg by mouth daily.    Historical Provider, MD  Omega-3 Fatty Acids (FISH OIL PO) Take 1 capsule by mouth daily.     Historical Provider, MD  timolol (BETIMOL) 0.5 % ophthalmic solution Place 1 drop into the right eye daily.     Historical Provider, MD  travoprost, benzalkonium, (TRAVATAN) 0.004 % ophthalmic solution Place 1 drop into both eyes at bedtime.    Historical Provider, MD   BP 149/68 mmHg  Pulse 62  Temp(Src) 97 F (36.1 C) (Oral)  Resp 18  SpO2 95%   Physical Exam  Constitutional: She appears well-developed and well-nourished. No distress.  Appears much younger than stated age, no distress  HENT:  Head: Normocephalic and atraumatic.  Mouth/Throat: Oropharynx is clear and moist.  Eyes: Conjunctivae and EOM are normal. Pupils are equal, round, and reactive to light.  Neck: Normal range of motion. Neck supple.  Cardiovascular: Normal rate, regular rhythm and normal heart sounds.   Pulmonary/Chest: Effort normal and breath sounds normal.  Abdominal: Soft. Bowel sounds are normal. There is no tenderness. There is no guarding.  Musculoskeletal: Normal range of motion.  Neurological: She is alert.  awake, alert, oriented to self and place but confused about time which is baseline; answering questions and following commands when prompted; moving all extremities well with 5/5 strength in all extremities; normal sensation throughout; normal finger to nose bilaterally; no facial droop noted; appears to have mild lisp at the end of her words  Skin: Skin is warm and dry. She is not diaphoretic.  Psychiatric: She has a normal mood and affect.  Nursing note and vitals reviewed.   ED Course  Procedures (including critical care time) Labs Review Labs Reviewed  COMPREHENSIVE METABOLIC PANEL - Abnormal; Notable for the following:    Chloride 95 (*)    BUN 29 (*)    Creatinine, Ser 1.19 (*)    AST 68 (*)    ALT 80 (*)    GFR calc non Af Amer 38 (*)    GFR calc Af Amer 45 (*)     All other components within normal limits  PROTIME-INR - Abnormal; Notable for the following:    Prothrombin Time 16.2 (*)    All other components within normal limits  APTT - Abnormal; Notable for the following:    aPTT 41 (*)    All other components within normal limits  CBC WITH DIFFERENTIAL/PLATELET  URINALYSIS, ROUTINE W REFLEX MICROSCOPIC (NOT AT [facility])  Randolm Idol, ED    Imaging Review Dg Chest 2 View  04/15/2015  CLINICAL DATA:  Slurred speech for 4 days.  Cough. EXAM: CHEST  2 VIEW COMPARISON:  10/06/2012 FINDINGS: Heart size is upper normal. There is mild pulmonary vascular  congestion. There is small left pleural effusion. There is minimal patchy density within the left lung base raise the question of atelectasis or early infiltrate. IMPRESSION: 1. Pulmonary vascular congestion. 2. Small left effusion and left lower lobe atelectasis or infiltrate. Electronically Signed   By: Nolon Nations M.D.   On: 04/15/2015 13:05   Ct Head Wo Contrast  04/15/2015  CLINICAL DATA:  C/o generalized weakness on Thursday and was taken to PCP. Pt became increasingly weak today and was unable to walk. Pt denies headache or injury Hx: HTN, Dementia EXAM: CT HEAD WITHOUT CONTRAST TECHNIQUE: Contiguous axial images were obtained from the base of the skull through the vertex without intravenous contrast. COMPARISON:  10/06/2012 FINDINGS: There is central and cortical atrophy. Periventricular white matter changes are consistent with small vessel disease. There is no intra or extra-axial fluid collection or mass lesion. The basilar cisterns and ventricles have a normal appearance. There is no CT evidence for acute infarction or hemorrhage. Bone windows show no acute calvarial injury. Small left mastoid effusion noted. There is mild mucoperiosteal thickening of the paranasal sinuses. There is atherosclerotic calcification of the internal carotid arteries. IMPRESSION: 1. Atrophy and small vessel disease.  2.  No evidence for acute intracranial abnormality. 3. Chronic paranasal sinus disease. 4. Small left mastoid effusion. Electronically Signed   By: Nolon Nations M.D.   On: 04/15/2015 13:41   I have personally reviewed and evaluated these images and lab results as part of my medical decision-making.   EKG Interpretation None      MDM   Final diagnoses:  Generalized weakness  Slurred speech   81 year old female here with generalized weakness and questionable slurred speech of the past 4 days. Unable to walk with her walker this morning without 2 person assist. No fall, head injury, or loss of consciousness. Patient is awake, alert, and oriented to her baseline per family. Her speech is overall clear, she does have a small lisp at the end of some of her words which family reports is new.  Patient does have history of TIA without history of stroke. She is on Eliquis for AFIB.  Will obtain CT head, chest x-ray, labs, EKG, urinalysis.  Workup is essentially unremarkable. UA without any signs of infection. Slight elevation of serum creatinine and BUN consistent with mild dehydration. Patient was given IV fluids for this. Case was discussed with neurology who has evaluated patient in the ED, feels this is due to normal pressure hydrocephalus and does not recommend MRI or further stroke work-up at time. At this time as there are no other medical conditions requiring admission, i have asked case management to meet with patient/family regarding home PT/OT, face-to-face evaluation ordered.  Family in agreement with this.  Patient will continue using walker at home to assist with ambulation.  Continue oral hydration.  Follow-up with PCP encouraged.  Discussed plan with patient, he/she acknowledged understanding and agreed with plan of care.  Return precautions given for new or worsening symptoms.  Case discussed with attending physician, Dr. Johnney Killian, who agrees with assessment and plan of care.  Larene Pickett, PA-C 04/15/15 1617  Charlesetta Shanks, MD 05/01/15 [phone]

## 2015-04-15 NOTE — Care Management (Signed)
ED CM spoke with patient and family at bedside. Patient's daughter Leanna Sato expressed her displeasure concerning her mother not being admitted. CM listened to her concerns and apologized for her displeasures. CM explained that patient does not meet a criteria for an admission to the hospital, based on ED evaluation no acute finding were noted. [place] services were arranged for PT/OT with [facility] by daytime CM. Daughter reports patient was independent prior to this episode and is concerned that something else may be going on.  CM spoke with Dr. Johnney Killian EDP regarding her concerns, recommendation was made  to add RN, [facility]. CM discussed with patient and daughter, they are agreeable with this plan. Provided CMS information on Obs vs Inpatient daughter appreciative of the explanation. Patient will be discharged home with HH/patient unable to ambulate agreed on [facility] transport.   Verified contact information where services will be rendered. Referral was then faxed to [facility] [phone], fax confirmation received.  Updated Janett Billow RN and Dr. Dan Europe on Discharge plan. No further ED CM needs identified.

## 2015-04-15 NOTE — Care Management Note (Signed)
Case Management Note  Patient Details  Name: Katherine Miranda MRN: [id number] Date of Birth: 1921-04-25  Subjective/Objective:                  81 y.o. female presenting with weakness.//Home alone, supportive family nearby.  Action/Plan: Follow for disposition needs.   Expected Discharge Date:     04/15/15             Expected Discharge Plan:  [place]  In-House Referral:  NA  Discharge planning Services  CM Consult  Post Acute Care Choice:  Home Health Choice offered to:  Patient, Adult Children  DME Arranged:  N/A DME Agency:  NA  HH Arranged:  PT [place] Agency:  [facility]  Status of Service:  Completed, signed off  Medicare Important Message Given:    Date Medicare IM Given:    Medicare IM give by:    Date Additional Medicare IM Given:    Additional Medicare Important Message give by:     If discussed at [place] of Stay Meetings, dates discussed:    Additional Comments: Shaylea Ucci J. Clydene Laming, RN, BSN, [employer] [phone] Spoke with pt at bedside regarding discharge planning for [facility]. Offered pt list of home health agencies to choose from.  Pt chose [facility] to render services. Edwinna Areola, RN of [facility] notified.  No DME needs identified at this time.    Fuller Mandril, RN 04/15/2015, 3:42 PM

## 2015-04-15 NOTE — Consult Note (Signed)
Referring Physician: Pfeiffer    Chief Complaint: dysarthria  HPI:                                                                                                                                         Katherine Miranda is an 81 y.o. female with known dementia.  At baseline she needs help with her ADL's but able to feed herself.  She is able to ambulate with her walker. Last Thursday her daughter noted she was talking slower and had slurred speech. Due to these symptoms worsening today and slower ambulation she was brought to ED  Date last known well: Date: 04/11/2015 Time last known well: Unable to determine tPA Given: No: unknown last normal  Modified Rankin: Rankin Score=4    Past Medical History  Diagnosis Date  . Hypertension   . Dementia   . Arthritis     Past Surgical History  Procedure Laterality Date  . Knee arthroscopy    . Back surgery      History reviewed. No pertinent family history. Social History:  reports that she has never smoked. She has never used smokeless tobacco. She reports that she does not drink alcohol or use illicit drugs.  Allergies: No Known Allergies  Medications:                                                                                                                           Current Facility-Administered Medications  Medication Dose Route Frequency Provider Last Rate Last Dose  . 0.9 %  sodium chloride infusion   Intravenous Continuous Larene Pickett, PA-C 125 mL/hr at 04/15/15 1453     Current Outpatient Prescriptions  Medication Sig Dispense Refill  . alendronate (FOSAMAX) 70 MG tablet Take 70 mg by mouth every 7 (seven) days. Take with a full glass of water on an empty stomach.  On wednesdays    . amLODipine (NORVASC) 5 MG tablet Take 2 tablets (10 mg total) by mouth at bedtime.    . Ascorbic Acid (VITAMIN C) 1000 MG tablet Take 1,000 mg by mouth daily.    Marland Kitchen CALCIUM PO Take 1 tablet by mouth 2 (two) times daily.     .  carvedilol (COREG) 12.5 MG tablet TAKE 1 TABLET TWICE A DAY 180 tablet 0  . cetirizine (ZYRTEC) 10 MG tablet Take 10 mg by mouth  at bedtime.    . Cholecalciferol (VITAMIN D-3 PO) Take 1 tablet by mouth 2 (two) times daily.    Marland Kitchen COENZYME Q-10 PO Take 1 tablet by mouth every morning.    Marland Kitchen CRANBERRY PO Take 1 capsule by mouth daily.     Marland Kitchen donepezil (ARICEPT) 10 MG tablet Take 10 mg by mouth at bedtime.    Marland Kitchen ELIQUIS 2.5 MG TABS tablet TAKE 1 TABLET TWICE A DAY 180 tablet 3  . iron polysaccharides (NIFEREX) 150 MG capsule Take 150 mg by mouth daily.    . Memantine HCl ER (NAMENDA XR) 28 MG CP24 Take 28 mg by mouth.    . mirabegron ER (MYRBETRIQ) 25 MG TB24 tablet Take 25 mg by mouth daily.    . Omega-3 Fatty Acids (FISH OIL PO) Take 1 capsule by mouth daily.     . timolol (BETIMOL) 0.5 % ophthalmic solution Place 1 drop into the right eye daily.     . travoprost, benzalkonium, (TRAVATAN) 0.004 % ophthalmic solution Place 1 drop into both eyes at bedtime.    ,ed  ROS:                                                                                                                                       History obtained from the patient and and daughter  General ROS: negative for - chills, fatigue, fever, night sweats, weight gain or weight loss Psychological ROS: negative for - behavioral disorder, hallucinations, memory difficulties, mood swings or suicidal ideation Ophthalmic ROS: negative for - blurry vision, double vision, eye pain or loss of vision ENT ROS: negative for - epistaxis, nasal discharge, oral lesions, sore throat, tinnitus or vertigo Allergy and Immunology ROS: negative for - hives or itchy/watery eyes Hematological and Lymphatic ROS: negative for - bleeding problems, bruising or swollen lymph nodes Endocrine ROS: negative for - galactorrhea, hair pattern changes, polydipsia/polyuria or temperature intolerance Respiratory ROS: negative for - cough, hemoptysis, shortness of breath or  wheezing Cardiovascular ROS: negative for - chest pain, dyspnea on exertion, edema or irregular heartbeat Gastrointestinal ROS: negative for - abdominal pain, diarrhea, hematemesis, nausea/vomiting or stool incontinence Genito-Urinary ROS: negative for - dysuria, hematuria, incontinence or urinary frequency/urgency Musculoskeletal ROS: negative for - joint swelling or muscular weakness Neurological ROS: as noted in HPI Dermatological ROS: negative for rash and skin lesion changes  Neurologic Examination:  Blood pressure 143/59, pulse 56, temperature 97 F (36.1 C), temperature source Oral, resp. rate 18, SpO2 95 %.  HEENT-  Normocephalic, no lesions, without obvious abnormality.  Normal external eye and conjunctiva.    Neurological Examination Mental Status: Alert, not oriented to time or year and does not recall her daughters middle name. Able to spell "milk" forward but noin reverse. Marland Kitchen  Speech mildly dysarthric without evidence of aphasia.  Able to follow 3 step commands without difficulty. Cranial Nerves: II: Visual fields grossly normal, pupils equal, round, reactive to light and accommodation III,IV, VI: ptosis not present, extra-ocular motions intact bilaterally V,VII: smile symmetric, facial light touch sensation normal bilaterally VIII: hearing normal bilaterally IX,X: uvula rises symmetrically XI: bilateral shoulder shrug XII: midline tongue extension Motor: Right : Upper extremity   5/5    Left:     Upper extremity   5/5  Lower extremity   5/5     Lower extremity   5/5 Tone and bulk:normal tone throughout; no atrophy noted Sensory: Pinprick and light touch intact throughout, bilaterally Deep Tendon Reflexes: 1+ and symmetric throughout UE , 1+ KJ, no AJ Plantars: Right: downgoing   Left: downgoing Cerebellar: normal finger-to-nose,   Gait: has magnetic gait with short  shuffled steps.        Lab Results: Basic Metabolic Panel:  Recent Labs Lab 04/15/15 1254  NA 135  K 4.6  CL 95*  CO2 29  GLUCOSE 88  BUN 29*  CREATININE 1.19*  CALCIUM 10.0    Liver Function Tests:  Recent Labs Lab 04/15/15 1254  AST 68*  ALT 80*  ALKPHOS 81  BILITOT 0.6  PROT 7.3  ALBUMIN 3.9   No results for input(s): LIPASE, AMYLASE in the last 168 hours. No results for input(s): AMMONIA in the last 168 hours.  CBC:  Recent Labs Lab 04/15/15 1254  WBC 6.3  NEUTROABS 5.0  HGB 14.6  HCT 42.8  MCV 86.3  PLT 214    Cardiac Enzymes: No results for input(s): CKTOTAL, CKMB, CKMBINDEX, TROPONINI in the last 168 hours.  Lipid Panel: No results for input(s): CHOL, TRIG, HDL, CHOLHDL, VLDL, LDLCALC in the last 168 hours.  CBG: No results for input(s): GLUCAP in the last 168 hours.  Microbiology: Results for orders placed or performed during the hospital encounter of 10/06/12  Urine culture     Status: None   Collection Time: 10/07/12  5:58 AM  Result Value Ref Range Status   Specimen Description URINE, CLEAN CATCH  Final   Special Requests NONE  Final   Culture  Setup Time 10/07/2012 06:20  Final   Colony Count >=100,000 COLONIES/ML  Final   Culture ESCHERICHIA COLI  Final   Report Status 10/09/2012 FINAL  Final   Organism ID, Bacteria ESCHERICHIA COLI  Final      Susceptibility   Escherichia coli - MIC*    AMPICILLIN 4 SENSITIVE Sensitive     CEFAZOLIN <=4 SENSITIVE Sensitive     CEFTRIAXONE <=1 SENSITIVE Sensitive     CIPROFLOXACIN <=0.25 SENSITIVE Sensitive     GENTAMICIN <=1 SENSITIVE Sensitive     LEVOFLOXACIN <=0.12 SENSITIVE Sensitive     NITROFURANTOIN 32 SENSITIVE Sensitive     TOBRAMYCIN <=1 SENSITIVE Sensitive     TRIMETH/SULFA <=20 SENSITIVE Sensitive     PIP/TAZO <=4 SENSITIVE Sensitive     * ESCHERICHIA COLI    Coagulation Studies:  Recent Labs  04/15/15 1254  LABPROT 16.2*  INR 1.29    Imaging:  Dg Chest 2  View  04/15/2015  CLINICAL DATA:  Slurred speech for 4 days.  Cough. EXAM: CHEST  2 VIEW COMPARISON:  10/06/2012 FINDINGS: Heart size is upper normal. There is mild pulmonary vascular congestion. There is small left pleural effusion. There is minimal patchy density within the left lung base raise the question of atelectasis or early infiltrate. IMPRESSION: 1. Pulmonary vascular congestion. 2. Small left effusion and left lower lobe atelectasis or infiltrate. Electronically Signed   By: Nolon Nations M.D.   On: 04/15/2015 13:05   Ct Head Wo Contrast  04/15/2015  CLINICAL DATA:  C/o generalized weakness on Thursday and was taken to PCP. Pt became increasingly weak today and was unable to walk. Pt denies headache or injury Hx: HTN, Dementia EXAM: CT HEAD WITHOUT CONTRAST TECHNIQUE: Contiguous axial images were obtained from the base of the skull through the vertex without intravenous contrast. COMPARISON:  10/06/2012 FINDINGS: There is central and cortical atrophy. Periventricular white matter changes are consistent with small vessel disease. There is no intra or extra-axial fluid collection or mass lesion. The basilar cisterns and ventricles have a normal appearance. There is no CT evidence for acute infarction or hemorrhage. Bone windows show no acute calvarial injury. Small left mastoid effusion noted. There is mild mucoperiosteal thickening of the paranasal sinuses. There is atherosclerotic calcification of the internal carotid arteries. IMPRESSION: 1. Atrophy and small vessel disease. 2.  No evidence for acute intracranial abnormality. 3. Chronic paranasal sinus disease. 4. Small left mastoid effusion. Electronically Signed   By: Nolon Nations M.D.   On: 04/15/2015 13:41   Stroke Risk Factors - hypertension, Afib   Patient evaluated along with physician assistant, Etta Quill.    Assessment: 81 y.o. female with history of dementia, urinary incontinence and gait difficulty.  Exam is notable for  magnetic gait, and mild extrapyramidal symptoms. This coupled with CT imaging findings is consistent with a clinical diagnosis of possible normal pressure hydrocephalus, with a coexisting cortical atrophy associated with aging. No further neurodiagnostic testing recommended.   she will benefit with continued physical therapy either at a skilled nursing facility or with home health for gait training and falls prevention.   Neurology will sign off.  please call if any further questions .

## 2015-04-15 NOTE — ED Notes (Signed)
Patient transported to X-ray 

## 2015-04-15 NOTE — Care Management (Signed)
Aneri, Debruhl [id number] (CSN: [id number]) (81 y.o. F) (Adm: 04/15/15)    [facility]-ED-B19C-B19C      PCP    [name]    Demographics  Comment      Last edited by  on at    Address: Home Phone: Work PhoneGeophysical data processor:   [address]  [place] [address]   [phone] -- [phone]   SSN: Insurance: Marital Status: Religion:   [id number] MEDICARE Widowed [place]    Patient Ethnicity & Race    Ethnic Group Patient Race   Not Hispanic or Latino White or Caucasian    Documents Filed to Patient    Power of Attorney Living Will Code Status MyChart Status   Not on File Not on File Prior Pending    Admission Information    Attending Provider Admitting Provider Admission Type Admission Date/Time   Charlesetta Shanks, MD  Emergency 04/15/15 1124   Discharge Date Hospital Service Auth/Cert Status Service Area    Emergency Medicine Incomplete [place]   Unit Room/Bed Admission Status   [facility] DEPT B19C/B19C Admission (Confirmed)         Hospital Account    Name Acct ID Class Status Primary Coverage   Mieko, Hamre [id number] Emergency Open MEDICARE - MEDICARE PART A AND B        Guarantor Account (for Hospital Account [id number])    Name Relation to Pt Service Area Active? Acct Type   Marissa Nestle Self [facility]SA Yes Personal/Family   Address Phone       [address], [place] [address] [phone])          Coverage Information (for Hospital Account [id number])    1. [place] PART A AND B    F/O Payor/Plan Precert #   MEDICARE/MEDICARE PART A AND B    Subscriber Subscriber #   Emerey, Yueh [id number]   Address Phone   [address] [place], [place] [address]        2. GENERIC COMMERCIAL/GENERIC COMMERCIAL    F/O Payor/Plan Precert #   [facility] COMMERCIAL/GENERIC COMMERCIAL    Subscriber Subscriber #   Raechal, Mcevers [id number]   Address Phone    [place] [address] [phone]

## 2015-04-15 NOTE — ED Notes (Signed)
Per EMS: pt from home c/o generalized weakness on Thursday and was taken to PCP and found nothing; pt became more generally weak today and was unable to walk with her walker per norm; pt denies complaints and has hx of dementia per norm at present; IV 20G L AC

## 2015-04-15 NOTE — Discharge Instructions (Signed)
Continue regular home medications as directed. Follow-up with your primary care physician. Someone from home health facility should be contacting you in the next day or so to arrange a home health evaluation.  If you do not hear from them, please follow-up on this. Return here for any new or worsening symptoms.

## 2015-04-16 DIAGNOSIS — R4781 Slurred speech: Secondary | ICD-10-CM | POA: Diagnosis not present

## 2015-04-16 DIAGNOSIS — F0390 Unspecified dementia without behavioral disturbance: Secondary | ICD-10-CM | POA: Diagnosis not present

## 2015-04-16 DIAGNOSIS — Z9181 History of falling: Secondary | ICD-10-CM | POA: Diagnosis not present

## 2015-04-16 DIAGNOSIS — R2689 Other abnormalities of gait and mobility: Secondary | ICD-10-CM | POA: Diagnosis not present

## 2015-04-16 DIAGNOSIS — M1991 Primary osteoarthritis, unspecified site: Secondary | ICD-10-CM | POA: Diagnosis not present

## 2015-04-16 DIAGNOSIS — R32 Unspecified urinary incontinence: Secondary | ICD-10-CM | POA: Diagnosis not present

## 2015-04-16 DIAGNOSIS — I10 Essential (primary) hypertension: Secondary | ICD-10-CM | POA: Diagnosis not present

## 2015-04-16 DIAGNOSIS — R471 Dysarthria and anarthria: Secondary | ICD-10-CM | POA: Diagnosis not present

## 2015-04-16 DIAGNOSIS — Z7901 Long term (current) use of anticoagulants: Secondary | ICD-10-CM | POA: Diagnosis not present

## 2015-04-18 DIAGNOSIS — R2689 Other abnormalities of gait and mobility: Secondary | ICD-10-CM | POA: Diagnosis not present

## 2015-04-18 DIAGNOSIS — I10 Essential (primary) hypertension: Secondary | ICD-10-CM | POA: Diagnosis not present

## 2015-04-18 DIAGNOSIS — R4781 Slurred speech: Secondary | ICD-10-CM | POA: Diagnosis not present

## 2015-04-18 DIAGNOSIS — F0390 Unspecified dementia without behavioral disturbance: Secondary | ICD-10-CM | POA: Diagnosis not present

## 2015-04-18 DIAGNOSIS — R471 Dysarthria and anarthria: Secondary | ICD-10-CM | POA: Diagnosis not present

## 2015-04-18 DIAGNOSIS — M1991 Primary osteoarthritis, unspecified site: Secondary | ICD-10-CM | POA: Diagnosis not present

## 2015-04-19 DIAGNOSIS — R2689 Other abnormalities of gait and mobility: Secondary | ICD-10-CM | POA: Diagnosis not present

## 2015-04-19 DIAGNOSIS — M1991 Primary osteoarthritis, unspecified site: Secondary | ICD-10-CM | POA: Diagnosis not present

## 2015-04-19 DIAGNOSIS — R4781 Slurred speech: Secondary | ICD-10-CM | POA: Diagnosis not present

## 2015-04-19 DIAGNOSIS — I10 Essential (primary) hypertension: Secondary | ICD-10-CM | POA: Diagnosis not present

## 2015-04-19 DIAGNOSIS — R471 Dysarthria and anarthria: Secondary | ICD-10-CM | POA: Diagnosis not present

## 2015-04-19 DIAGNOSIS — F0390 Unspecified dementia without behavioral disturbance: Secondary | ICD-10-CM | POA: Diagnosis not present

## 2015-04-20 ENCOUNTER — Encounter (HOSPITAL_COMMUNITY)
Admission: EM | Disposition: A | Discharge status: Skilled Nursing Facility | Payer: Self-pay | Source: Home / Self Care | Attending: Pulmonary Disease

## 2015-04-20 ENCOUNTER — Encounter (HOSPITAL_COMMUNITY): Payer: Self-pay | Admitting: *Deleted

## 2015-04-20 ENCOUNTER — Emergency Department (HOSPITAL_COMMUNITY): Payer: Medicare Other

## 2015-04-20 ENCOUNTER — Inpatient Hospital Stay (HOSPITAL_COMMUNITY)
Admission: EM | Admit: 2015-04-20 | Discharge: 2015-04-24 | DRG: 308 | Disposition: A | Discharge status: Skilled Nursing Facility | Payer: Medicare Other | Attending: Internal Medicine | Admitting: Internal Medicine

## 2015-04-20 DIAGNOSIS — E875 Hyperkalemia: Secondary | ICD-10-CM | POA: Diagnosis present

## 2015-04-20 DIAGNOSIS — G9340 Encephalopathy, unspecified: Secondary | ICD-10-CM

## 2015-04-20 DIAGNOSIS — N183 Chronic kidney disease, stage 3 (moderate): Secondary | ICD-10-CM | POA: Diagnosis present

## 2015-04-20 DIAGNOSIS — M810 Age-related osteoporosis without current pathological fracture: Secondary | ICD-10-CM | POA: Diagnosis not present

## 2015-04-20 DIAGNOSIS — I482 Chronic atrial fibrillation, unspecified: Secondary | ICD-10-CM | POA: Diagnosis present

## 2015-04-20 DIAGNOSIS — E860 Dehydration: Secondary | ICD-10-CM | POA: Diagnosis not present

## 2015-04-20 DIAGNOSIS — N390 Urinary tract infection, site not specified: Secondary | ICD-10-CM | POA: Diagnosis present

## 2015-04-20 DIAGNOSIS — R680 Hypothermia, not associated with low environmental temperature: Secondary | ICD-10-CM | POA: Diagnosis present

## 2015-04-20 DIAGNOSIS — E871 Hypo-osmolality and hyponatremia: Secondary | ICD-10-CM | POA: Diagnosis not present

## 2015-04-20 DIAGNOSIS — E569 Vitamin deficiency, unspecified: Secondary | ICD-10-CM | POA: Diagnosis not present

## 2015-04-20 DIAGNOSIS — R471 Dysarthria and anarthria: Secondary | ICD-10-CM | POA: Diagnosis not present

## 2015-04-20 DIAGNOSIS — E039 Hypothyroidism, unspecified: Secondary | ICD-10-CM | POA: Diagnosis not present

## 2015-04-20 DIAGNOSIS — R570 Cardiogenic shock: Secondary | ICD-10-CM | POA: Diagnosis present

## 2015-04-20 DIAGNOSIS — E861 Hypovolemia: Secondary | ICD-10-CM | POA: Diagnosis present

## 2015-04-20 DIAGNOSIS — R2689 Other abnormalities of gait and mobility: Secondary | ICD-10-CM | POA: Diagnosis not present

## 2015-04-20 DIAGNOSIS — R739 Hyperglycemia, unspecified: Secondary | ICD-10-CM | POA: Diagnosis present

## 2015-04-20 DIAGNOSIS — R32 Unspecified urinary incontinence: Secondary | ICD-10-CM | POA: Diagnosis present

## 2015-04-20 DIAGNOSIS — R1310 Dysphagia, unspecified: Secondary | ICD-10-CM | POA: Diagnosis present

## 2015-04-20 DIAGNOSIS — R41841 Cognitive communication deficit: Secondary | ICD-10-CM | POA: Diagnosis not present

## 2015-04-20 DIAGNOSIS — G912 (Idiopathic) normal pressure hydrocephalus: Secondary | ICD-10-CM | POA: Diagnosis present

## 2015-04-20 DIAGNOSIS — D649 Anemia, unspecified: Secondary | ICD-10-CM | POA: Diagnosis present

## 2015-04-20 DIAGNOSIS — M1991 Primary osteoarthritis, unspecified site: Secondary | ICD-10-CM | POA: Diagnosis not present

## 2015-04-20 DIAGNOSIS — I499 Cardiac arrhythmia, unspecified: Secondary | ICD-10-CM | POA: Diagnosis not present

## 2015-04-20 DIAGNOSIS — R001 Bradycardia, unspecified: Principal | ICD-10-CM

## 2015-04-20 DIAGNOSIS — J9811 Atelectasis: Secondary | ICD-10-CM | POA: Diagnosis present

## 2015-04-20 DIAGNOSIS — T17908A Unspecified foreign body in respiratory tract, part unspecified causing other injury, initial encounter: Secondary | ICD-10-CM

## 2015-04-20 DIAGNOSIS — F0390 Unspecified dementia without behavioral disturbance: Secondary | ICD-10-CM | POA: Diagnosis present

## 2015-04-20 DIAGNOSIS — I4891 Unspecified atrial fibrillation: Secondary | ICD-10-CM | POA: Diagnosis not present

## 2015-04-20 DIAGNOSIS — I10 Essential (primary) hypertension: Secondary | ICD-10-CM | POA: Diagnosis present

## 2015-04-20 DIAGNOSIS — N179 Acute kidney failure, unspecified: Secondary | ICD-10-CM | POA: Diagnosis present

## 2015-04-20 DIAGNOSIS — M6281 Muscle weakness (generalized): Secondary | ICD-10-CM | POA: Diagnosis not present

## 2015-04-20 DIAGNOSIS — Z66 Do not resuscitate: Secondary | ICD-10-CM | POA: Diagnosis present

## 2015-04-20 DIAGNOSIS — D696 Thrombocytopenia, unspecified: Secondary | ICD-10-CM | POA: Diagnosis present

## 2015-04-20 DIAGNOSIS — Z79899 Other long term (current) drug therapy: Secondary | ICD-10-CM | POA: Diagnosis not present

## 2015-04-20 DIAGNOSIS — E872 Acidosis: Secondary | ICD-10-CM | POA: Diagnosis present

## 2015-04-20 DIAGNOSIS — I480 Paroxysmal atrial fibrillation: Secondary | ICD-10-CM | POA: Diagnosis not present

## 2015-04-20 DIAGNOSIS — E638 Other specified nutritional deficiencies: Secondary | ICD-10-CM | POA: Diagnosis not present

## 2015-04-20 DIAGNOSIS — E785 Hyperlipidemia, unspecified: Secondary | ICD-10-CM | POA: Diagnosis present

## 2015-04-20 DIAGNOSIS — E878 Other disorders of electrolyte and fluid balance, not elsewhere classified: Secondary | ICD-10-CM | POA: Diagnosis present

## 2015-04-20 DIAGNOSIS — J302 Other seasonal allergic rhinitis: Secondary | ICD-10-CM | POA: Diagnosis not present

## 2015-04-20 DIAGNOSIS — R4781 Slurred speech: Secondary | ICD-10-CM | POA: Diagnosis not present

## 2015-04-20 DIAGNOSIS — Z7901 Long term (current) use of anticoagulants: Secondary | ICD-10-CM

## 2015-04-20 HISTORY — PX: CARDIAC CATHETERIZATION: SHX172

## 2015-04-20 LAB — BASIC METABOLIC PANEL
ANION GAP: 8 (ref 5–15)
BUN: 46 mg/dL — ABNORMAL HIGH (ref 6–20)
CALCIUM: 9.7 mg/dL (ref 8.9–10.3)
CO2: 24 mmol/L (ref 22–32)
Chloride: 92 mmol/L — ABNORMAL LOW (ref 101–111)
Creatinine, Ser: 1.33 mg/dL — ABNORMAL HIGH (ref 0.44–1.00)
GFR calc non Af Amer: 34 mL/min — ABNORMAL LOW (ref >60)
GFR, EST AFRICAN AMERICAN: 39 mL/min — AB (ref >60)
Glucose, Bld: 179 mg/dL — ABNORMAL HIGH (ref 65–99)
Potassium: 5.2 mmol/L — ABNORMAL HIGH (ref 3.5–5.1)
Sodium: 124 mmol/L — ABNORMAL LOW (ref 135–145)

## 2015-04-20 LAB — CBC WITH DIFFERENTIAL/PLATELET
BASOS ABS: 0 10*3/uL (ref 0.0–0.1)
BASOS PCT: 0 %
Eosinophils Absolute: 0.1 10*3/uL (ref 0.0–0.7)
Eosinophils Relative: 1 %
HEMATOCRIT: 34.8 % — AB (ref 36.0–46.0)
HEMOGLOBIN: 11.9 g/dL — AB (ref 12.0–15.0)
Lymphocytes Relative: 11 %
Lymphs Abs: 0.8 10*3/uL (ref 0.7–4.0)
MCH: 29.1 pg (ref 26.0–34.0)
MCHC: 34.2 g/dL (ref 30.0–36.0)
MCV: 85.1 fL (ref 78.0–100.0)
Monocytes Absolute: 0.5 10*3/uL (ref 0.1–1.0)
Monocytes Relative: 7 %
NEUTROS ABS: 5.4 10*3/uL (ref 1.7–7.7)
NEUTROS PCT: 81 %
Platelets: 137 10*3/uL — ABNORMAL LOW (ref 150–400)
RBC: 4.09 MIL/uL (ref 3.87–5.11)
RDW: 14.6 % (ref 11.5–15.5)
WBC: 6.7 10*3/uL (ref 4.0–10.5)

## 2015-04-20 LAB — I-STAT ARTERIAL BLOOD GAS, ED
Acid-Base Excess: 3 mmol/L — ABNORMAL HIGH (ref 0.0–2.0)
BICARBONATE: 28.7 meq/L — AB (ref 20.0–24.0)
O2 SAT: 96 %
PCO2 ART: 37.6 mmHg (ref 35.0–45.0)
PO2 ART: 58 mmHg — AB (ref 80.0–100.0)
Patient temperature: 89
TCO2: 30 mmol/L (ref 0–100)
pH, Arterial: 7.467 — ABNORMAL HIGH (ref 7.350–7.450)

## 2015-04-20 LAB — I-STAT TROPONIN, ED: TROPONIN I, POC: 0.02 ng/mL (ref 0.00–0.08)

## 2015-04-20 LAB — LACTIC ACID, PLASMA: Lactic Acid, Venous: 2.2 mmol/L (ref 0.5–2.0)

## 2015-04-20 SURGERY — TEMPORARY PACEMAKER

## 2015-04-20 MED ORDER — DEXTROSE 5 % IV SOLN
1.0000 g | INTRAVENOUS | Status: DC
  Administered 2015-04-21: 1 g via INTRAVENOUS
  Filled 2015-04-20 (×2): qty 10

## 2015-04-20 MED ORDER — INSULIN ASPART 100 UNIT/ML ~~LOC~~ SOLN: 1.0000 [IU] | SUBCUTANEOUS | Status: DC

## 2015-04-20 MED ORDER — ONDANSETRON HCL 4 MG/2ML IJ SOLN: 4.0000 mg | Freq: Four times a day (QID) | INTRAMUSCULAR | Status: DC | PRN

## 2015-04-20 MED ORDER — ENOXAPARIN SODIUM 30 MG/0.3ML ~~LOC~~ SOLN
30.0000 mg | SUBCUTANEOUS | Status: DC
  Administered 2015-04-21 – 2015-04-23 (×3): 30 mg via SUBCUTANEOUS
  Filled 2015-04-20 (×3): qty 0.3

## 2015-04-20 MED ORDER — DEXTROSE 5 % IV SOLN
500.0000 mg | INTRAVENOUS | Status: DC
  Filled 2015-04-20: qty 500

## 2015-04-20 MED ORDER — ACETAMINOPHEN 325 MG PO TABS
650.0000 mg | ORAL_TABLET | ORAL | Status: DC | PRN
  Administered 2015-04-23: 650 mg via ORAL
  Filled 2015-04-20: qty 2

## 2015-04-20 MED ORDER — FENTANYL CITRATE (PF) 100 MCG/2ML IJ SOLN
25.0000 ug | Freq: Once | INTRAMUSCULAR | Status: AC
  Administered 2015-04-20: 25 ug via INTRAVENOUS
  Filled 2015-04-20: qty 2

## 2015-04-20 MED ORDER — SODIUM CHLORIDE 0.9 % IV SOLN
1.0000 g | Freq: Once | INTRAVENOUS | Status: AC
  Administered 2015-04-20: 1 g via INTRAVENOUS
  Filled 2015-04-20: qty 10

## 2015-04-20 MED ORDER — ATROPINE SULFATE 0.1 MG/ML IJ SOLN
0.5000 mg | Freq: Once | INTRAMUSCULAR | Status: AC
  Administered 2015-04-20: 0.5 mg via INTRAVENOUS

## 2015-04-20 MED ORDER — DEXTROSE 5 % IV SOLN
500.0000 mg | Freq: Once | INTRAVENOUS | Status: AC
  Administered 2015-04-20: 500 mg via INTRAVENOUS
  Filled 2015-04-20: qty 500

## 2015-04-20 MED ORDER — ATROPINE SULFATE 0.1 MG/ML IJ SOLN
1.0000 mg | Freq: Once | INTRAMUSCULAR | Status: AC
  Administered 2015-04-20: 0.5 mg via INTRAVENOUS

## 2015-04-20 MED ORDER — LIDOCAINE HCL (PF) 1 % IJ SOLN
INTRAMUSCULAR | Status: DC | PRN
  Administered 2015-04-20: 25 mL

## 2015-04-20 MED ORDER — ATROPINE SULFATE 0.1 MG/ML IJ SOLN: 0.5000 mg | Freq: Once | INTRAMUSCULAR | Status: DC

## 2015-04-20 MED ORDER — SODIUM CHLORIDE 0.9 % IV SOLN
250.0000 mL | INTRAVENOUS | Status: DC | PRN
  Administered 2015-04-20: 250 mL via INTRAVENOUS

## 2015-04-20 MED ORDER — FENTANYL CITRATE (PF) 100 MCG/2ML IJ SOLN: 25.0000 ug | Freq: Once | INTRAMUSCULAR | Status: DC

## 2015-04-20 SURGICAL SUPPLY — 4 items
CATH S G BIP PACING (SET/KITS/TRAYS/PACK) ×1 IMPLANT
PACK CARDIAC CATHETERIZATION (CUSTOM PROCEDURE TRAY) ×1 IMPLANT
SHEATH PINNACLE 6F 10CM (SHEATH) ×1 IMPLANT
SLEEVE REPOSITIONING LENGTH 30 (MISCELLANEOUS) ×1 IMPLANT

## 2015-04-20 NOTE — ED Notes (Signed)
Atropine 0.5 mg given iv  For heart rate lower than 20

## 2015-04-20 NOTE — Interval H&P Note (Signed)
History and Physical Interval Note:  04/20/2015 11:42 PM  Katherine Miranda  has presented today for surgery, with the diagnosis of chb  The various methods of treatment have been discussed with the patient and family. After consideration of risks, benefits and other options for treatment, the patient has consented to  Procedure(s): Temporary Pacemaker (N/A) as a surgical intervention .  The patient's history has been reviewed, patient examined, no change in status, stable for surgery.  I have reviewed the patient's chart and labs.  Questions were answered to the patient's satisfaction.     Quay Burow

## 2015-04-20 NOTE — ED Provider Notes (Signed)
CSN: [id number]     Arrival date & time 04/20/15  1912 History   First MD Initiated Contact with Patient 04/20/15 1928     Chief Complaint  Patient presents with  . Bradycardia     (Consider location/radiation/quality/duration/timing/severity/associated sxs/prior Treatment) Patient is a 81 y.o. female presenting with altered mental status. The history is provided by the patient and a relative.  Altered Mental Status Presenting symptoms: lethargy   Presenting symptoms: no confusion   Severity:  Moderate Most recent episode:  Today Episode history:  Multiple Timing:  Intermittent Progression:  Waxing and waning Chronicity:  New Context: recent illness   Associated symptoms: light-headedness   Associated symptoms: no abdominal pain, no fever, no headaches, no nausea, no palpitations, no rash, no seizures and no vomiting     Past Medical History  Diagnosis Date  . Hypertension   . Dementia   . Arthritis   . A-fib [facility])    Past Surgical History  Procedure Laterality Date  . Knee arthroscopy    . Back surgery     No family history on file. Social History  Substance Use Topics  . Smoking status: Never Smoker   . Smokeless tobacco: Never Used  . Alcohol Use: No   OB History    No data available     Review of Systems  Constitutional: Negative for fever.  HENT: Negative for rhinorrhea and sore throat.   Eyes: Negative for visual disturbance.  Respiratory: Negative for chest tightness and shortness of breath.   Cardiovascular: Negative for chest pain and palpitations.  Gastrointestinal: Negative for nausea, vomiting, abdominal pain and constipation.  Genitourinary: Negative for dysuria and hematuria.  Musculoskeletal: Negative for back pain and neck pain.  Skin: Negative for rash.  Neurological: Positive for light-headedness. Negative for dizziness, seizures and headaches.  Psychiatric/Behavioral: Negative for confusion.  All other systems reviewed and are  negative.  Allergies  Review of patient's allergies indicates no known allergies.  Home Medications   Prior to Admission medications   Medication Sig Start Date End Date Taking? Authorizing Provider  alendronate (FOSAMAX) 70 MG tablet Take 70 mg by mouth every Tuesday. Take with a full glass of water on an empty stomach.   Yes Historical Provider, MD  amLODipine (NORVASC) 10 MG tablet Take 10 mg by mouth at bedtime.   Yes Historical Provider, MD  Ascorbic Acid (VITAMIN C) 1000 MG tablet Take 1,000 mg by mouth 2 (two) times daily.    Yes Historical Provider, MD  CALCIUM PO Take 1 tablet by mouth 2 (two) times daily.    Yes Historical Provider, MD  carvedilol (COREG) 12.5 MG tablet TAKE 1 TABLET TWICE A DAY Patient taking differently: TAKE 1/2 TABLET TWICE A DAY 02/05/15  Yes Evans Lance, MD  Cholecalciferol (VITAMIN D) 2000 UNITS tablet Take 2,000 Units by mouth 2 (two) times daily.   Yes Historical Provider, MD  Coenzyme Q10 (COQ10) 200 MG CAPS Take 200 mg by mouth daily.   Yes Historical Provider, MD  CRANBERRY PO Take 300 mg by mouth daily.    Yes Historical Provider, MD  donepezil (ARICEPT) 10 MG tablet Take 10 mg by mouth at bedtime.   Yes Historical Provider, MD  ELIQUIS 2.5 MG TABS tablet TAKE 1 TABLET TWICE A DAY 12/28/14  Yes Evans Lance, MD  iron polysaccharides (NIFEREX) 150 MG capsule Take 150 mg by mouth daily.   Yes Historical Provider, MD  loratadine (CLARITIN) 10 MG tablet Take 10 mg by  mouth at bedtime.   Yes Historical Provider, MD  memantine (NAMENDA) 10 MG tablet Take 10 mg by mouth at bedtime.    Yes Historical Provider, MD  mirabegron ER (MYRBETRIQ) 25 MG TB24 tablet Take 25 mg by mouth daily.   Yes Historical Provider, MD  Omega-3 Fatty Acids (FISH OIL) 1000 MG CAPS Take 1,000 mg by mouth daily.   Yes Historical Provider, MD  timolol (BETIMOL) 0.5 % ophthalmic solution Place 1 drop into the right eye daily.    Yes Historical Provider, MD  travoprost, benzalkonium,  (TRAVATAN) 0.004 % ophthalmic solution Place 1 drop into both eyes daily after supper.    Yes Historical Provider, MD  amLODipine (NORVASC) 5 MG tablet Take 2 tablets (10 mg total) by mouth at bedtime. Patient not taking: Reported on 04/20/2015 10/10/12   Bobby Rumpf [facility], PA-C   BP 43/15 mmHg  Resp 20  Ht 5' 3.5" (1.613 m)  Wt 152 lb (68.947 kg)  BMI 26.50 kg/m2 Physical Exam  Constitutional: She is oriented to person, place, and time. She appears well-developed and well-nourished. No distress.  HENT:  Head: Normocephalic and atraumatic.  Mouth/Throat: Oropharynx is clear and moist.  Eyes: EOM are normal. Pupils are equal, round, and reactive to light.  Neck: Neck supple. No JVD present.  Cardiovascular: Normal rate, normal heart sounds and intact distal pulses.  An irregularly irregular rhythm present. Exam reveals no gallop.   No murmur heard. Pulses:      Radial pulses are 2+ on the right side, and 2+ on the left side.       Dorsalis pedis pulses are 2+ on the right side, and 2+ on the left side.       Posterior tibial pulses are 2+ on the right side, and 2+ on the left side.  Pulmonary/Chest: Effort normal and breath sounds normal. Bradypnea noted. She has no wheezes. She has no rales.  Abdominal: Soft. She exhibits no distension. There is no tenderness.  Musculoskeletal: Normal range of motion. She exhibits no tenderness.  Neurological: She is alert and oriented to person, place, and time. No cranial nerve deficit. She exhibits normal muscle tone. GCS eye subscore is 4. GCS verbal subscore is 4. GCS motor subscore is 6.  Skin: Skin is dry. No rash noted.  Cool extremities  Psychiatric: Her behavior is normal.    ED Course  Procedures  None   Labs Review Labs Reviewed  CBC WITH DIFFERENTIAL/PLATELET - Abnormal; Notable for the following:    Hemoglobin 11.9 (*)    HCT 34.8 (*)    Platelets 137 (*)    All other components within normal limits  BASIC METABOLIC PANEL -  Abnormal; Notable for the following:    Sodium 124 (*)    Potassium 5.2 (*)    Chloride 92 (*)    Glucose, Bld 179 (*)    BUN 46 (*)    Creatinine, Ser 1.33 (*)    GFR calc non Af Amer 34 (*)    GFR calc Af Amer 39 (*)    All other components within normal limits  LACTIC ACID, PLASMA - Abnormal; Notable for the following:    Lactic Acid, Venous 2.2 (*)    All other components within normal limits  I-STAT ARTERIAL BLOOD GAS, ED - Abnormal; Notable for the following:    pH, Arterial 7.467 (*)    pO2, Arterial 58.0 (*)    Bicarbonate 28.7 (*)    Acid-Base Excess 3.0 (*)  All other components within normal limits  CULTURE, BLOOD (ROUTINE X 2)  CULTURE, BLOOD (ROUTINE X 2)  LACTIC ACID, PLASMA  MAGNESIUM  BLOOD GAS, ARTERIAL  PROCALCITONIN  PROCALCITONIN  URINALYSIS, ROUTINE W REFLEX MICROSCOPIC (NOT AT [facility])  CHLORIDE, URINE, RANDOM  SODIUM, URINE, RANDOM  CALCIUM / CREATININE RATIO, URINE  CBC  BASIC METABOLIC PANEL  MAGNESIUM  PHOSPHORUS  TSH  CORTISOL-AM, BLOOD  I-STAT TROPOININ, ED    Imaging Review Dg Chest Portable 1 View  04/20/2015  CLINICAL DATA:  Bradycardia EXAM: PORTABLE CHEST 1 VIEW COMPARISON:  04/15/2015 chest radiograph. FINDINGS: Pacer pad overlies the left chest. Stable cardiomediastinal silhouette with mild cardiomegaly. No pneumothorax. No pleural effusion. No pulmonary edema. Mild patchy bibasilar lung opacities. IMPRESSION: 1. Stable mild cardiomegaly without pulmonary edema. 2. Mild patchy bibasilar lung opacities, favor atelectasis. Electronically Signed   By: Ilona Sorrel M.D.   On: 04/20/2015 19:55   I have personally reviewed and evaluated these images and lab results as part of my medical decision-making.  MDM   Final diagnoses:  Symptomatic bradycardia    81 year old female with a history of atrial fibrillation on Eliquis presents with family for concern for altered mental status. The daughter states that the patient was receiving home  physical therapy and was having episodes of "zoning out."  She denies chest pain, SOB, palpitations or syncope. EMS called and noted AFIB with bradycardia down to 20s.  On the monitor here she is in AFIB with rate in the 20s down to 19 with BP 80s/40s. I independently reviewed the EKGs. First one showed AFIB, rate 22, QT 651. Atropine 0.5 mg x 3 given with no improvmeent. BP remains hypotensive. After first atropine, 2nd EKG showed junctional rhythm, rate 70 with no acute ischemic changes.  Mental status okay but not deteriorating. Began subcutaneous pacing at rate 60. Cards consulted. Lact 2.2 temp 86 rectally. Bare hugger placed. Warmed to 26F. Blood cultures obtained. ?PNA on CXR. Azithromycin given for CAP coverage. Will admit to ICU while cards plans for urgent pacemaker placement. Family updated. Transported to ICU.  Discussed with Dr. Reather Converse.   Gustavus Bryant, MD 04/21/15 0005  Elnora Morrison, MD 04/21/15 [phone]

## 2015-04-20 NOTE — ED Notes (Signed)
Rep;ort given to rn on 2h 

## 2015-04-20 NOTE — H&P (View-Only) (Signed)
Admit date: 04/20/2015 Referring Physician  Dr. Reather Converse Primary Cardiologist  Dr. Lovena Le Reason for Consultation  Bradycardia  HPI: This is a 81yo WF with a history of chronic atrial fibrillation on Apixaban, HTN and dementia who, at her baseline, lives alone and is able to feed herself and do her ADL's with help and ambulate with a walker.  She was recently seen in the ER 11/14 after he daughter noted that she was talking slower and had slurred speech with slower ambulation.  CT showed hydrocephalus and PT was set up as well as home health and no further testing was recommended and she was sent home.  Her daughter states that she had a good day on Thursday but then yesterday she seemed slower again with her walking and more confused.  Her daughter noted earlier today that she had an episode where she seemed to "space out" for a few seconds and jerk and then she was fine.  This happened several times and she called EMS and on their arrival she was bradycardic at 24 bpm.  She was given 0.5mg  Atropine with improvement in her HR to 70 and then went back down in the 20's.  She was given Atropine again with HR initially increasing and then dropping back in the 20-30's. She was found to be hypothermic with a temp of 85 degrees F and is on a bear hugger.   She was then placed on the Zoll and is currently externally paced with no underlying rhythm at a pacing rate of 30bpm.  Her daughter says that she has not had any fever or chills but has had a cough.  Cardiology is now asked to consult for further treatment of her bradycardia.    PMH:   Past Medical History  Diagnosis Date  . Hypertension   . Dementia   . Arthritis   . A-fib (HCC)      PSH:   Past Surgical History  Procedure Laterality Date  . Knee arthroscopy    . Back surgery      Allergies:  Review of patient's allergies indicates no known allergies. Prior to Admit Meds:   (Not in a hospital admission) Fam HX:   No family history on  file. Social HX:    Social History   Social History  . Marital Status: Widowed    Spouse Name: N/A  . Number of Children: N/A  . Years of Education: N/A   Occupational History  . Not on file.   Social History Main Topics  . Smoking status: Never Smoker   . Smokeless tobacco: Never Used  . Alcohol Use: No  . Drug Use: No  . Sexual Activity: No   Other Topics Concern  . Not on file   Social History Narrative     ROS:  All 11 ROS were addressed and are negative except what is stated in the HPI  Physical Exam: Blood pressure 106/64, pulse 58, resp. rate 24, height 5' 3.5" (1.613 m), weight 68.947 kg (152 lb), SpO2 99 %.    General: Well developed, well nourished, in no acute distress Head: Eyes PERRLA, No xanthomas.   Normal cephalic and atramatic  Lungs:   Clear bilaterally to auscultation and percussion. Heart:   HRRR S1 S2 Pulses are 2+ & equal.            No carotid bruit. No JVD.  No abdominal bruits. No femoral bruits. Abdomen: Bowel sounds are positive, abdomen soft and non-tender without masses  Extremities:   No clubbing, cyanosis or edema.  DP +1 Neuro: Alert and oriented X 3. Psych:  Good affect, responds appropriately    Labs:   Lab Results  Component Value Date   WBC 6.7 04/20/2015   HGB 11.9* 04/20/2015   HCT 34.8* 04/20/2015   MCV 85.1 04/20/2015   PLT 137* 04/20/2015    Recent Labs Lab 04/15/15 1254 04/20/15 2022  NA 135 124*  K 4.6 5.2*  CL 95* 92*  CO2 29 24  BUN 29* 46*  CREATININE 1.19* 1.33*  CALCIUM 10.0 9.7  PROT 7.3  --   BILITOT 0.6  --   ALKPHOS 81  --   ALT 80*  --   AST 68*  --   GLUCOSE 88 179*   No results found for: PTT Lab Results  Component Value Date   INR 1.29 04/15/2015   INR 1.05 10/06/2012   INR 1.4 10/25/2007   Lab Results  Component Value Date   TROPONINI <0.30 06/08/2012    No results found for: CHOL No results found for: HDL No results found for: LDLCALC No results found for: TRIG No results  found for: CHOLHDL No results found for: LDLDIRECT    Radiology:  Dg Chest Portable 1 View  04/20/2015  CLINICAL DATA:  Bradycardia EXAM: PORTABLE CHEST 1 VIEW COMPARISON:  04/15/2015 chest radiograph. FINDINGS: Pacer pad overlies the left chest. Stable cardiomediastinal silhouette with mild cardiomegaly. No pneumothorax. No pleural effusion. No pulmonary edema. Mild patchy bibasilar lung opacities. IMPRESSION: 1. Stable mild cardiomegaly without pulmonary edema. 2. Mild patchy bibasilar lung opacities, favor atelectasis. Electronically Signed   By: Ilona Sorrel M.D.   On: 04/20/2015 19:55    EKG:  Profound bradycardia with  Low junctional escape at 24bpm  ASSESSMENT/PLAN: 1.  Profound bradycardia with cardiogenic shock - BP now stable on Zoll with external pacing at 60bpm. She is on Coreg as well as timolol eye gtts.   I have discussed code status with the family.  The patient is alert and communicates but does have an element of dementia.  She has a living will.  The family wished to proceed with temporary pacemaker in hopes that this would be a temporary fix until the Beta blockers wore out of her system and hopefully bradycardia would resolve.  She had been able to get around her home and converse with her family.  The children have discussed code status and wish to maker her DNR but want to proceed with pacemaker.  Will proceed with temporary pacemaker tonight by Dr. Gwenlyn Found and then EP will see in the am.  Risks of temp pacer discussed with family including risk of infection, bleeding, ventricular perforation and pericardial effusion with tamponade.  Family agrees to proceed.  They did state that if complications arise during temp pacer insertion and patient cardiac arrests, that no chest compressions, intubation or defibrillation be performed. 2.  Hypothermia ? Etiology - ? Underlying sepsis.  She has had a cough for a few days and chest xray with opacities.  Per CCM 3.  Hyponatremia - her sodium  was 135 a few days ago. 4.  NPH - recently diagnosed with Head CT - no further workup per Neuro. 5.  HTN 6.  Chronic atrial fibrillation on Apixaban  Sueanne Margarita, MD  04/20/2015  9:27 PM

## 2015-04-20 NOTE — ED Notes (Signed)
Atropine 0.5 iv drug will not scan

## 2015-04-20 NOTE — ED Notes (Signed)
The pt arrived by gems from home  With a slow heart rate all day.  Hx of af  The daughter reports that she has been zoning out today.   Pt alert skin cold but dry.  She answers some questions but not all   Poss hard of hearing

## 2015-04-20 NOTE — ED Notes (Signed)
Atropine repeated.  Heart rate is increasing again and she has a bp

## 2015-04-20 NOTE — ED Notes (Signed)
Dr Radford Pax at the bedside

## 2015-04-20 NOTE — ED Notes (Signed)
Pain med given for pain from being paced

## 2015-04-20 NOTE — ED Notes (Signed)
Atropine 0.5 mg given iv

## 2015-04-20 NOTE — Consult Note (Addendum)
Admit date: 04/20/2015 Referring Physician  Dr. Reather Converse Primary Cardiologist  Dr. Lovena Le Reason for Consultation  Bradycardia  HPI: This is a 81yo WF with a history of chronic atrial fibrillation on Apixaban, HTN and dementia who, at her baseline, lives alone and is able to feed herself and do her ADL's with help and ambulate with a walker.  She was recently seen in the ER 11/14 after he daughter noted that she was talking slower and had slurred speech with slower ambulation.  CT showed hydrocephalus and PT was set up as well as home health and no further testing was recommended and she was sent home.  Her daughter states that she had a good day on Thursday but then yesterday she seemed slower again with her walking and more confused.  Her daughter noted earlier today that she had an episode where she seemed to "space out" for a few seconds and jerk and then she was fine.  This happened several times and she called EMS and on their arrival she was bradycardic at 24 bpm.  She was given 0.5mg  Atropine with improvement in her HR to 70 and then went back down in the 20's.  She was given Atropine again with HR initially increasing and then dropping back in the 20-30's. She was found to be hypothermic with a temp of 85 degrees F and is on a bear hugger.   She was then placed on the Zoll and is currently externally paced with no underlying rhythm at a pacing rate of 30bpm.  Her daughter says that she has not had any fever or chills but has had a cough.  Cardiology is now asked to consult for further treatment of her bradycardia.    PMH:   Past Medical History  Diagnosis Date  . Hypertension   . Dementia   . Arthritis   . A-fib (HCC)      PSH:   Past Surgical History  Procedure Laterality Date  . Knee arthroscopy    . Back surgery      Allergies:  Review of patient's allergies indicates no known allergies. Prior to Admit Meds:   (Not in a hospital admission) Fam HX:   No family history on  file. Social HX:    Social History   Social History  . Marital Status: Widowed    Spouse Name: N/A  . Number of Children: N/A  . Years of Education: N/A   Occupational History  . Not on file.   Social History Main Topics  . Smoking status: Never Smoker   . Smokeless tobacco: Never Used  . Alcohol Use: No  . Drug Use: No  . Sexual Activity: No   Other Topics Concern  . Not on file   Social History Narrative     ROS:  All 11 ROS were addressed and are negative except what is stated in the HPI  Physical Exam: Blood pressure 106/64, pulse 58, resp. rate 24, height 5' 3.5" (1.613 m), weight 68.947 kg (152 lb), SpO2 99 %.    General: Well developed, well nourished, in no acute distress Head: Eyes PERRLA, No xanthomas.   Normal cephalic and atramatic  Lungs:   Clear bilaterally to auscultation and percussion. Heart:   HRRR S1 S2 Pulses are 2+ & equal.            No carotid bruit. No JVD.  No abdominal bruits. No femoral bruits. Abdomen: Bowel sounds are positive, abdomen soft and non-tender without masses  Extremities:   No clubbing, cyanosis or edema.  DP +1 Neuro: Alert and oriented X 3. Psych:  Good affect, responds appropriately    Labs:   Lab Results  Component Value Date   WBC 6.7 04/20/2015   HGB 11.9* 04/20/2015   HCT 34.8* 04/20/2015   MCV 85.1 04/20/2015   PLT 137* 04/20/2015    Recent Labs Lab 04/15/15 1254 04/20/15 2022  NA 135 124*  K 4.6 5.2*  CL 95* 92*  CO2 29 24  BUN 29* 46*  CREATININE 1.19* 1.33*  CALCIUM 10.0 9.7  PROT 7.3  --   BILITOT 0.6  --   ALKPHOS 81  --   ALT 80*  --   AST 68*  --   GLUCOSE 88 179*   No results found for: PTT Lab Results  Component Value Date   INR 1.29 04/15/2015   INR 1.05 10/06/2012   INR 1.4 10/25/2007   Lab Results  Component Value Date   TROPONINI <0.30 06/08/2012    No results found for: CHOL No results found for: HDL No results found for: LDLCALC No results found for: TRIG No results  found for: CHOLHDL No results found for: LDLDIRECT    Radiology:  Dg Chest Portable 1 View  04/20/2015  CLINICAL DATA:  Bradycardia EXAM: PORTABLE CHEST 1 VIEW COMPARISON:  04/15/2015 chest radiograph. FINDINGS: Pacer pad overlies the left chest. Stable cardiomediastinal silhouette with mild cardiomegaly. No pneumothorax. No pleural effusion. No pulmonary edema. Mild patchy bibasilar lung opacities. IMPRESSION: 1. Stable mild cardiomegaly without pulmonary edema. 2. Mild patchy bibasilar lung opacities, favor atelectasis. Electronically Signed   By: Ilona Sorrel M.D.   On: 04/20/2015 19:55    EKG:  Profound bradycardia with  Low junctional escape at 24bpm  ASSESSMENT/PLAN: 1.  Profound bradycardia with cardiogenic shock - BP now stable on Zoll with external pacing at 60bpm. She is on Coreg as well as timolol eye gtts.   I have discussed code status with the family.  The patient is alert and communicates but does have an element of dementia.  She has a living will.  The family wished to proceed with temporary pacemaker in hopes that this would be a temporary fix until the Beta blockers wore out of her system and hopefully bradycardia would resolve.  She had been able to get around her home and converse with her family.  The children have discussed code status and wish to maker her DNR but want to proceed with pacemaker.  Will proceed with temporary pacemaker tonight by Dr. Gwenlyn Found and then EP will see in the am.  Risks of temp pacer discussed with family including risk of infection, bleeding, ventricular perforation and pericardial effusion with tamponade.  Family agrees to proceed.  They did state that if complications arise during temp pacer insertion and patient cardiac arrests, that no chest compressions, intubation or defibrillation be performed. 2.  Hypothermia ? Etiology - ? Underlying sepsis.  She has had a cough for a few days and chest xray with opacities.  Per CCM 3.  Hyponatremia - her sodium  was 135 a few days ago. 4.  NPH - recently diagnosed with Head CT - no further workup per Neuro. 5.  HTN 6.  Chronic atrial fibrillation on Apixaban  Sueanne Margarita, MD  04/20/2015  9:27 PM

## 2015-04-20 NOTE — ED Notes (Signed)
Pt being paced externally   By ed resident

## 2015-04-20 NOTE — H&P (Signed)
PULMONARY / CRITICAL CARE MEDICINE   Name: AMANEE TO MRN: [id number] DOB: Dec 14, 1920    ADMISSION DATE:  04/20/2015 CONSULTATION DATE:  04/20/15  REFERRING MD :  Reather Converse  CHIEF COMPLAINT:  Feeling ill  HISTORY OF PRESENT ILLNESS:   Pt is a 81yo female who was brought in to the ED by her daughter because she had not been feeling well. At her baseline, she is able to go out to eat with family, participate in church, help unload the dish washer, but she does not tend to her ADLs. She has not been well for several days and was seen in the ED a few days ago. Today, she was noted to be bradycardic with HR in the 20's and hypothermic. She was provided with several doses of atropine and then attached to an external pacemaker. Cardiology was called and she will be taken to the cath lab for a temporary pacemaker. She was placed on a barehugger for her hypothermia.   PAST MEDICAL HISTORY :  She  has a past medical history of Hypertension; Dementia; Arthritis; and A-fib ([place]).  PAST SURGICAL HISTORY: She  has past surgical history that includes Knee arthroscopy and Back surgery.  No Known Allergies  No current facility-administered medications on file prior to encounter.   Current Outpatient Prescriptions on File Prior to Encounter  Medication Sig  . alendronate (FOSAMAX) 70 MG tablet Take 70 mg by mouth every Tuesday. Take with a full glass of water on an empty stomach.  . Ascorbic Acid (VITAMIN C) 1000 MG tablet Take 1,000 mg by mouth 2 (two) times daily.   Marland Kitchen CALCIUM PO Take 1 tablet by mouth 2 (two) times daily.   . carvedilol (COREG) 12.5 MG tablet TAKE 1 TABLET TWICE A DAY (Patient taking differently: TAKE 1/2 TABLET TWICE A DAY)  . CRANBERRY PO Take 300 mg by mouth daily.   Marland Kitchen donepezil (ARICEPT) 10 MG tablet Take 10 mg by mouth at bedtime.  Marland Kitchen ELIQUIS 2.5 MG TABS tablet TAKE 1 TABLET TWICE A DAY  . iron polysaccharides (NIFEREX) 150 MG capsule Take 150 mg by mouth daily.  .  memantine (NAMENDA) 10 MG tablet Take 10 mg by mouth at bedtime.   . mirabegron ER (MYRBETRIQ) 25 MG TB24 tablet Take 25 mg by mouth daily.  . timolol (BETIMOL) 0.5 % ophthalmic solution Place 1 drop into the right eye daily.   . travoprost, benzalkonium, (TRAVATAN) 0.004 % ophthalmic solution Place 1 drop into both eyes daily after supper.   Marland Kitchen amLODipine (NORVASC) 5 MG tablet Take 2 tablets (10 mg total) by mouth at bedtime. (Patient not taking: Reported on 04/20/2015)    FAMILY HISTORY:  Her has no family status information on file.   SOCIAL HISTORY: She  reports that she has never smoked. She has never used smokeless tobacco. She reports that she does not drink alcohol or use illicit drugs.  REVIEW OF SYSTEMS:   Patient is somewhat demented and does not know why she is in the hospital. Her information is unreliable per daughter  SUBJECTIVE: patient states that she is feeling better  VITAL SIGNS: BP 106/64 mmHg  Pulse 58  Resp 24  Ht 5' 3.5" (1.613 m)  Wt 68.947 kg (152 lb)  BMI 26.50 kg/m2  SpO2 99%  HEMODYNAMICS:    VENTILATOR SETTINGS:    INTAKE / OUTPUT:    PHYSICAL EXAMINATION: General:  81 year old caucasian female, who appears younger than state age, is in no acute  distress, and is  alert/awake  Oriented to place but not to the month, year, or situation. They appear well nourished well developed Neuro:  Cranial nerves II-XII grossly intact. PERRL. EOMI. Sensation and strength. no focal deficits. Gait was not assessed due to critical illness.  HEENT: Head, normocephalic, atraumatic. Ears symmetric, permeable. Eyes: no conjunctival icterus, no erythema. Nose: permeable, midline septum, no NGT present. Clear oropharynx, Cardiovascular: S1S2 RRR she is being paced at 58 via external PM no murmurs, rubs, or gallops auscultated. No thrills palpated.  Lungs:  Chest symmetrical with respirations, clear to auscultation bilaterally with no wheezing, crackles, equal expansion.   Abdomen:  Soft, nontender, no guarding, nondistended. Present bowel sounds. Musculoskeletal:  No muscle atrophy noted nor weakness Gait was not assessed due to critical illness. No swelling nor tenderness of the joints.  Skin:  No notable scars, rashes, cruises. No bed sores noted.  Lymphatics: no palpable lymphadenopathy of cervical, supraclvicular nor inguinal areas.    LABS:  CBC  Recent Labs Lab 04/15/15 1254 04/20/15 2022  WBC 6.3 6.7  HGB 14.6 11.9*  HCT 42.8 34.8*  PLT 214 137*   Coag's  Recent Labs Lab 04/15/15 1254  APTT 41*  INR 1.29   BMET  Recent Labs Lab 04/15/15 1254 04/20/15 2022  NA 135 124*  K 4.6 5.2*  CL 95* 92*  CO2 29 24  BUN 29* 46*  CREATININE 1.19* 1.33*  GLUCOSE 88 179*   Electrolytes  Recent Labs Lab 04/15/15 1254 04/20/15 2022  CALCIUM 10.0 9.7   Sepsis Markers  Recent Labs Lab 04/20/15 2022  LATICACIDVEN 2.2*   ABG No results for input(s): PHART, PCO2ART, PO2ART in the last 168 hours. Liver Enzymes  Recent Labs Lab 04/15/15 1254  AST 68*  ALT 80*  ALKPHOS 81  BILITOT 0.6  ALBUMIN 3.9   Cardiac Enzymes No results for input(s): TROPONINI, PROBNP in the last 168 hours. Glucose No results for input(s): GLUCAP in the last 168 hours.  Imaging Dg Chest Portable 1 View  04/20/2015  CLINICAL DATA:  Bradycardia EXAM: PORTABLE CHEST 1 VIEW COMPARISON:  04/15/2015 chest radiograph. FINDINGS: Pacer pad overlies the left chest. Stable cardiomediastinal silhouette with mild cardiomegaly. No pneumothorax. No pleural effusion. No pulmonary edema. Mild patchy bibasilar lung opacities. IMPRESSION: 1. Stable mild cardiomegaly without pulmonary edema. 2. Mild patchy bibasilar lung opacities, favor atelectasis. Electronically Signed   By: Ilona Sorrel M.D.   On: 04/20/2015 19:55     CULTURES: Drawn in the ED,   ANTIBIOTICS: Rocephin azithro  SIGNIFICANT EVENTS: Nothing outside of  H&P  LINES/TUBES: n/a  DISCUSSION: Care discussed with cardiology and family.   ASSESSMENT / PLAN:  PULMONARY A: questionable PNA on CXR. Patient with cough and sputum production (white) per daughter at bedside. P:   Agree with empiric therapy for now as the patient is in shock. Will continue CAP coverage. Supplemental oxygen as needed.   CARDIOVASCULAR A: symptomatic bradycardia s/p temp pacer, unknown etiology, w/cardiogenic shock  Lactic acidosis, mild, likely 2/2 hypoperfusion Her hypothermia could be secondary to poor cardiac function but we will rule out infectious etiologies.   P: pt to go to cath lab for temp pacemaker, f/u cardiology recs, serial lactates   RENAL A:  Hyponatremia. Hypochloremia  hyperkalemia Increased BUN and creatinine from baseline but no AKI P:  Will work up hyponatremia. Will trend BMP    GASTROINTESTINAL A:   No active issues  P:     HEMATOLOGIC A:  Normocytic  anemia in a geriatric patient Thrombocytopenia, mild  P:  Monitor both  INFECTIOUS A:  Possible CAP  P:  S/p azithro x 1. Check UA, f/u BC x 2. Will continue empiric antibiotics and f/u cx data. Rocephin azithro.   ENDOCRINE A:  hyperglycemia P:  SSI initiated. Will check a TSH to r/o hypothyroidism as the etiology of her hypothermia. Will also check cortisol level in the AM   NEUROLOGIC A:  Recent evaluation by neurology for dysarthria and was found to have NPH and was discharged home with PT  P:  Will order CT of the head and possible MRI  RASS goal: 0    FAMILY  - Updates: daughter at bedside. Family had conversation with cardiologist regarding code status. They choose to actively pursue therapy for the bradycardia including a pacemaker but otherwise would like the patient to be a DNR.   - Inter-disciplinary family meet or Palliative Care meeting due by:  day 7   Critical care time: 54 minutes  Randa Lynn, MD [place]  04/20/2015, 9:38 PM

## 2015-04-20 NOTE — ED Notes (Signed)
Bare hugger placed after her temp was registering low

## 2015-04-20 NOTE — ED Notes (Signed)
Right after tihe iv stropine was given the pts heart rate went to 70  But is slowly going  Back down

## 2015-04-21 ENCOUNTER — Encounter (HOSPITAL_COMMUNITY): Payer: Self-pay | Admitting: *Deleted

## 2015-04-21 ENCOUNTER — Inpatient Hospital Stay (HOSPITAL_COMMUNITY): Payer: Medicare Other

## 2015-04-21 DIAGNOSIS — I482 Chronic atrial fibrillation: Secondary | ICD-10-CM

## 2015-04-21 DIAGNOSIS — R001 Bradycardia, unspecified: Principal | ICD-10-CM

## 2015-04-21 LAB — URINALYSIS, ROUTINE W REFLEX MICROSCOPIC
BILIRUBIN URINE: NEGATIVE
Glucose, UA: NEGATIVE mg/dL
KETONES UR: NEGATIVE mg/dL
NITRITE: NEGATIVE
PROTEIN: 100 mg/dL — AB
Specific Gravity, Urine: 1.019 (ref 1.005–1.030)
pH: 5 (ref 5.0–8.0)

## 2015-04-21 LAB — BASIC METABOLIC PANEL
ANION GAP: 9 (ref 5–15)
Anion gap: 7 (ref 5–15)
BUN: 44 mg/dL — ABNORMAL HIGH (ref 6–20)
BUN: 44 mg/dL — ABNORMAL HIGH (ref 6–20)
CALCIUM: 9.8 mg/dL (ref 8.9–10.3)
CHLORIDE: 92 mmol/L — AB (ref 101–111)
CO2: 26 mmol/L (ref 22–32)
CO2: 25 mmol/L (ref 22–32)
CREATININE: 1.25 mg/dL — AB (ref 0.44–1.00)
Calcium: 9.7 mg/dL (ref 8.9–10.3)
Chloride: 92 mmol/L — ABNORMAL LOW (ref 101–111)
Creatinine, Ser: 1.28 mg/dL — ABNORMAL HIGH (ref 0.44–1.00)
GFR calc non Af Amer: 35 mL/min — ABNORMAL LOW (ref >60)
GFR, EST AFRICAN AMERICAN: 41 mL/min — AB (ref >60)
GFR, EST AFRICAN AMERICAN: 42 mL/min — AB (ref >60)
GFR, EST NON AFRICAN AMERICAN: 36 mL/min — AB (ref >60)
Glucose, Bld: 171 mg/dL — ABNORMAL HIGH (ref 65–99)
Glucose, Bld: 129 mg/dL — ABNORMAL HIGH (ref 65–99)
POTASSIUM: 5.6 mmol/L — AB (ref 3.5–5.1)
Potassium: 5.5 mmol/L — ABNORMAL HIGH (ref 3.5–5.1)
SODIUM: 125 mmol/L — AB (ref 135–145)
SODIUM: 126 mmol/L — AB (ref 135–145)

## 2015-04-21 LAB — URINE MICROSCOPIC-ADD ON

## 2015-04-21 LAB — PROCALCITONIN: Procalcitonin: <0.1 ng/mL

## 2015-04-21 LAB — CBC
HCT: 34.5 % — ABNORMAL LOW (ref 36.0–46.0)
HEMOGLOBIN: 11.7 g/dL — AB (ref 12.0–15.0)
MCH: 28.5 pg (ref 26.0–34.0)
MCHC: 33.9 g/dL (ref 30.0–36.0)
MCV: 83.9 fL (ref 78.0–100.0)
PLATELETS: 135 10*3/uL — AB (ref 150–400)
RBC: 4.11 MIL/uL (ref 3.87–5.11)
RDW: 14.7 % (ref 11.5–15.5)
WBC: 4.6 10*3/uL (ref 4.0–10.5)

## 2015-04-21 LAB — PHOSPHORUS
PHOSPHORUS: 5.8 mg/dL — AB (ref 2.5–4.6)
PHOSPHORUS: 5.5 mg/dL — AB (ref 2.5–4.6)

## 2015-04-21 LAB — MAGNESIUM
MAGNESIUM: 1.9 mg/dL (ref 1.7–2.4)
MAGNESIUM: 2 mg/dL (ref 1.7–2.4)
MAGNESIUM: 2 mg/dL (ref 1.7–2.4)

## 2015-04-21 LAB — LACTIC ACID, PLASMA: Lactic Acid, Venous: 1.1 mmol/L (ref 0.5–2.0)

## 2015-04-21 LAB — TSH: TSH: 10.09 u[IU]/mL — AB (ref 0.350–4.500)

## 2015-04-21 LAB — SODIUM, URINE, RANDOM: SODIUM UR: 14 mmol/L

## 2015-04-21 LAB — MRSA PCR SCREENING: MRSA BY PCR: POSITIVE — AB

## 2015-04-21 LAB — CHLORIDE, URINE, RANDOM: CHLORIDE URINE: 19 mmol/L

## 2015-04-21 LAB — CORTISOL-AM, BLOOD: CORTISOL - AM: 26.7 ug/dL — AB (ref 6.7–22.6)

## 2015-04-21 LAB — GLUCOSE, CAPILLARY
Glucose-Capillary: 165 mg/dL — ABNORMAL HIGH (ref 65–99)
Glucose-Capillary: 83 mg/dL (ref 65–99)

## 2015-04-21 MED ORDER — CHLORHEXIDINE GLUCONATE CLOTH 2 % EX PADS
6.0000 | MEDICATED_PAD | Freq: Every day | CUTANEOUS | Status: DC
  Administered 2015-04-21 – 2015-04-24 (×4): 6 via TOPICAL

## 2015-04-21 MED ORDER — DEXTROSE 5 % IV SOLN
1.0000 g | INTRAVENOUS | Status: DC
  Administered 2015-04-21 – 2015-04-23 (×3): 1 g via INTRAVENOUS
  Filled 2015-04-21 (×5): qty 10

## 2015-04-21 MED ORDER — LEVOTHYROXINE SODIUM 100 MCG IV SOLR
25.0000 ug | Freq: Every day | INTRAVENOUS | Status: DC
  Administered 2015-04-21: 25 ug via INTRAVENOUS
  Filled 2015-04-21: qty 5

## 2015-04-21 MED ORDER — MUPIROCIN 2 % EX OINT
1.0000 "application " | TOPICAL_OINTMENT | Freq: Two times a day (BID) | CUTANEOUS | Status: DC
  Administered 2015-04-21 – 2015-04-24 (×7): 1 via NASAL
  Filled 2015-04-21 (×3): qty 22

## 2015-04-21 MED ORDER — SODIUM CHLORIDE 0.9 % IV SOLN
INTRAVENOUS | Status: DC
  Administered 2015-04-21: 50 mL/h via INTRAVENOUS

## 2015-04-21 MED ORDER — CETYLPYRIDINIUM CHLORIDE 0.05 % MT LIQD
7.0000 mL | Freq: Two times a day (BID) | OROMUCOSAL | Status: DC
  Administered 2015-04-21 – 2015-04-24 (×8): 7 mL via OROMUCOSAL

## 2015-04-21 NOTE — Progress Notes (Signed)
Foley catheter placed. Cloudy, yellow urine with sediment noted on return. Urine specimens sent as ordered.

## 2015-04-21 NOTE — Progress Notes (Signed)
Utilization Review Completed.Katherine Miranda T11/20/2016  

## 2015-04-21 NOTE — Progress Notes (Signed)
Patients MRSA PCR is positive. Orders initiated.

## 2015-04-21 NOTE — Progress Notes (Addendum)
Patient incont of urine, saturating right groin TVP, also having  nausea and vomiting with turning. Patient trying to hold emesis in mouth. Possible aspiration during episode of emesis.  Message left for Dr. Madalyn Rob via Elzie Rings,  [place].

## 2015-04-21 NOTE — Progress Notes (Signed)
SLP Cancellation Note  Patient Details Name: RASHONNA STANCILL MRN: [id number] DOB: Aug 05, 1920   Cancelled treatment:        Received order for BSE. Pt currently has temporary pacer in femoral artery and unable to sit above 20 degrees. Will check on next date for appropriateness of assessment.    Houston Siren 04/21/2015, 2:23 PM  [phone]

## 2015-04-21 NOTE — Progress Notes (Signed)
PULMONARY / CRITICAL CARE MEDICINE   Name: Katherine Miranda MRN: [id number] DOB: 12/22/1920    ADMISSION DATE:  04/20/2015  REFERRING MD :  Reather Converse  CHIEF COMPLAINT:  Feeling ill  SUBJECTIVE:  None verbal.  VITAL SIGNS: BP 108/57 mmHg  Pulse 59  Temp(Src) 99.2 F (37.3 C) (Oral)  Resp 17  Ht 5' 3.5" (1.613 m)  Wt 151 lb 14.4 oz (68.9 kg)  BMI 26.48 kg/m2  SpO2 94%  INTAKE / OUTPUT: I/O last 3 completed shifts: In: 182.3 [P.O.:60; I.V.:72.3; IV Piggyback:50] Out: 295 [Urine:45; Emesis/NG output:250]  PHYSICAL EXAMINATION: General: ill appearing Neuro: alert, follows commands, non verbal Cardiac: regular, bradycardic Chest: no wheeze Abd: soft, non-tender Ext: no edema Skin: no rashes  LABS:  CBC  Recent Labs Lab 04/15/15 1254 04/20/15 2022 04/21/15 0344  WBC 6.3 6.7 4.6  HGB 14.6 11.9* 11.7*  HCT 42.8 34.8* 34.5*  PLT 214 137* 135*   Coag's  Recent Labs Lab 04/15/15 1254  APTT 41*  INR 1.29   BMET  Recent Labs Lab 04/20/15 2022 04/20/15 2200 04/21/15 0344  NA 124* 125* 126*  K 5.2* 5.6* 5.5*  CL 92* 92* 92*  CO2 24 26 25   BUN 46* 44* 44*  CREATININE 1.33* 1.28* 1.25*  GLUCOSE 179* 171* 129*   Electrolytes  Recent Labs Lab 04/20/15 2022 04/20/15 2200 04/21/15 0344  CALCIUM 9.7 9.7 9.8  MG  --  1.9  2.0 2.0  PHOS  --  5.8* 5.5*   Sepsis Markers  Recent Labs Lab 04/20/15 2022 04/20/15 2200 04/21/15 0230  LATICACIDVEN 2.2*  --  1.1  PROCALCITON  --  <0.10  <0.10  --    ABG  Recent Labs Lab 04/20/15 2150  PHART 7.467*  PCO2ART 37.6  PO2ART 58.0*   Liver Enzymes  Recent Labs Lab 04/15/15 1254  AST 68*  ALT 80*  ALKPHOS 81  BILITOT 0.6  ALBUMIN 3.9   Glucose  Recent Labs Lab 04/21/15 0035 04/21/15 0845  GLUCAP 165* 83    Imaging Dg Chest Port 1 View  04/21/2015  CLINICAL DATA:  Airway aspiration EXAM: PORTABLE CHEST 1 VIEW COMPARISON:  04/20/2015 FINDINGS: Defibrillator pad obscures the upper  mediastinum Cardiac enlargement without heart failure. Mild bibasilar atelectasis and small effusions. No change from yesterday IMPRESSION: Mild bibasilar atelectasis and small effusions. No change from yesterday. Electronically Signed   By: Franchot Gallo M.D.   On: 04/21/2015 09:42   Dg Chest Portable 1 View  04/20/2015  CLINICAL DATA:  Bradycardia EXAM: PORTABLE CHEST 1 VIEW COMPARISON:  04/15/2015 chest radiograph. FINDINGS: Pacer pad overlies the left chest. Stable cardiomediastinal silhouette with mild cardiomegaly. No pneumothorax. No pleural effusion. No pulmonary edema. Mild patchy bibasilar lung opacities. IMPRESSION: 1. Stable mild cardiomegaly without pulmonary edema. 2. Mild patchy bibasilar lung opacities, favor atelectasis. Electronically Signed   By: Ilona Sorrel M.D.   On: 04/20/2015 19:55     CULTURES: Blood 11/19 >>  ANTIBIOTICS: Rocephin 11/19 >>  Zithromax 11/19 >> 11/20  SIGNIFICANT EVENTS: 11/19 Admit, cardiology consulted  LINES/TUBES: Rt venous sheath 11/19 >>  DISCUSSION: 81 yo female presented with weakness from bradycardia, and hypothermia.  ASSESSMENT / PLAN:  PULMONARY A:  Atelectasis. P:   Bronchial hygiene  CARDIOVASCULAR A:  Symptomatic bradycardia. Lactic acidosis. Hx of A fib, HTN. P:  Monitor hemodynamics Hold outpt eliquis, amlodipine, coreg Hold outpt timolol, travatan Hold outpt myrbetriq  RENAL A:   Hyponatremia >> hypovolemic. Hyperkalemia. CKD stage 3. P:  F/u BMET NS IV fluid  GASTROINTESTINAL A:   Nutrition. Dysphagia. P:   Advance diet as tolerated after speech assessment.  HEMATOLOGIC A:  Thrombocytopenia. P:  F/u CBC Lovenox for DVT prevention  INFECTIOUS A:   UTI. Doubt pneumonia. P:  Day 2 of rocephin D/c zithromax 11/20  ENDOCRINE A:   Hypothyroidism >> TSH 10.09 from 04/21/15. P:   Continue synthroid  NEUROLOGIC A:   Hx of dementia, NPH. P:  Monitor mental status Hold outpt  aricept, namenda  Goals of Care >> DNR.  Chesley Mires, MD [facility] Pulmonary/Critical Care 04/21/2015, 9:56 AM Pager:  [phone] After 3pm call: [phone]

## 2015-04-21 NOTE — Consult Note (Signed)
Primary Care Physician: [name], MD Referring Physician:  Admit Date: 04/20/2015  Reason for consultation:  Katherine Miranda is a 81 y.o. female with a h/o of chronic atrial fibrillation on Apixaban, HTN and dementia who, at her baseline, lives alone and is able to feed herself and do her ADL's with help and ambulate with a walker. She was recently seen in the ER 11/14 after he daughter noted that she was talking slower and had slurred speech with slower ambulation. CT showed hydrocephalus and PT was set up as well as home health and no further testing was recommended and she was sent home. Her daughter states that she had a good day on Thursday but then yesterday she seemed slower again with her walking and more confused. Her daughter noted earlier today that she had an episode where she seemed to "space out" for a few seconds and jerk and then she was fine. This happened several times and she called EMS and on their arrival she was bradycardic at 24 bpm. She was given 0.5mg  Atropine with improvement in her HR to 70 and then went back down in the 20's. She was given Atropine again with HR initially increasing and then dropping back in the 20-30's. She was found to be hypothermic with a temp of 85 degrees F and is on a bear hugger. She was then placed on the Zoll and is currently externally paced with no underlying rhythm at a pacing rate of 30bpm. She had a temporary pacemaker wire placed last night. This morning she has no complaints. She is alert and oriented to self but unable to carry on a conversation.   Past Medical History  Diagnosis Date  . Hypertension   . Dementia   . Arthritis   . A-fib [facility])    Past Surgical History  Procedure Laterality Date  . Knee arthroscopy    . Back surgery      . antiseptic oral rinse  7 mL Mouth Rinse BID  . atropine  0.5 mg Intravenous Once  . azithromycin  500 mg Intravenous Q24H  . cefTRIAXone (ROCEPHIN)  IV  1 g Intravenous Q24H   . Chlorhexidine Gluconate Cloth  6 each Topical Q0600  . enoxaparin (LOVENOX) injection  30 mg Subcutaneous Q24H  . fentaNYL (SUBLIMAZE) injection  25 mcg Intravenous Once  . insulin aspart  1-3 Units Subcutaneous 6 times per day  . levothyroxine  25 mcg Intravenous Daily  . mupirocin ointment  1 application Nasal BID      No Known Allergies  Social History   Social History  . Marital Status: Widowed    Spouse Name: N/A  . Number of Children: N/A  . Years of Education: N/A   Occupational History  . Not on file.   Social History Main Topics  . Smoking status: Never Smoker   . Smokeless tobacco: Never Used  . Alcohol Use: No  . Drug Use: No  . Sexual Activity: No   Other Topics Concern  . Not on file   Social History Narrative    History reviewed. No pertinent family history.  ROS- All systems are reviewed and negative except as per the HPI above  Physical Exam: Telemetry: Filed Vitals:   04/21/15 0500 04/21/15 0600 04/21/15 0700 04/21/15 0800  BP: 121/51 107/49 98/52 111/56  Pulse: 62 58 57 59  Temp:    99.2 F (37.3 C)  TempSrc:    Oral  Resp: 17 18 16 18   Height:  Weight:      SpO2: 95% 94% 95% 91%    GEN- The patient is well appearing, alert and oriented x 3 today.   Head- normocephalic, atraumatic Eyes-  Sclera clear, conjunctiva pink Ears- hearing intact Oropharynx- clear Neck- supple, no JVP Lymph- no cervical lymphadenopathy Lungs- Clear to ausculation bilaterally, normal work of breathing Heart- Regular rate and rhythm, no murmurs, rubs or gallops, PMI not laterally displaced GI- soft, NT, ND, + BS Extremities- no clubbing, cyanosis, or edema MS- no significant deformity or atrophy Skin- no rash or lesion Psych- euthymic mood, full affect Neuro- strength and sensation are intact  EKG-temporary paced at 60.  When turning on the temporary pacemaker she does have an underlying rhythm at 52 occasionally paced.  Labs:   Lab Results   Component Value Date   WBC 4.6 04/21/2015   HGB 11.7* 04/21/2015   HCT 34.5* 04/21/2015   MCV 83.9 04/21/2015   PLT 135* 04/21/2015    Recent Labs Lab 04/15/15 1254  04/21/15 0344  NA 135  < > 126*  K 4.6  < > 5.5*  CL 95*  < > 92*  CO2 29  < > 25  BUN 29*  < > 44*  CREATININE 1.19*  < > 1.25*  CALCIUM 10.0  < > 9.8  PROT 7.3  --   --   BILITOT 0.6  --   --   ALKPHOS 81  --   --   ALT 80*  --   --   AST 68*  --   --   GLUCOSE 88  < > 129*  < > = values in this interval not displayed. Lab Results  Component Value Date   TROPONINI <0.30 06/08/2012   No results found for: CHOL No results found for: HDL No results found for: LDLCALC No results found for: TRIG No results found for: CHOLHDL No results found for: LDLDIRECT    Radiology: FINDINGS: Pacer pad overlies the left chest. Stable cardiomediastinal silhouette with mild cardiomegaly. No pneumothorax. No pleural effusion. No pulmonary edema. Mild patchy bibasilar lung opacities.  IMPRESSION: 1. Stable mild cardiomegaly without pulmonary edema. 2. Mild patchy bibasilar lung opacities, favor atelectasis.  ASSESSMENT AND PLAN:   1. Bradycardia with cardiogenic shock: Currently paced to her pacing wire with a heart rate of 60. Upon turning down the pacing wire she did have an underlying rhythm coming through at approximately 52. There did appear to be sinus beats that had conduction. She was on Coreg 12.5 prior to her presentation and this has been stopped along with her beta-blocking eyedrops. We Elisse Pennick continue to monitor her for improvement in conduction. She liky we Kelsie Kramp need washout for 5 half-lives of the drug. She is DO NOT RESUSCITATE per her family but by the notes she they would like to proceed with pacemaker should she need one. 2. Hypothermia: The patient presented with hypothermia and now has a bear hugger in place. This is possibly due to severe bradycardia versus sepsis. 3. Urosepsis: The patient had a  positive urinalysis. Results of blood cultures are still pending. She is on ceftriaxone and azithromycin for treatment. 4. Chronic atrial fibrillation: Continue with Eliquis    Rachal Dvorsky Meredith Leeds, MD 04/21/2015  8:58 AM

## 2015-04-21 NOTE — Progress Notes (Signed)
Pt pacemaker is failing to sense. Pt heart rate is currently 73 and the pacemaker rate is set at 50. MD notified about failure to sense with impulses happening post QRS complex. New order obtained. Will continue to monitor.

## 2015-04-21 NOTE — Progress Notes (Signed)
[facility] Progress Note Patient Name: Katherine Miranda DOB: 1920-07-24 MRN: [id number]   Date of Service  04/21/2015  HPI/Events of Note  Multiple issues: 1. Vomited - possible aspiration, 2. Patient incontinent of urine and 3. TSH = 10.09. Patient is currently on therapy for CAP.  eICU Interventions  Will order: 1. Portable CXR now. 2. Place Foley catheter.  3. Synthroid - 25 mcg IV Q day.     Intervention Category Intermediate Interventions: Diagnostic test evaluation;Respiratory distress - evaluation and management Minor Interventions: Communication with other healthcare providers and/or family  Lysle Dingwall 04/21/2015, 6:09 AM

## 2015-04-21 NOTE — Progress Notes (Signed)
Patient heart rate sinus in 70's and pacing post QRS. MD notified. New order obtained to decrease pace rate to 50 at this time. Will continue to monitor.

## 2015-04-22 ENCOUNTER — Encounter (HOSPITAL_COMMUNITY): Payer: Self-pay | Admitting: Cardiovascular Disease

## 2015-04-22 ENCOUNTER — Other Ambulatory Visit: Payer: Self-pay | Admitting: Internal Medicine

## 2015-04-22 DIAGNOSIS — E871 Hypo-osmolality and hyponatremia: Secondary | ICD-10-CM

## 2015-04-22 DIAGNOSIS — I480 Paroxysmal atrial fibrillation: Secondary | ICD-10-CM

## 2015-04-22 DIAGNOSIS — E039 Hypothyroidism, unspecified: Secondary | ICD-10-CM

## 2015-04-22 DIAGNOSIS — F0390 Unspecified dementia without behavioral disturbance: Secondary | ICD-10-CM

## 2015-04-22 LAB — BASIC METABOLIC PANEL
ANION GAP: 8 (ref 5–15)
BUN: 18 mg/dL (ref 6–20)
CALCIUM: 9 mg/dL (ref 8.9–10.3)
CO2: 30 mmol/L (ref 22–32)
Chloride: 99 mmol/L — ABNORMAL LOW (ref 101–111)
Creatinine, Ser: 0.98 mg/dL (ref 0.44–1.00)
GFR calc Af Amer: 56 mL/min — ABNORMAL LOW (ref >60)
GFR calc non Af Amer: 49 mL/min — ABNORMAL LOW (ref >60)
GLUCOSE: 85 mg/dL (ref 65–99)
Potassium: 3.7 mmol/L (ref 3.5–5.1)
Sodium: 137 mmol/L (ref 135–145)

## 2015-04-22 LAB — CBC
HCT: 35.4 % — ABNORMAL LOW (ref 36.0–46.0)
Hemoglobin: 12.2 g/dL (ref 12.0–15.0)
MCH: 29.3 pg (ref 26.0–34.0)
MCHC: 34.5 g/dL (ref 30.0–36.0)
MCV: 85.1 fL (ref 78.0–100.0)
PLATELETS: 114 10*3/uL — AB (ref 150–400)
RBC: 4.16 MIL/uL (ref 3.87–5.11)
RDW: 15.4 % (ref 11.5–15.5)
WBC: 6.6 10*3/uL (ref 4.0–10.5)

## 2015-04-22 LAB — CALCIUM / CREATININE RATIO, URINE
CALCIUM UR: 13.4 mg/dL
Calcium/Creat.Ratio: 152 mg/g creat (ref 0–260)
Creatinine, Urine: 87.9 mg/dL

## 2015-04-22 LAB — T4, FREE: Free T4: 1.39 ng/dL — ABNORMAL HIGH (ref 0.61–1.12)

## 2015-04-22 LAB — MAGNESIUM: Magnesium: 1.6 mg/dL — ABNORMAL LOW (ref 1.7–2.4)

## 2015-04-22 MED ORDER — MAGNESIUM SULFATE 2 GM/50ML IV SOLN
2.0000 g | Freq: Once | INTRAVENOUS | Status: AC
  Administered 2015-04-22: 2 g via INTRAVENOUS
  Filled 2015-04-22: qty 50

## 2015-04-22 MED ORDER — AMLODIPINE BESYLATE 10 MG PO TABS
10.0000 mg | ORAL_TABLET | Freq: Every day | ORAL | Status: DC
  Administered 2015-04-22 – 2015-04-23 (×2): 10 mg via ORAL
  Filled 2015-04-22 (×2): qty 1

## 2015-04-22 MED ORDER — MEMANTINE HCL 5 MG PO TABS
10.0000 mg | ORAL_TABLET | Freq: Every day | ORAL | Status: DC
  Administered 2015-04-22 – 2015-04-23 (×2): 10 mg via ORAL
  Filled 2015-04-22 (×2): qty 2
  Filled 2015-04-22: qty 1

## 2015-04-22 MED ORDER — LEVOTHYROXINE SODIUM 25 MCG PO TABS
25.0000 ug | ORAL_TABLET | Freq: Every day | ORAL | Status: DC
  Administered 2015-04-22 – 2015-04-24 (×3): 25 ug via ORAL
  Filled 2015-04-22 (×3): qty 1

## 2015-04-22 MED ORDER — DONEPEZIL HCL 10 MG PO TABS
10.0000 mg | ORAL_TABLET | Freq: Every day | ORAL | Status: DC
  Administered 2015-04-22 – 2015-04-23 (×2): 10 mg via ORAL
  Filled 2015-04-22 (×3): qty 1

## 2015-04-22 NOTE — Progress Notes (Signed)
Patient Name: RUNA PEDRAZA Date of Encounter: 04/22/2015  Primary Cardiologist: Dr. Lovena Le   Principal Problem:   Symptomatic bradycardia Active Problems:   Dementia   Hyponatremia   HTN (hypertension)   Dyslipidemia   Atrial fibrillation ([place])    SUBJECTIVE  Denies any CP or SOB. Pleasantly confused.  CURRENT MEDS . antiseptic oral rinse  7 mL Mouth Rinse BID  . cefTRIAXone (ROCEPHIN)  IV  1 g Intravenous Q24H  . Chlorhexidine Gluconate Cloth  6 each Topical Q0600  . enoxaparin (LOVENOX) injection  30 mg Subcutaneous Q24H  . levothyroxine  25 mcg Intravenous Daily  . magnesium sulfate 1 - 4 g bolus IVPB  2 g Intravenous Once  . mupirocin ointment  1 application Nasal BID    OBJECTIVE  Filed Vitals:   04/22/15 0400 04/22/15 0500 04/22/15 0600 04/22/15 0700  BP: 152/65 155/66 161/65 159/69  Pulse: 60 69 69 57  Temp: 97.9 F (36.6 C)     TempSrc: Oral     Resp: 13 15 16 11   Height:      Weight:  144 lb 1.6 oz (65.363 kg)    SpO2: 98% 100% 100% 98%    Intake/Output Summary (Last 24 hours) at 04/22/15 0745 Last data filed at 04/22/15 0700  Gross per 24 hour  Intake 1072.5 ml  Output   4540 ml  Net -3467.5 ml   Filed Weights   04/20/15 1925 04/21/15 0008 04/22/15 0500  Weight: 152 lb (68.947 kg) 151 lb 14.4 oz (68.9 kg) 144 lb 1.6 oz (65.363 kg)    PHYSICAL EXAM  General: Pleasant, NAD. Neuro: Moves all extremities spontaneously. Confused, says she is at home, does not know what year. Psych: Normal affect. HEENT:  Normal  Neck: Supple without bruits or JVD. Lungs:  Resp regular and unlabored, anterior exam CTA. Heart: RRR no s3, s4, or murmurs. R femoral temp pacer Abdomen: Soft, non-tender, non-distended, BS + x 4.  Extremities: No clubbing, cyanosis or edema. DP/PT/Radials 2+ and equal bilaterally.  Accessory Clinical Findings  CBC  Recent Labs  04/20/15 2022 04/21/15 0344 04/22/15 0410  WBC 6.7 4.6 6.6  NEUTROABS 5.4  --   --   HGB  11.9* 11.7* 12.2  HCT 34.8* 34.5* 35.4*  MCV 85.1 83.9 85.1  PLT 137* 135* [id number]*   Basic Metabolic Panel  Recent Labs  04/20/15 2200 04/21/15 0344 04/22/15 0410  NA 125* 126* 137  K 5.6* 5.5* 3.7  CL 92* 92* 99*  CO2 26 25 30   GLUCOSE 171* 129* 85  BUN 44* 44* 18  CREATININE 1.28* 1.25* 0.98  CALCIUM 9.7 9.8 9.0  MG 1.9  2.0 2.0 1.6*  PHOS 5.8* 5.5*  --    Thyroid Function Tests  Recent Labs  04/21/15 0344  TSH 10.090*    TELE NSR with HR 60-70s    ECG  No new EKG   Radiology/Studies  Dg Chest 2 View  04/15/2015  CLINICAL DATA:  Slurred speech for 4 days.  Cough. EXAM: CHEST  2 VIEW COMPARISON:  10/06/2012 FINDINGS: Heart size is upper normal. There is mild pulmonary vascular congestion. There is small left pleural effusion. There is minimal patchy density within the left lung base raise the question of atelectasis or early infiltrate. IMPRESSION: 1. Pulmonary vascular congestion. 2. Small left effusion and left lower lobe atelectasis or infiltrate. Electronically Signed   By: Nolon Nations M.D.   On: 04/15/2015 13:05   Ct Head Wo Contrast  04/15/2015  CLINICAL DATA:  C/o generalized weakness on Thursday and was taken to PCP. Pt became increasingly weak today and was unable to walk. Pt denies headache or injury Hx: HTN, Dementia EXAM: CT HEAD WITHOUT CONTRAST TECHNIQUE: Contiguous axial images were obtained from the base of the skull through the vertex without intravenous contrast. COMPARISON:  10/06/2012 FINDINGS: There is central and cortical atrophy. Periventricular white matter changes are consistent with small vessel disease. There is no intra or extra-axial fluid collection or mass lesion. The basilar cisterns and ventricles have a normal appearance. There is no CT evidence for acute infarction or hemorrhage. Bone windows show no acute calvarial injury. Small left mastoid effusion noted. There is mild mucoperiosteal thickening of the paranasal sinuses. There  is atherosclerotic calcification of the internal carotid arteries. IMPRESSION: 1. Atrophy and small vessel disease. 2.  No evidence for acute intracranial abnormality. 3. Chronic paranasal sinus disease. 4. Small left mastoid effusion. Electronically Signed   By: Nolon Nations M.D.   On: 04/15/2015 13:41   Dg Chest Port 1 View  04/21/2015  CLINICAL DATA:  Airway aspiration EXAM: PORTABLE CHEST 1 VIEW COMPARISON:  04/20/2015 FINDINGS: Defibrillator pad obscures the upper mediastinum Cardiac enlargement without heart failure. Mild bibasilar atelectasis and small effusions. No change from yesterday IMPRESSION: Mild bibasilar atelectasis and small effusions. No change from yesterday. Electronically Signed   By: Franchot Gallo M.D.   On: 04/21/2015 09:42   Dg Chest Portable 1 View  04/20/2015  CLINICAL DATA:  Bradycardia EXAM: PORTABLE CHEST 1 VIEW COMPARISON:  04/15/2015 chest radiograph. FINDINGS: Pacer pad overlies the left chest. Stable cardiomediastinal silhouette with mild cardiomegaly. No pneumothorax. No pleural effusion. No pulmonary edema. Mild patchy bibasilar lung opacities. IMPRESSION: 1. Stable mild cardiomegaly without pulmonary edema. 2. Mild patchy bibasilar lung opacities, favor atelectasis. Electronically Signed   By: Ilona Sorrel M.D.   On: 04/20/2015 19:55    ASSESSMENT AND PLAN  81 yo female with h/o chronic atrial fibrillation on eliquis, HTN, and dementia presented with severe bradycardia, hypothermia and cardiogenic shock.   1. Bradycardia with cardiogenic shock: s/p temp pacer 11/19  - She is DO NOT RESUSCITATE per her family but by the notes she they would like to proceed with pacemaker should she need one. Replete mag, Mg 1.6 this morning.   - HR improved last night, no significant pacing activity, some sensing issue yesterday, likely can discontinue temp pacer today. Ordered free T4 to check her thyroid as TSH elevated at 10.   - still NPO pending swallow eval once temp  pacer removed.   2. Hypothermia: The patient presented with hypothermia and now has a bear hugger in place. This is possibly due to severe bradycardia versus sepsis.  3. Urosepsis: The patient had a positive urinalysis. Results of blood cultures are still pending. She is on ceftriaxone and azithromycin for treatment.  4. Chronic atrial fibrillation: currently in sinus. On SCD, eliquis on hold. No sign of bleeding. Will need to readdress need for NOAC prior to discharge.   - CHA2DS2-Vasc score 4 (age, female, HTN)  5. Elevated TSH: obtain free T4. Synthroid started yesterday  Signed, Almyra Deforest PA-C Pager: [phone]  EP Attending  Agree with above. We will sign off. Please call for additional questions.   Mikle Bosworth.D.

## 2015-04-22 NOTE — Evaluation (Signed)
Clinical/Bedside Swallow Evaluation Patient Details  Name: Katherine Miranda MRN: [id number] Date of Birth: Mar 15, 1921  Today's Date: 04/22/2015 Time: SLP Start Time (ACUTE ONLY): 4 SLP Stop Time (ACUTE ONLY): 1146 SLP Time Calculation (min) (ACUTE ONLY): 16 min  Past Medical History:  Past Medical History  Diagnosis Date  . Hypertension   . Dementia   . Arthritis   . A-fib [name])    Past Surgical History:  Past Surgical History  Procedure Laterality Date  . Knee arthroscopy    . Back surgery    . Cardiac catheterization N/A 04/20/2015    Procedure: Temporary Pacemaker;  Surgeon: Katherine Harp, MD;  Location: [facility];  Service: Cardiovascular;  Laterality: N/A;   HPI:  81 yo female with PMH: HTN, dementia, A-fib, arthritis brought in to the ED due to not feeling well for several days. Pt seen in the ED 11/14 for questionable CVA with CT showing hydrocephalus and discharged. Per MD note, heart rate fluctuated from 70's to 20's and femoral artery pacer placed. Also found to be hypothermic.CXR mild bibasilar atelectasis and small effusions   Assessment / Plan / Recommendation Clinical Impression  Baseline cough noted during repositioning prior to trials of thin, puree and thin liquids. Pt requires moderate verbal/visual/tactile cues due to confusion, hard of hearing versus comprehension deficits and difficulty with verbal expression. Prolonged oral transit and suspected delayed swallow initiation inconsistently. No overt s/s aspiration but at higher risk. Recommend Dys 2 texture with thin liquids, no straws and pills whole in applesauce. Continued ST.       Aspiration Risk  Moderate aspiration risk (due to cognitive deficits)    Diet Recommendation   Dys 2, thin, no straws, full supervision  Medication Administration: Whole meds with puree    Other  Recommendations Oral Care Recommendations: Oral care BID   Follow up Recommendations  Skilled Nursing facility     Frequency and Duration min 2x/week  2 weeks       Swallow Study   General HPI: 81 yo female with PMH: HTN, dementia, A-fib, arthritis brought in to the ED due to not feeling well for several days. Pt seen in the ED 11/14 for questionable CVA with CT showing hydrocephalus and discharged. Per MD note, heart rate fluctuated from 70's to 20's and femoral artery pacer placed. Also found to be hypothermic.CXR mild bibasilar atelectasis and small effusions Type of Study: Bedside Swallow Evaluation Previous Swallow Assessment:  (none) Diet Prior to this Study: NPO Temperature Spikes Noted: No Respiratory Status: Nasal cannula History of Recent Intubation: No Behavior/Cognition: Alert;Cooperative;Confused;Requires cueing Oral Cavity Assessment: Within Functional Limits Oral Care Completed by SLP: Yes Oral Cavity - Dentition: Adequate natural dentition Self-Feeding Abilities: Needs assist;Needs set up Patient Positioning: Upright in bed Baseline Vocal Quality: Low vocal intensity Volitional Cough: Weak Volitional Swallow: Unable to elicit    Oral/Motor/Sensory Function Overall Oral Motor/Sensory Function: Within functional limits   Ice Chips Ice chips: Impaired Presentation: Spoon Oral Phase Functional Implications: Prolonged oral transit   Thin Liquid Thin Liquid: Impaired Presentation: Cup;Straw Pharyngeal  Phase Impairments: Suspected delayed Swallow    Nectar Thick Nectar Thick Liquid: Not tested   Honey Thick Honey Thick Liquid: Not tested   Puree Puree: Within functional limits   Solid Solid: Impaired Oral Phase Functional Implications: Prolonged oral transit       Katherine Miranda 04/22/2015,3:19 PM .  Katherine Miranda Katherine Miranda.Ed [employer] [phone]

## 2015-04-22 NOTE — Progress Notes (Addendum)
PULMONARY / CRITICAL CARE MEDICINE   Name: Katherine Miranda MRN: [id number] DOB: [birth year]    ADMISSION DATE:  04/20/2015  REFERRING MD :  Reather Converse  CHIEF COMPLAINT:  Feeling ill  SUBJECTIVE:  Denies pain  hungry Breathing ok  VITAL SIGNS: BP 155/54 mmHg  Pulse 67  Temp(Src) 96 F (35.6 C) (Oral)  Resp 14  Ht 5' 3.5" (1.613 m)  Wt 65.363 kg (144 lb 1.6 oz)  BMI 25.12 kg/m2  SpO2 90%  INTAKE / OUTPUT: I/O last 3 completed shifts: In: 1254.8 [P.O.:60; I.V.:1094.8; IV Piggyback:100] Out: [id number] Yumi.Martinis; Emesis/NG output:250]  PHYSICAL EXAMINATION: General: chr ill appearing Neuro: alert, follows commands,oriented to name, place Cardiac: regular, bradycardic Chest: no wheeze Abd: soft, non-tender Ext: no edema Skin: no rashes  LABS:  CBC  Recent Labs Lab 04/20/15 2022 04/21/15 0344 04/22/15 0410  WBC 6.7 4.6 6.6  HGB 11.9* 11.7* 12.2  HCT 34.8* 34.5* 35.4*  PLT 137* 135* 114*   Coag's  Recent Labs Lab 04/15/15 1254  APTT 41*  INR 1.29   BMET  Recent Labs Lab 04/20/15 2200 04/21/15 0344 04/22/15 0410  NA 125* 126* 137  K 5.6* 5.5* 3.7  CL 92* 92* 99*  CO2 26 25 30   BUN 44* 44* 18  CREATININE 1.28* 1.25* 0.98  GLUCOSE 171* 129* 85   Electrolytes  Recent Labs Lab 04/20/15 2200 04/21/15 0344 04/22/15 0410  CALCIUM 9.7 9.8 9.0  MG 1.9  2.0 2.0 1.6*  PHOS 5.8* 5.5*  --    Sepsis Markers  Recent Labs Lab 04/20/15 2022 04/20/15 2200 04/21/15 0230  LATICACIDVEN 2.2*  --  1.1  PROCALCITON  --  <0.10  <0.10  --    ABG  Recent Labs Lab 04/20/15 2150  PHART 7.467*  PCO2ART 37.6  PO2ART 58.0*   Liver Enzymes  Recent Labs Lab 04/15/15 1254  AST 68*  ALT 80*  ALKPHOS 81  BILITOT 0.6  ALBUMIN 3.9   Glucose  Recent Labs Lab 04/21/15 0035 04/21/15 0845  GLUCAP 165* 83    Imaging No results found.   CULTURES: Blood 11/19 >>  ANTIBIOTICS: Rocephin 11/19 >>  Zithromax 11/19 >> 11/20  SIGNIFICANT  EVENTS: 11/19 Admit, cardiology consulted  LINES/TUBES: Rt venous sheath 11/19 >>11/21  DISCUSSION: 81 yo female with dementia & chr A fibn  presented with weakness from bradycardia, and hypothermia - on coreg  ASSESSMENT / PLAN:  PULMONARY A:  Atelectasis. P:   Bronchial hygiene  CARDIOVASCULAR A:  Symptomatic bradycardia s/p temp PM - now dc'd Lactic acidosis. Hx of A fib, HTN. P:  Hold outpt eliquis,  Coreg Resume amlodipine Hold outpt timolol, travatan Hold outpt myrbetriq  RENAL A:   Hyponatremia >> hypovolemic-resolved Hyperkalemia. CKD stage 3. P:   F/u BMET NS IV fluid -can dc once starts PO  GASTROINTESTINAL A:   Nutrition. Dysphagia. P:   Advance diet as tolerated after swallow assessment.  HEMATOLOGIC A:  Thrombocytopenia. P:  F/u CBC Lovenox for DVT prevention  INFECTIOUS A:   UTI. Doubt pneumonia. P:  Rocephin 11/19 >> D/c zithromax 11/20  ENDOCRINE A:   Hypothyroidism >> TSH 10.09 from 04/21/15 - new diagnosis P:   Continue synthroid  NEUROLOGIC A:   Hx of dementia, NPH. P:  Monitor mental status resumeoutpt aricept, namenda  Goals of Care >> DNR.  Transfer to tele & triad 11/22  Rigoberto Noel. MD [id number]  04/22/2015, 10:03 AM

## 2015-04-22 NOTE — Progress Notes (Signed)
Patient ID: TONOA VARS, female   DOB: September 11, 1920, 81 y.o.   MRN: [id number]    Patient Name: Katherine Miranda Date of Encounter: 04/22/2015     Principal Problem:   Symptomatic bradycardia Active Problems:   Dementia   Hyponatremia   HTN (hypertension)   Dyslipidemia   Atrial fibrillation ([place])    SUBJECTIVE  Remains confused. Denies chest pain or sob.   CURRENT MEDS . antiseptic oral rinse  7 mL Mouth Rinse BID  . cefTRIAXone (ROCEPHIN)  IV  1 g Intravenous Q24H  . Chlorhexidine Gluconate Cloth  6 each Topical Q0600  . enoxaparin (LOVENOX) injection  30 mg Subcutaneous Q24H  . levothyroxine  25 mcg Intravenous Daily  . magnesium sulfate 1 - 4 g bolus IVPB  2 g Intravenous Once  . mupirocin ointment  1 application Nasal BID    OBJECTIVE  Filed Vitals:   04/22/15 0500 04/22/15 0600 04/22/15 0700 04/22/15 0800  BP: 155/66 161/65 159/69 164/63  Pulse: 69 69 57 65  Temp:    96 F (35.6 C)  TempSrc:    Oral  Resp: 15 16 11 15   Height:      Weight: 144 lb 1.6 oz (65.363 kg)     SpO2: 100% 100% 98% 97%    Intake/Output Summary (Last 24 hours) at 04/22/15 0827 Last data filed at 04/22/15 0800  Gross per 24 hour  Intake 1112.5 ml  Output   4835 ml  Net -3722.5 ml   Filed Weights   04/20/15 1925 04/21/15 0008 04/22/15 0500  Weight: 152 lb (68.947 kg) 151 lb 14.4 oz (68.9 kg) 144 lb 1.6 oz (65.363 kg)    PHYSICAL EXAM  General: Pleasant, elderly woman, NAD. Neuro: Alert and oriented to name only. Moves all extremities spontaneously. Psych: blunted affect. HEENT:  Normal  Neck: Supple without bruits or JVD. Lungs:  Resp regular and unlabored, CTA. Heart: RRR no s3, s4, or murmurs. Abdomen: Soft, non-tender, non-distended, BS + x 4.  Extremities: No clubbing, cyanosis or edema. DP/PT/Radials 2+ and equal bilaterally. Indwelling right temporary pacing wire  Accessory Clinical Findings  CBC  Recent Labs  04/20/15 2022 04/21/15 0344 04/22/15 0410    WBC 6.7 4.6 6.6  NEUTROABS 5.4  --   --   HGB 11.9* 11.7* 12.2  HCT 34.8* 34.5* 35.4*  MCV 85.1 83.9 85.1  PLT 137* 135* [id number]*   Basic Metabolic Panel  Recent Labs  04/20/15 2200 04/21/15 0344 04/22/15 0410  NA 125* 126* 137  K 5.6* 5.5* 3.7  CL 92* 92* 99*  CO2 26 25 30   GLUCOSE 171* 129* 85  BUN 44* 44* 18  CREATININE 1.28* 1.25* 0.98  CALCIUM 9.7 9.8 9.0  MG 1.9  2.0 2.0 1.6*  PHOS 5.8* 5.5*  --    Liver Function Tests No results for input(s): AST, ALT, ALKPHOS, BILITOT, PROT, ALBUMIN in the last 72 hours. No results for input(s): LIPASE, AMYLASE in the last 72 hours. Cardiac Enzymes No results for input(s): CKTOTAL, CKMB, CKMBINDEX, TROPONINI in the last 72 hours. BNP Invalid input(s): POCBNP D-Dimer No results for input(s): DDIMER in the last 72 hours. Hemoglobin A1C No results for input(s): HGBA1C in the last 72 hours. Fasting Lipid Panel No results for input(s): CHOL, HDL, LDLCALC, TRIG, CHOLHDL, LDLDIRECT in the last 72 hours. Thyroid Function Tests  Recent Labs  04/21/15 0344  TSH 10.090*    TELE  Sinus bradycardia  Radiology/Studies  Dg Chest 2 View  04/15/2015  CLINICAL DATA:  Slurred speech for 4 days.  Cough. EXAM: CHEST  2 VIEW COMPARISON:  10/06/2012 FINDINGS: Heart size is upper normal. There is mild pulmonary vascular congestion. There is small left pleural effusion. There is minimal patchy density within the left lung base raise the question of atelectasis or early infiltrate. IMPRESSION: 1. Pulmonary vascular congestion. 2. Small left effusion and left lower lobe atelectasis or infiltrate. Electronically Signed   By: Nolon Nations M.D.   On: 04/15/2015 13:05   Ct Head Wo Contrast  04/15/2015  CLINICAL DATA:  C/o generalized weakness on Thursday and was taken to PCP. Pt became increasingly weak today and was unable to walk. Pt denies headache or injury Hx: HTN, Dementia EXAM: CT HEAD WITHOUT CONTRAST TECHNIQUE: Contiguous axial images  were obtained from the base of the skull through the vertex without intravenous contrast. COMPARISON:  10/06/2012 FINDINGS: There is central and cortical atrophy. Periventricular white matter changes are consistent with small vessel disease. There is no intra or extra-axial fluid collection or mass lesion. The basilar cisterns and ventricles have a normal appearance. There is no CT evidence for acute infarction or hemorrhage. Bone windows show no acute calvarial injury. Small left mastoid effusion noted. There is mild mucoperiosteal thickening of the paranasal sinuses. There is atherosclerotic calcification of the internal carotid arteries. IMPRESSION: 1. Atrophy and small vessel disease. 2.  No evidence for acute intracranial abnormality. 3. Chronic paranasal sinus disease. 4. Small left mastoid effusion. Electronically Signed   By: Nolon Nations M.D.   On: 04/15/2015 13:41   Dg Chest Port 1 View  04/21/2015  CLINICAL DATA:  Airway aspiration EXAM: PORTABLE CHEST 1 VIEW COMPARISON:  04/20/2015 FINDINGS: Defibrillator pad obscures the upper mediastinum Cardiac enlargement without heart failure. Mild bibasilar atelectasis and small effusions. No change from yesterday IMPRESSION: Mild bibasilar atelectasis and small effusions. No change from yesterday. Electronically Signed   By: Franchot Gallo M.D.   On: 04/21/2015 09:42   Dg Chest Portable 1 View  04/20/2015  CLINICAL DATA:  Bradycardia EXAM: PORTABLE CHEST 1 VIEW COMPARISON:  04/15/2015 chest radiograph. FINDINGS: Pacer pad overlies the left chest. Stable cardiomediastinal silhouette with mild cardiomegaly. No pneumothorax. No pleural effusion. No pulmonary edema. Mild patchy bibasilar lung opacities. IMPRESSION: 1. Stable mild cardiomegaly without pulmonary edema. 2. Mild patchy bibasilar lung opacities, favor atelectasis. Electronically Signed   By: Ilona Sorrel M.D.   On: 04/20/2015 19:55    ASSESSMENT AND PLAN  1. Sinus node dysfunction, due in  part to coreg, s/p temp. PM., now much improved - will remove PM 2. Hypomagnesemia - s/p repletion 3. Hyperkalemia - resolved, likely due to reduced cardiac output 4. Demential - she may need SNF, will ask social work to give [place] some input. 5. Disp. - transfer to tele today.  Gregg Taylor,M.D.  04/22/2015 8:27 AM

## 2015-04-23 ENCOUNTER — Inpatient Hospital Stay (HOSPITAL_COMMUNITY): Payer: Medicare Other

## 2015-04-23 LAB — BASIC METABOLIC PANEL
Anion gap: 9 (ref 5–15)
BUN: 21 mg/dL — AB (ref 6–20)
CO2: 29 mmol/L (ref 22–32)
Calcium: 8.9 mg/dL (ref 8.9–10.3)
Chloride: 94 mmol/L — ABNORMAL LOW (ref 101–111)
Creatinine, Ser: 1.07 mg/dL — ABNORMAL HIGH (ref 0.44–1.00)
GFR calc Af Amer: 51 mL/min — ABNORMAL LOW (ref >60)
GFR, EST NON AFRICAN AMERICAN: 44 mL/min — AB (ref >60)
GLUCOSE: 106 mg/dL — AB (ref 65–99)
Potassium: 4 mmol/L (ref 3.5–5.1)
Sodium: 132 mmol/L — ABNORMAL LOW (ref 135–145)

## 2015-04-23 LAB — URIC ACID: Uric Acid, Serum: 6.4 mg/dL (ref 2.3–6.6)

## 2015-04-23 LAB — OSMOLALITY: Osmolality: 283 mOsm/kg (ref 275–295)

## 2015-04-23 MED ORDER — SODIUM CHLORIDE 0.9 % IV SOLN
INTRAVENOUS | Status: AC
  Administered 2015-04-23: 1000 mL via INTRAVENOUS
  Administered 2015-04-24: 05:00:00 via INTRAVENOUS

## 2015-04-23 MED ORDER — LORAZEPAM 2 MG/ML IJ SOLN
INTRAMUSCULAR | Status: AC
Start: 2015-04-23 — End: 2015-04-23
  Administered 2015-04-23: 1 mg via INTRAVENOUS
  Filled 2015-04-23: qty 1

## 2015-04-23 MED ORDER — APIXABAN 2.5 MG PO TABS
2.5000 mg | ORAL_TABLET | Freq: Two times a day (BID) | ORAL | Status: DC
  Administered 2015-04-23 – 2015-04-24 (×3): 2.5 mg via ORAL
  Filled 2015-04-23 (×3): qty 1

## 2015-04-23 MED ORDER — ASPIRIN 81 MG PO CHEW: 81.0000 mg | CHEWABLE_TABLET | Freq: Every day | ORAL | Status: DC

## 2015-04-23 MED ORDER — RESOURCE THICKENUP CLEAR PO POWD
ORAL | Status: DC | PRN
  Filled 2015-04-23: qty 125

## 2015-04-23 MED ORDER — LORAZEPAM 2 MG/ML IJ SOLN
1.0000 mg | Freq: Once | INTRAMUSCULAR | Status: AC
  Administered 2015-04-23: 1 mg via INTRAVENOUS

## 2015-04-23 MED FILL — Medication: Qty: 1 | Status: AC

## 2015-04-23 NOTE — Evaluation (Signed)
Physical Therapy Evaluation Patient Details Name: Katherine Miranda MRN: [id number] DOB: 06/11/1920 Today's Date: 04/23/2015   History of Present Illness  pt presents with Bradycardia and Hypothermia.  pt with recent admit for Bradycardia and had temporary pacer used and was found to have questionable hydrocephalus.  pt with hx of Dementia, HTN, and A-fib.    Clinical Impression  Pt very agreeable to mobility with PT, but is requiring 2 person A for mobility and safety.  Pt had been soiled of bowel and bladder on arrival and was unable to participate in peri hygiene, but did A with rolling in bed to allow PT to perform hygiene.  Feel pt would benefit from SNF level of care at D/C to maximize independence and decrease burden of care.  Will continue to follow.      Follow Up Recommendations SNF    Equipment Recommendations  None recommended by PT    Recommendations for Other Services       Precautions / Restrictions Precautions Precautions: Fall Restrictions Weight Bearing Restrictions: No      Mobility  Bed Mobility Overal bed mobility: Needs Assistance;+2 for physical assistance Bed Mobility: Supine to Sit;Sit to Supine;Rolling Rolling: Mod assist   Supine to sit: Mod assist;+2 for physical assistance;HOB elevated Sit to supine: Mod assist;+2 for physical assistance   General bed mobility comments: pt does participate in bed mobility, but needs A for trunk and Bil LEs.    Transfers Overall transfer level: Needs assistance Equipment used: 2 person hand held assist Transfers: Sit to/from Stand Sit to Stand: Max assist;+2 physical assistance         General transfer comment: cues for encouragement and technique.  pt remains flexed posture and with posterior lean.  With facilitation pt is able to stand more erect, but unable to maintain.    Ambulation/Gait                Stairs            Wheelchair Mobility    Modified Rankin (Stroke Patients  Only) Modified Rankin (Stroke Patients Only) Pre-Morbid Rankin Score: Moderately severe disability Modified Rankin: Severe disability     Balance Overall balance assessment: Needs assistance Sitting-balance support: Bilateral upper extremity supported;Feet supported Sitting balance-Leahy Scale: Poor Sitting balance - Comments: pt leans to L side in sitting.  when cued to attend to balance, pt does attempt to correct.   Postural control: Posterior lean;Left lateral lean Standing balance support: During functional activity Standing balance-Leahy Scale: Poor                               Pertinent Vitals/Pain Pain Assessment: Faces Faces Pain Scale: Hurts even more Pain Location: L side, but unable to be more specific.   Pain Descriptors / Indicators: Grimacing;Guarding Pain Intervention(s): Monitored during session;Repositioned;Patient requesting pain meds-RN notified    Home Living Family/patient expects to be discharged to:: Skilled nursing facility                      Prior Function Level of Independence: Needs assistance         Comments: No family present and pt with Dementia.  Per chart pt has 24hr care at home, but daughter in agreement for further rehab.       Hand Dominance        Extremity/Trunk Assessment   Upper Extremity Assessment: Generalized weakness  Lower Extremity Assessment: Generalized weakness;Difficult to assess due to impaired cognition      Cervical / Trunk Assessment: Kyphotic  Communication   Communication: No difficulties  Cognition Arousal/Alertness: Awake/alert Behavior During Therapy: Flat affect Overall Cognitive Status: No family/caregiver present to determine baseline cognitive functioning                      General Comments      Exercises        Assessment/Plan    PT Assessment Patient needs continued PT services  PT Diagnosis Difficulty walking;Generalized weakness   PT  Problem List Decreased strength;Decreased activity tolerance;Decreased balance;Decreased mobility;Decreased coordination;Decreased cognition;Decreased knowledge of use of DME;Pain  PT Treatment Interventions DME instruction;Gait training;Functional mobility training;Therapeutic activities;Therapeutic exercise;Balance training;Cognitive remediation;Patient/family education   PT Goals (Current goals can be found in the Care Plan section) Acute Rehab PT Goals Patient Stated Goal: pt unable to state. PT Goal Formulation: Patient unable to participate in goal setting Time For Goal Achievement: 05/07/15 Potential to Achieve Goals: Fair    Frequency Min 2X/week   Barriers to discharge        Co-evaluation               End of Session Equipment Utilized During Treatment: Gait belt;Oxygen Activity Tolerance: Patient limited by fatigue Patient left: in bed;with call bell/phone within reach;with bed alarm set Nurse Communication: Mobility status         Time: [id number] PT Time Calculation (min) (ACUTE ONLY): 34 min   Charges:   PT Evaluation $Initial PT Evaluation Tier I: 1 Procedure PT Treatments $Therapeutic Activity: 8-22 mins   PT G CodesCatarina Miranda, [place] 04/23/2015, 2:52 PM

## 2015-04-23 NOTE — Clinical Social Work Placement (Signed)
   CLINICAL SOCIAL WORK PLACEMENT  NOTE  Date:  04/23/2015  Patient Details  Name: Katherine Miranda MRN: [id number] Date of Birth: 23-Jul-1920  Clinical Social Work is seeking post-discharge placement for this patient at the [place] level of care (*CSW will initial, date and re-position this form in  chart as items are completed):  Yes   Patient/family provided with [facility] Work Department's list of facilities offering this level of care within the geographic area requested by the patient (or if unable, by the patient's family).  Yes   Patient/family informed of their freedom to choose among providers that offer the needed level of care, that participate in Medicare, Medicaid or managed care program needed by the patient, have an available bed and are willing to accept the patient.  Yes   Patient/family informed of [facility]'s ownership interest in [facility] and [facility], as well as of the fact that they are under no obligation to receive care at these facilities.  PASRR submitted to EDS on       PASRR number received on       Existing PASRR number confirmed on 04/23/15     FL2 transmitted to all facilities in geographic area requested by pt/family on 04/23/15     FL2 transmitted to all facilities within larger geographic area on       Patient informed that his/her managed care company has contracts with or will negotiate with certain facilities, including the following:            Patient/family informed of bed offers received.  Patient chooses bed at       Physician recommends and patient chooses bed at      Patient to be transferred to   on  .  Patient to be transferred to facility by       Patient family notified on   of transfer.  Name of family member notified:        PHYSICIAN       Additional Comment:    _______________________________________________ Liz Beach MSW, [place], [place], [id number]

## 2015-04-23 NOTE — Clinical Social Work Note (Signed)
Clinical Social Work Assessment  Patient Details  Name: Katherine Miranda MRN: [id number] Date of Birth: 10/30/1920  Date of referral:  04/23/15               Reason for consult:  Discharge Planning, Facility Placement                Permission sought to share information with:  Facility Sport and exercise psychologist, Family Supports Permission granted to share information::  Yes, Verbal Permission Granted  Name::     Sports administrator::  SNFs  Relationship::     Contact Information:     Housing/Transportation Living arrangements for the past 2 months:  [place] of Information:  Adult Children Patient Interpreter Needed:  None Criminal Activity/Legal Involvement Pertinent to Current Situation/Hospitalization:  No - Comment as needed Significant Relationships:  Adult Children Lives with:  Self, Other (Comment) (Has 24/7 caregivers in the home, per daughter) Do you feel safe going back to the place where you live?  Yes Need for family participation in patient care:  Yes (Comment)  Care giving concerns:  Patient's daughter Katherine Miranda agrees that patient will need to go to SNF at discharge for rehab if this is recommended.   Social Worker assessment / plan:  CSW spoke with patient's daughter by phone to complete assessment as patient is confused. Daughter Katherine Miranda agrees with SNF placement if this need is indicated. CSW explained SNF search/placement process and answered Nancy's questions. Per Katherine Miranda, patient lives at home with 24/7 paid caregivers. CSW will follow up with bed offers.  Employment status:  Retired Forensic scientist:  Medicare PT Recommendations:  [place] / Referral to community resources:  [place]  Patient/Family's Response to care:  Patient's daughter expressed frustration with ED experience during previous trip to the hospital. She is much happier with the experience during this hospitalization.   Patient/Family's  Understanding of and Emotional Response to Diagnosis, Current Treatment, and Prognosis:  Patient's family appears to have a good understanding of reason for admission and diagnosis. Family understands the patient's post DC needs.   Emotional Assessment Appearance:  Appears stated age Attitude/Demeanor/Rapport:  Unable to Assess Affect (typically observed):  Unable to Assess Orientation:  Oriented to Self Alcohol / Substance use:  Never Used Psych involvement (Current and /or in the community):  No (Comment)  Discharge Needs  Concerns to be addressed:  Discharge Planning Concerns Readmission within the last 30 days:  No Current discharge risk:  Cognitively Impaired, Physical Impairment Barriers to Discharge:  Continued Medical Work up   [employer] MSW, [place], [place], [id number]

## 2015-04-23 NOTE — Progress Notes (Signed)
Rhythm stable. EP will sign off.  Please call with questions.  Chanetta Marshall, NP 04/23/2015 10:14 AM  Mikle Bosworth.D.

## 2015-04-23 NOTE — Progress Notes (Signed)
MBSS complete. Full report located under chart review in imaging section. Gioia Ranes, MA CCC-SLP [phone]  

## 2015-04-23 NOTE — Progress Notes (Signed)
Speech Language Pathology Treatment: Dysphagia  Patient Details Name: Katherine Miranda MRN: [id number] DOB: January 07, 1921 Today's Date: 04/23/2015 Time: 0815-0825 SLP Time Calculation (min) (ACUTE ONLY): 10 min  Assessment / Plan / Recommendation Clinical Impression  Pt demonstrated a wet vocal quality prior to po's, suspicious of silent aspiration. Pt cleared throat with modeling and max cues. Pt had a delayed weak cough with cup sips of thin liquid and multiple swallows per single sip and no overt s/s with solids. Additionally, pt has poor posture, left leaning, SLP repositioned in bed. SLP recommends MBS to objectively evaluate swallow function, remain NPO with pills whole in puree.    HPI HPI: 81 yo female with PMH: HTN, dementia, A-fib, arthritis brought in to the ED due to not feeling well for several days. Pt seen in the ED 11/14 for questionable CVA with CT showing hydrocephalus and discharged. Per MD note, heart rate fluctuated from 70's to 20's and femoral artery pacer placed. Also found to be hypothermic.CXR mild bibasilar atelectasis and small effusions      SLP Plan  MBS;New goals to be determined pending instrumental study     Recommendations  Diet recommendations: NPO Medication Administration: Whole meds with puree Supervision: Patient able to self feed;Full supervision/cueing for compensatory strategies Postural Changes and/or Swallow Maneuvers: Seated upright 90 degrees              Oral Care Recommendations: Oral care BID Follow up Recommendations: Skilled Nursing facility Plan: MBS;New goals to be determined pending instrumental study  Lanier Ensign, Student-SLP  Lanier Ensign 04/23/2015, 8:37 AM

## 2015-04-23 NOTE — Progress Notes (Signed)
ANTICOAGULATION CONSULT NOTE - Initial Consult  Pharmacy Consult for Eliquis Indication: atrial fibrillation  No Known Allergies  Patient Measurements: Height: 5' 3.5" (161.3 cm) Weight: 146 lb 4.8 oz (66.361 kg) IBW/kg (Calculated) : 53.55  Vital Signs: Temp: 98.4 F (36.9 C) (11/22 0500) Temp Source: Oral (11/22 0500) BP: 143/66 mmHg (11/22 0500) Pulse Rate: 66 (11/22 0500)  Labs:  Recent Labs  04/20/15 2022  04/21/15 0344 04/22/15 0410 04/23/15 0418  HGB 11.9*  --  11.7* 12.2  --   HCT 34.8*  --  34.5* 35.4*  --   PLT 137*  --  135* 114*  --   CREATININE 1.33*  < > 1.25* 0.98 1.07*  < > = values in this interval not displayed.  Estimated Creatinine Clearance: 31.1 mL/min (by C-G formula based on Cr of 1.07).   Medical History: Past Medical History  Diagnosis Date  . Hypertension   . Dementia   . Arthritis   . A-fib [facility])    Assessment:   81 yr old female admitted 11/19 with bradycardia. Temporary pacer placed on 11/19, removed 11/21.  Was on Eliquis prior to admission for afib, to resume today.  Has been on Lovenox 30 mg sq q24hrs.  Last dose ~8am today.  Thrombocytopenia, no bleeding noted.   Technically qualifies for full-dose Eliquis, with >80 yrs but weight > 60 kg and Scr < 1.5, but has been on reduced-dose Eliquis since 2014.  Goal of Therapy:  appropriate Eliquis dose for indication Monitor platelets by anticoagulation protocol: Yes   Plan:   Resume Eliquis 2.5 mg BID, as prior to admission.  Follow up CBC in am.  Monitor for any bleeding.  Arty Baumgartner, [place] Pager: [phone] 04/23/2015,10:21 AM

## 2015-04-23 NOTE — Progress Notes (Addendum)
Patient Demographics:    Katherine Miranda, is a 81 y.o. female, DOB - 02/23/1921, [id number]  Admit date - 04/20/2015   Admitting Physician Chesley Mires, MD  Outpatient Primary MD for the patient is SHAW,[name], MD  LOS - 3  Summary  This is a 81 year old Caucasian female with history of advanced dementia who lives at home with 24-hour supervision, chronic atrial fibrillation on Eliquis under the care of Dr. Loistine Simas, arthritis, essential hypertension, who was brought to the ER with hypothermia and severe bradycardia along with elevated TSH around 10 few days ago. She was initially admitted by pulmonary critical care, she was on beta blocker which was stopped, she was seen by cardiology required temporary venous pacemaker placement for a few days. Pacemaker was discontinued and her heart rate stabilized and she was transferred under my care on 04/23/2015.    Chief Complaint  Patient presents with  . Bradycardia        Subjective:    Ilsa Iha today has, No headache, No chest pain, No abdominal pain - No Nausea, No new weakness tingling or numbness, No Cough - SOB. Unreliable historian   Assessment  & Plan :     1. Bradycardia and hypothermia. Likely due to combination of Coreg, beta blocker eyedrops and hypothyroidism. Much improved, out of critical care, temporary venous pacemaker discontinued yesterday, on Synthroid, temperature heart rate better. Continue to monitor. She is DO NOT RESUSCITATE confirmed with daughter.   2. Chronic atrial fibrillation. No rate controlling agents due to above, seen by cardiology this admission, restart home dose Eliquis.   3. Hypothyroidism. On Synthroid continue   4. Severe dehydration with hyponatremia and acute renal failure. Gently hydrate and  monitor. Obtain urine electrolytes.   5. Advanced dementia. Some right-sided neglect and leaning to the left side, head CT was unremarkable upon admission, will check MRI. This will be for prognostic purposes only. We'll initiate PT and speech eval.   6. Dysphagia. Rule out stroke, speech following. Due for modified barium swallow today.    7. Essential hypertension. On Norvasc continued.     Code Status : DO NOT RESUSCITATE  Family Communication  : Daughter over the phone  Disposition Plan  : Start PT and decide, currently lives alone at home with 24-hour caregiver and supervision.  Consults  :  Neurology, critical care  Procedures  :   CT head nonacute  Temporary venous pacemaker placement by cardiology on 1119 then removed on 21st  MRI brain ordered 04/23/2015   DVT Prophylaxis  :  Eliquis  Lab Results  Component Value Date   PLT 114* 04/22/2015    Inpatient Medications  Scheduled Meds: . amLODipine  10 mg Oral QHS  . antiseptic oral rinse  7 mL Mouth Rinse BID  . aspirin  81 mg Oral Daily  . cefTRIAXone (ROCEPHIN)  IV  1 g Intravenous Q24H  . Chlorhexidine Gluconate Cloth  6 each Topical Q0600  . donepezil  10 mg Oral QHS  . enoxaparin (LOVENOX) injection  30 mg Subcutaneous Q24H  . levothyroxine  25 mcg Oral QAC breakfast  . LORazepam  1 mg Intravenous Once  . memantine  10 mg Oral QHS  . mupirocin ointment  1 application Nasal BID  Continuous Infusions: . sodium chloride 1,000 mL (04/23/15 0804)   PRN Meds:.acetaminophen, ondansetron (ZOFRAN) IV  Antibiotics  :    Anti-infectives    Start     Dose/Rate Route Frequency Ordered Stop   04/21/15 2300  cefTRIAXone (ROCEPHIN) 1 g in dextrose 5 % 50 mL IVPB     1 g 100 mL/hr over 30 Minutes Intravenous Every 24 hours 04/21/15 1006     04/21/15 1400  azithromycin (ZITHROMAX) 500 mg in dextrose 5 % 250 mL IVPB  Status:  Discontinued     500 mg 250 mL/hr over 60 Minutes Intravenous Every 24 hours  04/20/15 2253 04/21/15 1006   04/20/15 2300  cefTRIAXone (ROCEPHIN) 1 g in dextrose 5 % 50 mL IVPB  Status:  Discontinued     1 g 100 mL/hr over 30 Minutes Intravenous Every 24 hours 04/20/15 2253 04/21/15 1006   04/20/15 2130  azithromycin (ZITHROMAX) 500 mg in dextrose 5 % 250 mL IVPB     500 mg 250 mL/hr over 60 Minutes Intravenous  Once 04/20/15 2126 04/20/15 2313        Objective:   Filed Vitals:   04/22/15 2235 04/22/15 2303 04/23/15 0103 04/23/15 0500  BP: 139/63 139/63 139/56 143/66  Pulse:  69 81 66  Temp:  97.8 F (36.6 C) 98.5 F (36.9 C) 98.4 F (36.9 C)  TempSrc:  Oral Oral Oral  Resp:  16 17 14   Height:      Weight:    66.361 kg (146 lb 4.8 oz)  SpO2:  98% 98% 97%    Wt Readings from Last 3 Encounters:  04/23/15 66.361 kg (146 lb 4.8 oz)  03/29/14 69.174 kg (152 lb 8 oz)  10/03/13 70.534 kg (155 lb 8 oz)     Intake/Output Summary (Last 24 hours) at 04/23/15 0933 Last data filed at 04/22/15 1500  Gross per 24 hour  Intake 338.33 ml  Output   1250 ml  Net -911.67 ml     Physical Exam  Awake ,pleasantly confused, No new F.N deficits, but leans to the right and neglects right sideNormal affect [place].AT,PERRAL Supple Neck,No JVD, No cervical lymphadenopathy appriciated.  Symmetrical Chest wall movement, Good air movement bilaterally, CTAB RRR,No Gallops,Rubs or new Murmurs, No Parasternal Heave +ve B.Sounds, Abd Soft, No tenderness, No organomegaly appriciated, No rebound - guarding or rigidity. No Cyanosis, Clubbing or edema, No new Rash or bruise       Data Review:   Micro Results Recent Results (from the past 240 hour(s))  Blood culture (routine x 2)     Status: None (Preliminary result)   Collection Time: 04/20/15  8:13 PM  Result Value Ref Range Status   Specimen Description BLOOD RIGHT HAND  Final   Special Requests BOTTLES DRAWN AEROBIC ONLY 5CC  Final   Culture NO GROWTH 2 DAYS  Final   Report Status PENDING  Incomplete  Blood culture  (routine x 2)     Status: None (Preliminary result)   Collection Time: 04/20/15  8:22 PM  Result Value Ref Range Status   Specimen Description BLOOD RIGHT WRIST  Final   Special Requests BOTTLES DRAWN AEROBIC ONLY 5CC  Final   Culture NO GROWTH 2 DAYS  Final   Report Status PENDING  Incomplete  MRSA PCR Screening     Status: Abnormal   Collection Time: 04/21/15 12:25 AM  Result Value Ref Range Status   MRSA by PCR POSITIVE (A) NEGATIVE Final    Comment:  The GeneXpert MRSA Assay (FDA approved for NASAL specimens only), is one component of a comprehensive MRSA colonization surveillance program. It is not intended to diagnose MRSA infection nor to guide or monitor treatment for MRSA infections. RESULT CALLED TO, READ BACK BY AND VERIFIED WITH: CORO,J RN [phone] 04/21/15 MITCHELL,L     Radiology Reports    Dg Chest Port 1 View  04/21/2015  CLINICAL DATA:  Airway aspiration EXAM: PORTABLE CHEST 1 VIEW COMPARISON:  04/20/2015 FINDINGS: Defibrillator pad obscures the upper mediastinum Cardiac enlargement without heart failure. Mild bibasilar atelectasis and small effusions. No change from yesterday IMPRESSION: Mild bibasilar atelectasis and small effusions. No change from yesterday. Electronically Signed   By: Franchot Gallo M.D.   On: 04/21/2015 09:42      CBC  Recent Labs Lab 04/20/15 2022 04/21/15 0344 04/22/15 0410  WBC 6.7 4.6 6.6  HGB 11.9* 11.7* 12.2  HCT 34.8* 34.5* 35.4*  PLT 137* 135* 114*  MCV 85.1 83.9 85.1  MCH 29.1 28.5 29.3  MCHC 34.2 33.9 34.5  RDW 14.6 14.7 15.4  LYMPHSABS 0.8  --   --   MONOABS 0.5  --   --   EOSABS 0.1  --   --   BASOSABS 0.0  --   --     Chemistries   Recent Labs Lab 04/20/15 2022 04/20/15 2200 04/21/15 0344 04/22/15 0410 04/23/15 0418  NA 124* 125* 126* 137 132*  K 5.2* 5.6* 5.5* 3.7 4.0  CL 92* 92* 92* 99* 94*  CO2 24 26 25 30 29   GLUCOSE 179* 171* 129* 85 106*  BUN 46* 44* 44* 18 21*  CREATININE 1.33* 1.28*  1.25* 0.98 1.07*  CALCIUM 9.7 9.7 9.8 9.0 8.9  MG  --  1.9  2.0 2.0 1.6*  --    ------------------------------------------------------------------------------------------------------------------ estimated creatinine clearance is 31.1 mL/min (by C-G formula based on Cr of 1.07). ------------------------------------------------------------------------------------------------------------------ No results for input(s): HGBA1C in the last 72 hours. ------------------------------------------------------------------------------------------------------------------ No results for input(s): CHOL, HDL, LDLCALC, TRIG, CHOLHDL, LDLDIRECT in the last 72 hours. ------------------------------------------------------------------------------------------------------------------  Recent Labs  04/21/15 0344  TSH 10.090*   ------------------------------------------------------------------------------------------------------------------ No results for input(s): VITAMINB12, FOLATE, FERRITIN, TIBC, IRON, RETICCTPCT in the last 72 hours.  Coagulation profile No results for input(s): INR, PROTIME in the last 168 hours.  No results for input(s): DDIMER in the last 72 hours.  Cardiac Enzymes No results for input(s): CKMB, TROPONINI, MYOGLOBIN in the last 168 hours.  Invalid input(s): CK ------------------------------------------------------------------------------------------------------------------ Invalid input(s): POCBNP   Time Spent in minutes   35   Lala Lund K M.D on 04/23/2015 at 9:33 AM  Between 7am to 7pm - Pager - [phone]  After 7pm go to [email or web address] - password [facility]  [facility] -  Office  [phone]

## 2015-04-23 NOTE — Care Management Important Message (Signed)
Important Message  Patient Details  Name: Katherine Miranda MRN: [id number] Date of Birth: July 12, 1920   Medicare Important Message Given:  Yes    Bethena Roys, RN 04/23/2015, 12:27 PM

## 2015-04-23 NOTE — Care Management Note (Signed)
Case Management Note  Patient Details  Name: DEYA BEADLE MRN: [id number] Date of Birth: 1921-03-06  Subjective/Objective: Pt admitted for symptomatic bradycardia. Pt is from home and is active with [name] for HHPT Services.                     Action/Plan: Recommendations for SNF placement post d/c. CSW will speak with patient in regards to placement. CM will continue to monitor.    Expected Discharge Date:                  Expected Discharge Plan:  Skilled Nursing Facility  In-House Referral:  Clinical Social Work  Discharge planning Services  CM Consult  Post Acute Care Choice:    Choice offered to:     DME Arranged:    DME Agency:     HH Arranged:    [place] Agency:     Status of Service:  In process, will continue to follow  Medicare Important Message Given:  Yes Date Medicare IM Given:    Medicare IM give by:    Date Additional Medicare IM Given:    Additional Medicare Important Message give by:     If discussed at [place] of Stay Meetings, dates discussed:    Additional Comments:  Bethena Roys, RN 04/23/2015, 3:15 PM

## 2015-04-23 NOTE — Progress Notes (Signed)
PT Cancellation Note  Patient Details Name: Katherine Miranda MRN: [id number] DOB: May 29, 1921   Cancelled Treatment:    Reason Eval/Treat Not Completed: Patient at procedure or test/unavailable.  Transport in room to take pt to [facility] and then MRI.  Will check back another time.     Jamilette Suchocki, Thornton Papas 04/23/2015, 9:21 AM

## 2015-04-23 NOTE — NC FL2 (Signed)
[place] MEDICAID FL2 LEVEL OF CARE SCREENING TOOL     IDENTIFICATION  Patient Name: Katherine Miranda Birthdate: 24-Jan-1921 Sex: female Admission Date (Current Location): 04/20/2015  [facility] and [place] Number: Herbalist and Address:  [facility] [place] [address], [place], [place] [address]      Provider Number: [id number]  Attending Physician Name and Address:  Thurnell Lose, MD  Relative Name and Phone Number:  [name] [phone]    Current Level of Care: Hospital Recommended Level of Care: [place] Prior Approval Number:    Date Approved/Denied:   PASRR Number: [id number]  Discharge Plan: SNF    Current Diagnoses: Patient Active Problem List   Diagnosis Date Noted  . Thyroid activity decreased   . Symptomatic bradycardia 04/20/2015  . Essential hypertension 09/19/2013  . Atrial fibrillation ([place]) 01/03/2013  . Subdural hygroma 10/06/2012  . Fall 10/06/2012  . Dementia 10/06/2012  . Hyponatremia 10/06/2012  . HTN (hypertension) 10/06/2012  . Dyslipidemia 10/06/2012    Orientation ACTIVITIES/SOCIAL BLADDER RESPIRATION    Self  Active Incontinent O2 (As needed) (2L)  BEHAVIORAL SYMPTOMS/MOOD NEUROLOGICAL BOWEL NUTRITION STATUS   (NONE)  (NONE) Incontinent Diet (DYS 2)  PHYSICIAN VISITS COMMUNICATION OF NEEDS Height & Weight Skin    Verbally 5\' 3"  (160 cm) 146 lbs. Normal          AMBULATORY STATUS RESPIRATION    Assist extensive O2 (As needed) (2L)      Personal Care Assistance Level of Assistance  Bathing, Dressing Bathing Assistance: Limited assistance   Dressing Assistance: Limited assistance      Functional Limitations Info   (NONE)             [place]  PT (By licensed PT), OT (By licensed OT)     PT Frequency: 5X/week OT Frequency: 5X/week           Additional Factors Info  Code Status, Allergies, Isolation Precautions Code Status Info:  DNR Allergies Info: NKDA     Isolation Precautions Info: Contact Isolation for MRSA     Current Medications (04/23/2015): Current Facility-Administered Medications  Medication Dose Route Frequency Provider Last Rate Last Dose  . 0.9 %  sodium chloride infusion   Intravenous Continuous Thurnell Lose, MD 75 mL/hr at 04/23/15 0804 1,000 mL at 04/23/15 0804  . acetaminophen (TYLENOL) tablet 650 mg  650 mg Oral Q4H PRN Luella Cook, MD      . amLODipine (NORVASC) tablet 10 mg  10 mg Oral QHS Rigoberto Noel, MD   10 mg at 04/22/15 2235  . antiseptic oral rinse (CPC / CETYLPYRIDINIUM CHLORIDE 0.05%) solution 7 mL  7 mL Mouth Rinse BID Chesley Mires, MD   7 mL at 04/23/15 1000  . apixaban (ELIQUIS) tablet 2.5 mg  2.5 mg Oral BID Skeet Simmer, [facility]      . cefTRIAXone (ROCEPHIN) 1 g in dextrose 5 % 50 mL IVPB  1 g Intravenous Q24H Chesley Mires, MD   1 g at 04/22/15 2346  . Chlorhexidine Gluconate Cloth 2 % PADS 6 each  6 each Topical Q0600 Chesley Mires, MD   6 each at 04/22/15 0600  . donepezil (ARICEPT) tablet 10 mg  10 mg Oral QHS Rigoberto Noel, MD   10 mg at 04/22/15 2235  . levothyroxine (SYNTHROID, LEVOTHROID) tablet 25 mcg  25 mcg Oral QAC breakfast Rigoberto Noel, MD   25 mcg at 04/23/15 0805  .  memantine (NAMENDA) tablet 10 mg  10 mg Oral QHS Rigoberto Noel, MD   10 mg at 04/22/15 2234  . mupirocin ointment (BACTROBAN) 2 % 1 application  1 application Nasal BID Chesley Mires, MD   1 application at [id number]  . ondansetron (ZOFRAN) injection 4 mg  4 mg Intravenous Q6H PRN Luella Cook, MD       Do not use this list as official medication orders. Please verify with discharge summary.  Discharge Medications:   Medication List    ASK your doctor about these medications        alendronate 70 MG tablet  Commonly known as:  FOSAMAX  Take 70 mg by mouth every Tuesday. Take with a full glass of water on an empty stomach.     amLODipine 10 MG tablet  Commonly known as:  NORVASC  Take  10 mg by mouth at bedtime.     amLODipine 5 MG tablet  Commonly known as:  NORVASC  Take 2 tablets (10 mg total) by mouth at bedtime.     CALCIUM PO  Take 1 tablet by mouth 2 (two) times daily.     carvedilol 12.5 MG tablet  Commonly known as:  COREG  TAKE 1 TABLET TWICE A DAY     CoQ10 200 MG Caps  Take 200 mg by mouth daily.     CRANBERRY PO  Take 300 mg by mouth daily.     donepezil 10 MG tablet  Commonly known as:  ARICEPT  Take 10 mg by mouth at bedtime.     ELIQUIS 2.5 MG Tabs tablet  Generic drug:  apixaban  TAKE 1 TABLET TWICE A DAY     Fish Oil 1000 MG Caps  Take 1,000 mg by mouth daily.     iron polysaccharides 150 MG capsule  Commonly known as:  NIFEREX  Take 150 mg by mouth daily.     loratadine 10 MG tablet  Commonly known as:  CLARITIN  Take 10 mg by mouth at bedtime.     memantine 10 MG tablet  Commonly known as:  NAMENDA  Take 10 mg by mouth at bedtime.     MYRBETRIQ 25 MG Tb24 tablet  Generic drug:  mirabegron ER  Take 25 mg by mouth daily.     timolol 0.5 % ophthalmic solution  Commonly known as:  BETIMOL  Place 1 drop into the right eye daily.     travoprost (benzalkonium) 0.004 % ophthalmic solution  Commonly known as:  TRAVATAN  Place 1 drop into both eyes daily after supper.     vitamin C 1000 MG tablet  Take 1,000 mg by mouth 2 (two) times daily.     Vitamin D 2000 UNITS tablet  Take 2,000 Units by mouth 2 (two) times daily.        Relevant Imaging Results:  Relevant Lab Results:  Recent Labs    Additional Information SSN: [id number].  Rigoberto Noel, LCSW

## 2015-04-24 DIAGNOSIS — E785 Hyperlipidemia, unspecified: Secondary | ICD-10-CM

## 2015-04-24 DIAGNOSIS — I10 Essential (primary) hypertension: Secondary | ICD-10-CM | POA: Diagnosis not present

## 2015-04-24 DIAGNOSIS — E569 Vitamin deficiency, unspecified: Secondary | ICD-10-CM | POA: Diagnosis not present

## 2015-04-24 DIAGNOSIS — J302 Other seasonal allergic rhinitis: Secondary | ICD-10-CM | POA: Diagnosis not present

## 2015-04-24 DIAGNOSIS — N390 Urinary tract infection, site not specified: Secondary | ICD-10-CM | POA: Diagnosis not present

## 2015-04-24 DIAGNOSIS — M6281 Muscle weakness (generalized): Secondary | ICD-10-CM | POA: Diagnosis not present

## 2015-04-24 DIAGNOSIS — I4891 Unspecified atrial fibrillation: Secondary | ICD-10-CM | POA: Diagnosis not present

## 2015-04-24 DIAGNOSIS — R1310 Dysphagia, unspecified: Secondary | ICD-10-CM | POA: Diagnosis not present

## 2015-04-24 DIAGNOSIS — E860 Dehydration: Secondary | ICD-10-CM | POA: Diagnosis not present

## 2015-04-24 DIAGNOSIS — N179 Acute kidney failure, unspecified: Secondary | ICD-10-CM | POA: Diagnosis not present

## 2015-04-24 DIAGNOSIS — I480 Paroxysmal atrial fibrillation: Secondary | ICD-10-CM | POA: Diagnosis not present

## 2015-04-24 DIAGNOSIS — M810 Age-related osteoporosis without current pathological fracture: Secondary | ICD-10-CM | POA: Diagnosis not present

## 2015-04-24 DIAGNOSIS — E638 Other specified nutritional deficiencies: Secondary | ICD-10-CM | POA: Diagnosis not present

## 2015-04-24 DIAGNOSIS — R001 Bradycardia, unspecified: Secondary | ICD-10-CM | POA: Diagnosis not present

## 2015-04-24 DIAGNOSIS — R41841 Cognitive communication deficit: Secondary | ICD-10-CM | POA: Diagnosis not present

## 2015-04-24 DIAGNOSIS — G9340 Encephalopathy, unspecified: Secondary | ICD-10-CM | POA: Diagnosis not present

## 2015-04-24 DIAGNOSIS — E039 Hypothyroidism, unspecified: Secondary | ICD-10-CM | POA: Diagnosis not present

## 2015-04-24 DIAGNOSIS — H401132 Primary open-angle glaucoma, bilateral, moderate stage: Secondary | ICD-10-CM | POA: Diagnosis not present

## 2015-04-24 DIAGNOSIS — G301 Alzheimer's disease with late onset: Secondary | ICD-10-CM | POA: Diagnosis not present

## 2015-04-24 DIAGNOSIS — F0390 Unspecified dementia without behavioral disturbance: Secondary | ICD-10-CM | POA: Diagnosis not present

## 2015-04-24 DIAGNOSIS — R5381 Other malaise: Secondary | ICD-10-CM | POA: Diagnosis not present

## 2015-04-24 DIAGNOSIS — E871 Hypo-osmolality and hyponatremia: Secondary | ICD-10-CM | POA: Diagnosis not present

## 2015-04-24 LAB — CBC
HCT: 36.6 % (ref 36.0–46.0)
HEMOGLOBIN: 12 g/dL (ref 12.0–15.0)
MCH: 28.5 pg (ref 26.0–34.0)
MCHC: 32.8 g/dL (ref 30.0–36.0)
MCV: 86.9 fL (ref 78.0–100.0)
Platelets: 110 10*3/uL — ABNORMAL LOW (ref 150–400)
RBC: 4.21 MIL/uL (ref 3.87–5.11)
RDW: 15.4 % (ref 11.5–15.5)
WBC: 5.4 10*3/uL (ref 4.0–10.5)

## 2015-04-24 LAB — BASIC METABOLIC PANEL
ANION GAP: 7 (ref 5–15)
BUN: 19 mg/dL (ref 6–20)
CO2: 29 mmol/L (ref 22–32)
Calcium: 8.7 mg/dL — ABNORMAL LOW (ref 8.9–10.3)
Chloride: 99 mmol/L — ABNORMAL LOW (ref 101–111)
Creatinine, Ser: 0.89 mg/dL (ref 0.44–1.00)
GFR, EST NON AFRICAN AMERICAN: 55 mL/min — AB (ref >60)
Glucose, Bld: 106 mg/dL — ABNORMAL HIGH (ref 65–99)
POTASSIUM: 4.5 mmol/L (ref 3.5–5.1)
SODIUM: 135 mmol/L (ref 135–145)

## 2015-04-24 MED ORDER — RESOURCE THICKENUP CLEAR PO POWD: ORAL | Status: DC

## 2015-04-24 MED ORDER — CEPHALEXIN 250 MG PO CAPS
250.0000 mg | ORAL_CAPSULE | Freq: Two times a day (BID) | ORAL | Status: DC
  Filled 2015-04-24 (×2): qty 1

## 2015-04-24 MED ORDER — LEVOTHYROXINE SODIUM 25 MCG PO TABS: 25.0000 ug | ORAL_TABLET | Freq: Every day | ORAL | Status: DC

## 2015-04-24 MED ORDER — CEPHALEXIN 250 MG PO CAPS
250.0000 mg | ORAL_CAPSULE | Freq: Two times a day (BID) | ORAL | Status: DC
Start: 2015-04-24 — End: 2015-07-18

## 2015-04-24 NOTE — Care Management Note (Signed)
Case Management Note  Patient Details  Name: Katherine Miranda MRN: [id number] Date of Birth: 02-Nov-1920  Subjective/Objective: Pt admitted for symptomatic bradycardia. Pt is from home and is active with [facility] for HHPT Services.                     Action/Plan: Recommendations for SNF placement post d/c. CSW will speak with patient in regards to placement. CM will continue to monitor.    Expected Discharge Date:                  Expected Discharge Plan:  Skilled Nursing Facility  In-House Referral:  Clinical Social Work  Discharge planning Services  CM Consult  Post Acute Care Choice:    Choice offered to:     DME Arranged:    DME Agency:     HH Arranged:    [place] Agency:     Status of Service:  In process, will continue to follow  Medicare Important Message Given:  Yes Date Medicare IM Given:    Medicare IM give by:    Date Additional Medicare IM Given:    Additional Medicare Important Message give by:     If discussed at [place] of Stay Meetings, dates discussed:    Additional Comments: 04/24/2015 Pt will discharge to SNF today  Maryclare Labrador, RN 04/24/2015, 10:54 AM

## 2015-04-24 NOTE — Progress Notes (Signed)
Speech Language Pathology Treatment: Dysphagia  Patient Details Name: Katherine Miranda MRN: [id number] DOB: [birth year] Today's Date: 04/24/2015 Time: 0800-0820 SLP Time Calculation (min) (ACUTE ONLY): 20 min  Assessment / Plan / Recommendation Clinical Impression  During treatment focused on dysphagia, pt demonstrated overt s/s of aspiration (wet vocal quality and immediate cough) of secretions, nectar, honey, and dysphagia 2 (fine chop). The amount of coughing did not improve with honey compared to nectar. SLP provided moderate-max cueing for clearing throat and multiple swallows. Pt is not compliant with commands due to baseline dementia. SLP called and educated pt's daughter on progression: due to baseline esophageal dysphagia pt's swallow function will not likely improve significantly and pt has an ongoing increased risk of aspiration. SLP recommends continuing current diet: dysphagia 2 (fine chop), nectar thick liquids, and pills crushed with puree. SLP will f/u with diet tolerance and facilitating the safest feeding environment.   HPI HPI: 81 yo female with PMH: HTN, dementia, A-fib, arthritis brought in to the ED due to not feeling well for several days. Pt seen in the ED 11/14 for questionable CVA with CT showing hydrocephalus and discharged. Per MD note, heart rate fluctuated from 70's to 20's and femoral artery pacer placed. Also found to be hypothermic.CXR mild bibasilar atelectasis and small effusions      SLP Plan  Continue with current plan of care     Recommendations  Diet recommendations: Dysphagia 2 (fine chop);Nectar-thick liquid Liquids provided via: Cup;No straw Medication Administration: Whole meds with puree Supervision: Patient able to self feed;Full supervision/cueing for compensatory strategies Compensations: Minimize environmental distractions;Small sips/bites;Slow rate;Multiple dry swallows after each bite/sip;Follow solids with liquid;Clear throat  intermittently Postural Changes and/or Swallow Maneuvers: Seated upright 90 degrees;Upright 30-60 min after meal              Oral Care Recommendations: Oral care BID Follow up Recommendations: Skilled Nursing facility Plan: Continue with current plan of care  Lanier Ensign, Student-SLP  Lanier Ensign 04/24/2015, 9:10 AM

## 2015-04-24 NOTE — Plan of Care (Signed)
Problem: SLP Dysphagia Goals Goal: Misc Dysphagia Goal Outcome: Not Met (add Reason) Diet downgraded

## 2015-04-24 NOTE — Progress Notes (Signed)
Physical Therapy Treatment Patient Details Name: Katherine Miranda MRN: [id number] DOB: September 01, 1920 Today's Date: 04/24/2015    History of Present Illness pt presents with Bradycardia and Hypothermia.  pt with recent admit for Bradycardia and had temporary pacer used and was found to have questionable hydrocephalus.  pt with hx of Dementia, HTN, and A-fib.      PT Comments    Pt able to participate in transfer to/from 3-in-1, but does require 2 person A.  Pt pleasantly confused throughout session and participates well.  Continue to feel SNF is best D/C option for pt.  Will continue to follow.    Follow Up Recommendations  SNF     Equipment Recommendations  None recommended by PT    Recommendations for Other Services       Precautions / Restrictions Precautions Precautions: Fall Restrictions Weight Bearing Restrictions: No    Mobility  Bed Mobility Overal bed mobility: Needs Assistance;+2 for physical assistance Bed Mobility: Supine to Sit;Sit to Supine     Supine to sit: Mod assist;+2 for physical assistance;HOB elevated Sit to supine: Mod assist;+2 for physical assistance   General bed mobility comments: pt does participate in bed mobility, but needs A for trunk and Bil LEs.    Transfers Overall transfer level: Needs assistance Equipment used: 2 person hand held assist Transfers: Sit to/from [facility] Sit to Stand: Max assist;+2 physical assistance Stand pivot transfers: Max assist;+2 physical assistance       General transfer comment: cues for encouragement, technique, and staying on task.  pt needs facilitation for more erect posture and pivot to/from 3-in-1.    Ambulation/Gait                 Stairs            Wheelchair Mobility    Modified Rankin (Stroke Patients Only) Modified Rankin (Stroke Patients Only) Pre-Morbid Rankin Score: Moderately severe disability Modified Rankin: Severe disability     Balance Overall  balance assessment: Needs assistance Sitting-balance support: Bilateral upper extremity supported;Feet supported Sitting balance-Leahy Scale: Poor Sitting balance - Comments: pt leans to L side in sitting.  when cued to attend to balance, pt does attempt to correct.   Postural control: Posterior lean;Left lateral lean Standing balance support: During functional activity Standing balance-Leahy Scale: Poor                      Cognition Arousal/Alertness: Awake/alert Behavior During Therapy: Flat affect Overall Cognitive Status: No family/caregiver present to determine baseline cognitive functioning                      Exercises      General Comments        Pertinent Vitals/Pain Pain Assessment: Faces Faces Pain Scale: No hurt    Home Living                      Prior Function            PT Goals (current goals can now be found in the care plan section) Acute Rehab PT Goals Patient Stated Goal: pt unable to state. PT Goal Formulation: Patient unable to participate in goal setting Time For Goal Achievement: 05/07/15 Potential to Achieve Goals: Fair Progress towards PT goals: Progressing toward goals    Frequency  Min 2X/week    PT Plan Current plan remains appropriate    Co-evaluation  End of Session Equipment Utilized During Treatment: Gait belt;Oxygen Activity Tolerance: Patient limited by fatigue Patient left: in bed;with call bell/phone within reach;with bed alarm set     Time: [id number] PT Time Calculation (min) (ACUTE ONLY): 22 min  Charges:  $Therapeutic Activity: 8-22 mins                    G CodesCatarina Hartshorn, [place] 04/24/2015, 9:57 AM

## 2015-04-24 NOTE — Clinical Social Work Placement (Signed)
   CLINICAL SOCIAL WORK PLACEMENT  NOTE  Date:  04/24/2015  Patient Details  Name: Katherine Miranda MRN: [id number] Date of Birth: 06/04/1920  Clinical Social Work is seeking post-discharge placement for this patient at the [place] level of care (*CSW will initial, date and re-position this form in  chart as items are completed):  Yes   Patient/family provided with [facility] Work Department's list of facilities offering this level of care within the geographic area requested by the patient (or if unable, by the patient's family).  Yes   Patient/family informed of their freedom to choose among providers that offer the needed level of care, that participate in Medicare, Medicaid or managed care program needed by the patient, have an available bed and are willing to accept the patient.  Yes   Patient/family informed of [facility]'s ownership interest in [facility] and [facility], as well as of the fact that they are under no obligation to receive care at these facilities.  PASRR submitted to EDS on       PASRR number received on       Existing PASRR number confirmed on 04/23/15     FL2 transmitted to all facilities in geographic area requested by pt/family on 04/23/15     FL2 transmitted to all facilities within larger geographic area on       Patient informed that his/her managed care company has contracts with or will negotiate with certain facilities, including the following:        Yes   Patient/family informed of bed offers received.  Patient chooses bed at [facility]     Physician recommends and patient chooses bed at      Patient to be transferred to [facility] on 04/24/15.  Patient to be transferred to facility by Ambulance     Patient family notified on 04/24/15 of transfer.  Name of family member notified:  [name]     PHYSICIAN       Additional Comment:  Per MD patient ready for DC to [facility]. RN, patient,  patient's family, and facility notified of DC. RN given number for report. DC packet on chart. Ambulance transport requested for patient for 3:00PM per facility's request. CSW signing off.  Liz Beach MSW, LCSW, [place], [phone] _______________________________________________ Rigoberto Noel, LCSW 04/24/2015, 2:15 PM

## 2015-04-24 NOTE — Discharge Summary (Addendum)
Physician Discharge Summary   Patient ID: ROXI ERNSTER MRN: [id number] DOB/AGE: [birth year] 81 y.o.  Admit date: 04/20/2015 Discharge date: 04/24/2015  Primary Care Physician:  [name], MD  Discharge Diagnoses:     Bradycardia with hypothermia    Hypothyroidism    Severe dehydration with hypernatremia, acute kidney injury     Advanced dementia    Dysphagia  . Hyponatremia . chronic Atrial fibrillation ([place]) . Symptomatic bradycardia . Dyslipidemia . HTN (hypertension)  Consults: Cardiology, EP, Dr. Crissie Sickles  Recommendations for Outpatient Follow-up:  1. Please repeat CBC/BMET at next visit 2. Continue Keflex 250 mg twice a day for 4 more days    DIET: *Dysphagia 2 diet with nectar thick liquids    Allergies:  No Known Allergies   DISCHARGE MEDICATIONS: Current Discharge Medication List    START taking these medications   Details  cephALEXin (KEFLEX) 250 MG capsule Take 1 capsule (250 mg total) by mouth 2 (two) times daily.    levothyroxine (SYNTHROID, LEVOTHROID) 25 MCG tablet Take 1 tablet (25 mcg total) by mouth daily before breakfast.    Maltodextrin-Xanthan Gum (RESOURCE THICKENUP CLEAR) POWD NECTAR THICK LIQUIDS      CONTINUE these medications which have NOT CHANGED   Details  alendronate (FOSAMAX) 70 MG tablet Take 70 mg by mouth every Tuesday. Take with a full glass of water on an empty stomach.    amLODipine (NORVASC) 10 MG tablet Take 10 mg by mouth at bedtime.    Ascorbic Acid (VITAMIN C) 1000 MG tablet Take 1,000 mg by mouth 2 (two) times daily.     CALCIUM PO Take 1 tablet by mouth 2 (two) times daily.     Cholecalciferol (VITAMIN D) 2000 UNITS tablet Take 2,000 Units by mouth 2 (two) times daily.    Coenzyme Q10 (COQ10) 200 MG CAPS Take 200 mg by mouth daily.    CRANBERRY PO Take 300 mg by mouth daily.     donepezil (ARICEPT) 10 MG tablet Take 10 mg by mouth at bedtime.    ELIQUIS 2.5 MG TABS tablet TAKE 1 TABLET TWICE A  DAY Qty: 180 tablet, Refills: 3    iron polysaccharides (NIFEREX) 150 MG capsule Take 150 mg by mouth daily.    loratadine (CLARITIN) 10 MG tablet Take 10 mg by mouth at bedtime.    memantine (NAMENDA) 10 MG tablet Take 10 mg by mouth at bedtime.     Omega-3 Fatty Acids (FISH OIL) 1000 MG CAPS Take 1,000 mg by mouth daily.      STOP taking these medications     carvedilol (COREG) 12.5 MG tablet      mirabegron ER (MYRBETRIQ) 25 MG TB24 tablet      timolol (BETIMOL) 0.5 % ophthalmic solution      travoprost, benzalkonium, (TRAVATAN) 0.004 % ophthalmic solution          Brief H and P: For complete details please refer to admission H and P, but in brief This is a 81 year old Caucasian female with history of advanced dementia who lives at home with 24-hour supervision, chronic atrial fibrillation on Eliquis under the care of Dr. Loistine Simas, arthritis, essential hypertension, who was brought to the ER with hypothermia and severe bradycardia along with elevated TSH around 10 few days ago. She was initially admitted by pulmonary critical care, she was on beta blocker which was stopped, she was seen by cardiology required temporary venous pacemaker placement for a few days. Pacemaker was discontinued and her heart rate  stabilized.  She was transferred to the floor on 04/22/15.  Hospital Course:  1. Bradycardia and hypothermia. Likely due to combination of Coreg, beta blocker eyedrops and hypothyroidism. Patient presented to ED with feeling ill, noted to have bradycardia with heart rate in 20s and hypothermia. She received several doses of atropine and external pacemaker was placed. Cardiology was consulted and patient was admitted to ICU by critical care service. Patient went to the Cath Lab and external pacemaker was placed. Per cardiology, likely bradycardia was secondary to combination of Coreg, beta blocker eyedrops and hypothyroidism. Outpatient Mirabegron was also discontinued.  Much  improved, temporary venous pacemaker discontinued on 04/22/15. Heart rate has remained stable, currently 71 at the time of discharge.  Amlodipine was resumed on 11/21 for hypertension.   2. Hypothermia was also likely due to severe bradycardia versus sepsis. UA was positive for UTI. Patient has received IV antibiotics during hospitalization. Blood cultures remain negative  3 Chronic atrial fibrillation. No rate controlling agents due to above, seen by cardiology this admission, restart home dose Eliquis.CHA2DS2-Vasc score 4 (age, female, HTN)    4. Hypothyroidism. TSH 10.09 Started on Synthroid, please obtain TSH, free T4 in 4 weeks   5. Severe dehydration with hyponatremia and acute renal failure.  -Resolved with gentle hydration, sodium 135 at the time of discharge, creatinine 0.89.   6 Advanced dementia with acute encephalopathy - MRI of the brain was negative for acute infarct. PT evaluation was done which recommended a skilled nursing facility. Swallow evaluation recommended dysphagia 2 diet with nectar thick liquids. Discussed in detail with patient's daughter prior to discharge and she mentioned that patient does have advanced dementia and she is currently at her baseline mental status  7. Dysphagia. MRI negative for acute CVA, speech therapy recommended dysphagia 2 diet with nectar thick liquids   8 Essential hypertension. On Norvasc   Day of Discharge BP 120/46 mmHg  Pulse 86  Temp(Src) 98.3 F (36.8 C) (Oral)  Resp 16  Ht 5' 3.5" (1.613 m)  Wt 66.18 kg (145 lb 14.4 oz)  BMI 25.44 kg/m2  SpO2 98%  Physical Exam: General: Alert and awake oriented to self not in any acute distress. HEENT: anicteric sclera, pupils reactive to light and accommodation CVS: S1-S2 clear no murmur rubs or gallops Chest: clear to auscultation bilaterally, no wheezing rales or rhonchi Abdomen: soft nontender, nondistended, normal bowel sounds Extremities: no cyanosis, clubbing or edema noted  bilaterally    The results of significant diagnostics from this hospitalization (including imaging, microbiology, ancillary and laboratory) are listed below for reference.    LAB RESULTS: Basic Metabolic Panel:  Recent Labs Lab 04/21/15 0344 04/22/15 0410 04/23/15 0418 04/24/15 0250  NA 126* 137 132* 135  K 5.5* 3.7 4.0 4.5  CL 92* 99* 94* 99*  CO2 25 30 29 29   GLUCOSE 129* 85 106* 106*  BUN 44* 18 21* 19  CREATININE 1.25* 0.98 1.07* 0.89  CALCIUM 9.8 9.0 8.9 8.7*  MG 2.0 1.6*  --   --   PHOS 5.5*  --   --   --    Liver Function Tests: No results for input(s): AST, ALT, ALKPHOS, BILITOT, PROT, ALBUMIN in the last 168 hours. No results for input(s): LIPASE, AMYLASE in the last 168 hours. No results for input(s): AMMONIA in the last 168 hours. CBC:  Recent Labs Lab 04/20/15 2022  04/22/15 0410 04/24/15 0250  WBC 6.7  < > 6.6 5.4  NEUTROABS 5.4  --   --   --  HGB 11.9*  < > 12.2 12.0  HCT 34.8*  < > 35.4* 36.6  MCV 85.1  < > 85.1 86.9  PLT 137*  < > 114* 110*  < > = values in this interval not displayed. Cardiac Enzymes: No results for input(s): CKTOTAL, CKMB, CKMBINDEX, TROPONINI in the last 168 hours. BNP: Invalid input(s): POCBNP CBG:  Recent Labs Lab 04/21/15 0035 04/21/15 0845  GLUCAP 165* 83    Significant Diagnostic Studies:  Dg Chest Port 1 View  04/21/2015  CLINICAL DATA:  Airway aspiration EXAM: PORTABLE CHEST 1 VIEW COMPARISON:  04/20/2015 FINDINGS: Defibrillator pad obscures the upper mediastinum Cardiac enlargement without heart failure. Mild bibasilar atelectasis and small effusions. No change from yesterday IMPRESSION: Mild bibasilar atelectasis and small effusions. No change from yesterday. Electronically Signed   By: Franchot Gallo M.D.   On: 04/21/2015 09:42   Dg Chest Portable 1 View  04/20/2015  CLINICAL DATA:  Bradycardia EXAM: PORTABLE CHEST 1 VIEW COMPARISON:  04/15/2015 chest radiograph. FINDINGS: Pacer pad overlies the left chest.  Stable cardiomediastinal silhouette with mild cardiomegaly. No pneumothorax. No pleural effusion. No pulmonary edema. Mild patchy bibasilar lung opacities. IMPRESSION: 1. Stable mild cardiomegaly without pulmonary edema. 2. Mild patchy bibasilar lung opacities, favor atelectasis. Electronically Signed   By: Ilona Sorrel M.D.   On: 04/20/2015 19:55    2D ECHO:   Disposition and Follow-up: Discharge Instructions    Discharge instructions    Complete by:  As directed   Discharge diet: Dysphagia 2, Nectar Thick     Increase activity slowly    Complete by:  As directed             DISPOSITION: Skilled nursing facility   DISCHARGE FOLLOW-UP Follow-up Information    Follow up with SHAW,[name], MD. Schedule an appointment as soon as possible for a visit in 2 weeks.   Specialty:  Family Medicine   Why:  for hospital follow-up   Contact information:   [address] [place] [address] [phone]        Time spent on Discharge: 45 minutes  Signed:   Daley Mooradian M.D. [facility] 04/24/2015, 11:07 AM Pager: [phone]   Coding Query Patient had no sepsis at the time of admission. She had bradycardia with hypothermia due to combination of Coreg, beta blocker eyedrops and hypothyroidism.   Cresencio Reesor M.D. [facility] 04/29/2015, 12:58 PM  Pager: [phone]

## 2015-04-25 LAB — CULTURE, BLOOD (ROUTINE X 2)
CULTURE: NO GROWTH
CULTURE: NO GROWTH

## 2015-04-26 DIAGNOSIS — R5381 Other malaise: Secondary | ICD-10-CM | POA: Diagnosis not present

## 2015-04-26 DIAGNOSIS — I10 Essential (primary) hypertension: Secondary | ICD-10-CM | POA: Diagnosis not present

## 2015-04-26 DIAGNOSIS — E871 Hypo-osmolality and hyponatremia: Secondary | ICD-10-CM | POA: Diagnosis not present

## 2015-04-26 DIAGNOSIS — G301 Alzheimer's disease with late onset: Secondary | ICD-10-CM | POA: Diagnosis not present

## 2015-04-26 DIAGNOSIS — I4891 Unspecified atrial fibrillation: Secondary | ICD-10-CM | POA: Diagnosis not present

## 2015-05-02 DIAGNOSIS — H401132 Primary open-angle glaucoma, bilateral, moderate stage: Secondary | ICD-10-CM | POA: Diagnosis not present

## 2015-05-12 DIAGNOSIS — I4891 Unspecified atrial fibrillation: Secondary | ICD-10-CM | POA: Diagnosis not present

## 2015-05-12 DIAGNOSIS — R5381 Other malaise: Secondary | ICD-10-CM | POA: Diagnosis not present

## 2015-05-12 DIAGNOSIS — G301 Alzheimer's disease with late onset: Secondary | ICD-10-CM | POA: Diagnosis not present

## 2015-05-12 DIAGNOSIS — I10 Essential (primary) hypertension: Secondary | ICD-10-CM | POA: Diagnosis not present

## 2015-05-15 ENCOUNTER — Other Ambulatory Visit (HOSPITAL_COMMUNITY): Payer: Self-pay | Admitting: Geriatric Medicine

## 2015-05-15 DIAGNOSIS — R1310 Dysphagia, unspecified: Secondary | ICD-10-CM

## 2015-05-22 ENCOUNTER — Ambulatory Visit (HOSPITAL_COMMUNITY): Payer: No Typology Code available for payment source

## 2015-05-23 ENCOUNTER — Ambulatory Visit (HOSPITAL_COMMUNITY)
Admission: RE | Admit: 2015-05-23 | Discharge: 2015-05-23 | Disposition: A | Discharge status: Home / Self Care | Payer: No Typology Code available for payment source | Source: Ambulatory Visit | Attending: Geriatric Medicine | Admitting: Geriatric Medicine

## 2015-05-23 DIAGNOSIS — R1310 Dysphagia, unspecified: Secondary | ICD-10-CM | POA: Insufficient documentation

## 2015-05-23 DIAGNOSIS — I10 Essential (primary) hypertension: Secondary | ICD-10-CM | POA: Insufficient documentation

## 2015-06-06 DIAGNOSIS — R4781 Slurred speech: Secondary | ICD-10-CM | POA: Diagnosis not present

## 2015-06-06 DIAGNOSIS — I10 Essential (primary) hypertension: Secondary | ICD-10-CM | POA: Diagnosis not present

## 2015-06-06 DIAGNOSIS — R2689 Other abnormalities of gait and mobility: Secondary | ICD-10-CM | POA: Diagnosis not present

## 2015-06-06 DIAGNOSIS — R471 Dysarthria and anarthria: Secondary | ICD-10-CM | POA: Diagnosis not present

## 2015-06-06 DIAGNOSIS — F0390 Unspecified dementia without behavioral disturbance: Secondary | ICD-10-CM | POA: Diagnosis not present

## 2015-06-06 DIAGNOSIS — M1991 Primary osteoarthritis, unspecified site: Secondary | ICD-10-CM | POA: Diagnosis not present

## 2015-06-07 DIAGNOSIS — N183 Chronic kidney disease, stage 3 (moderate): Secondary | ICD-10-CM | POA: Diagnosis not present

## 2015-06-07 DIAGNOSIS — E039 Hypothyroidism, unspecified: Secondary | ICD-10-CM | POA: Diagnosis not present

## 2015-06-07 DIAGNOSIS — F0390 Unspecified dementia without behavioral disturbance: Secondary | ICD-10-CM | POA: Diagnosis not present

## 2015-06-07 DIAGNOSIS — I129 Hypertensive chronic kidney disease with stage 1 through stage 4 chronic kidney disease, or unspecified chronic kidney disease: Secondary | ICD-10-CM | POA: Diagnosis not present

## 2015-06-07 DIAGNOSIS — R001 Bradycardia, unspecified: Secondary | ICD-10-CM | POA: Diagnosis not present

## 2015-06-07 DIAGNOSIS — R5381 Other malaise: Secondary | ICD-10-CM | POA: Diagnosis not present

## 2015-06-07 DIAGNOSIS — H409 Unspecified glaucoma: Secondary | ICD-10-CM | POA: Diagnosis not present

## 2015-06-07 DIAGNOSIS — I482 Chronic atrial fibrillation: Secondary | ICD-10-CM | POA: Diagnosis not present

## 2015-06-08 DIAGNOSIS — M1991 Primary osteoarthritis, unspecified site: Secondary | ICD-10-CM | POA: Diagnosis not present

## 2015-06-08 DIAGNOSIS — R471 Dysarthria and anarthria: Secondary | ICD-10-CM | POA: Diagnosis not present

## 2015-06-08 DIAGNOSIS — F0390 Unspecified dementia without behavioral disturbance: Secondary | ICD-10-CM | POA: Diagnosis not present

## 2015-06-08 DIAGNOSIS — R4781 Slurred speech: Secondary | ICD-10-CM | POA: Diagnosis not present

## 2015-06-08 DIAGNOSIS — R2689 Other abnormalities of gait and mobility: Secondary | ICD-10-CM | POA: Diagnosis not present

## 2015-06-08 DIAGNOSIS — I10 Essential (primary) hypertension: Secondary | ICD-10-CM | POA: Diagnosis not present

## 2015-06-10 DIAGNOSIS — R4781 Slurred speech: Secondary | ICD-10-CM | POA: Diagnosis not present

## 2015-06-10 DIAGNOSIS — F0390 Unspecified dementia without behavioral disturbance: Secondary | ICD-10-CM | POA: Diagnosis not present

## 2015-06-10 DIAGNOSIS — M1991 Primary osteoarthritis, unspecified site: Secondary | ICD-10-CM | POA: Diagnosis not present

## 2015-06-10 DIAGNOSIS — I10 Essential (primary) hypertension: Secondary | ICD-10-CM | POA: Diagnosis not present

## 2015-06-10 DIAGNOSIS — R2689 Other abnormalities of gait and mobility: Secondary | ICD-10-CM | POA: Diagnosis not present

## 2015-06-10 DIAGNOSIS — R471 Dysarthria and anarthria: Secondary | ICD-10-CM | POA: Diagnosis not present

## 2015-06-11 DIAGNOSIS — R4781 Slurred speech: Secondary | ICD-10-CM | POA: Diagnosis not present

## 2015-06-11 DIAGNOSIS — I10 Essential (primary) hypertension: Secondary | ICD-10-CM | POA: Diagnosis not present

## 2015-06-11 DIAGNOSIS — M1991 Primary osteoarthritis, unspecified site: Secondary | ICD-10-CM | POA: Diagnosis not present

## 2015-06-11 DIAGNOSIS — R2689 Other abnormalities of gait and mobility: Secondary | ICD-10-CM | POA: Diagnosis not present

## 2015-06-11 DIAGNOSIS — R471 Dysarthria and anarthria: Secondary | ICD-10-CM | POA: Diagnosis not present

## 2015-06-11 DIAGNOSIS — F0390 Unspecified dementia without behavioral disturbance: Secondary | ICD-10-CM | POA: Diagnosis not present

## 2015-06-14 DIAGNOSIS — F0390 Unspecified dementia without behavioral disturbance: Secondary | ICD-10-CM | POA: Diagnosis not present

## 2015-06-14 DIAGNOSIS — M1991 Primary osteoarthritis, unspecified site: Secondary | ICD-10-CM | POA: Diagnosis not present

## 2015-06-14 DIAGNOSIS — R2689 Other abnormalities of gait and mobility: Secondary | ICD-10-CM | POA: Diagnosis not present

## 2015-06-14 DIAGNOSIS — R4781 Slurred speech: Secondary | ICD-10-CM | POA: Diagnosis not present

## 2015-06-14 DIAGNOSIS — R471 Dysarthria and anarthria: Secondary | ICD-10-CM | POA: Diagnosis not present

## 2015-06-14 DIAGNOSIS — I10 Essential (primary) hypertension: Secondary | ICD-10-CM | POA: Diagnosis not present

## 2015-06-15 DIAGNOSIS — F0390 Unspecified dementia without behavioral disturbance: Secondary | ICD-10-CM | POA: Diagnosis not present

## 2015-06-15 DIAGNOSIS — R2689 Other abnormalities of gait and mobility: Secondary | ICD-10-CM | POA: Diagnosis not present

## 2015-06-15 DIAGNOSIS — Z9181 History of falling: Secondary | ICD-10-CM | POA: Diagnosis not present

## 2015-06-15 DIAGNOSIS — R471 Dysarthria and anarthria: Secondary | ICD-10-CM | POA: Diagnosis not present

## 2015-06-15 DIAGNOSIS — I10 Essential (primary) hypertension: Secondary | ICD-10-CM | POA: Diagnosis not present

## 2015-06-15 DIAGNOSIS — Z7901 Long term (current) use of anticoagulants: Secondary | ICD-10-CM | POA: Diagnosis not present

## 2015-06-15 DIAGNOSIS — R32 Unspecified urinary incontinence: Secondary | ICD-10-CM | POA: Diagnosis not present

## 2015-06-15 DIAGNOSIS — M810 Age-related osteoporosis without current pathological fracture: Secondary | ICD-10-CM | POA: Diagnosis not present

## 2015-06-15 DIAGNOSIS — Z87440 Personal history of urinary (tract) infections: Secondary | ICD-10-CM | POA: Diagnosis not present

## 2015-06-15 DIAGNOSIS — I4891 Unspecified atrial fibrillation: Secondary | ICD-10-CM | POA: Diagnosis not present

## 2015-06-15 DIAGNOSIS — R1310 Dysphagia, unspecified: Secondary | ICD-10-CM | POA: Diagnosis not present

## 2015-06-15 DIAGNOSIS — M1991 Primary osteoarthritis, unspecified site: Secondary | ICD-10-CM | POA: Diagnosis not present

## 2015-06-15 DIAGNOSIS — N183 Chronic kidney disease, stage 3 (moderate): Secondary | ICD-10-CM | POA: Diagnosis not present

## 2015-06-17 DIAGNOSIS — M810 Age-related osteoporosis without current pathological fracture: Secondary | ICD-10-CM | POA: Diagnosis not present

## 2015-06-17 DIAGNOSIS — I10 Essential (primary) hypertension: Secondary | ICD-10-CM | POA: Diagnosis not present

## 2015-06-17 DIAGNOSIS — N183 Chronic kidney disease, stage 3 (moderate): Secondary | ICD-10-CM | POA: Diagnosis not present

## 2015-06-17 DIAGNOSIS — R1310 Dysphagia, unspecified: Secondary | ICD-10-CM | POA: Diagnosis not present

## 2015-06-17 DIAGNOSIS — M1991 Primary osteoarthritis, unspecified site: Secondary | ICD-10-CM | POA: Diagnosis not present

## 2015-06-17 DIAGNOSIS — I4891 Unspecified atrial fibrillation: Secondary | ICD-10-CM | POA: Diagnosis not present

## 2015-06-18 DIAGNOSIS — R1310 Dysphagia, unspecified: Secondary | ICD-10-CM | POA: Diagnosis not present

## 2015-06-18 DIAGNOSIS — I4891 Unspecified atrial fibrillation: Secondary | ICD-10-CM | POA: Diagnosis not present

## 2015-06-18 DIAGNOSIS — M810 Age-related osteoporosis without current pathological fracture: Secondary | ICD-10-CM | POA: Diagnosis not present

## 2015-06-18 DIAGNOSIS — M1991 Primary osteoarthritis, unspecified site: Secondary | ICD-10-CM | POA: Diagnosis not present

## 2015-06-18 DIAGNOSIS — N183 Chronic kidney disease, stage 3 (moderate): Secondary | ICD-10-CM | POA: Diagnosis not present

## 2015-06-18 DIAGNOSIS — I10 Essential (primary) hypertension: Secondary | ICD-10-CM | POA: Diagnosis not present

## 2015-06-19 DIAGNOSIS — I10 Essential (primary) hypertension: Secondary | ICD-10-CM | POA: Diagnosis not present

## 2015-06-19 DIAGNOSIS — N183 Chronic kidney disease, stage 3 (moderate): Secondary | ICD-10-CM | POA: Diagnosis not present

## 2015-06-19 DIAGNOSIS — M810 Age-related osteoporosis without current pathological fracture: Secondary | ICD-10-CM | POA: Diagnosis not present

## 2015-06-19 DIAGNOSIS — R1310 Dysphagia, unspecified: Secondary | ICD-10-CM | POA: Diagnosis not present

## 2015-06-19 DIAGNOSIS — I4891 Unspecified atrial fibrillation: Secondary | ICD-10-CM | POA: Diagnosis not present

## 2015-06-19 DIAGNOSIS — M1991 Primary osteoarthritis, unspecified site: Secondary | ICD-10-CM | POA: Diagnosis not present

## 2015-06-21 DIAGNOSIS — I4891 Unspecified atrial fibrillation: Secondary | ICD-10-CM | POA: Diagnosis not present

## 2015-06-21 DIAGNOSIS — M1991 Primary osteoarthritis, unspecified site: Secondary | ICD-10-CM | POA: Diagnosis not present

## 2015-06-21 DIAGNOSIS — R1310 Dysphagia, unspecified: Secondary | ICD-10-CM | POA: Diagnosis not present

## 2015-06-21 DIAGNOSIS — N183 Chronic kidney disease, stage 3 (moderate): Secondary | ICD-10-CM | POA: Diagnosis not present

## 2015-06-21 DIAGNOSIS — I10 Essential (primary) hypertension: Secondary | ICD-10-CM | POA: Diagnosis not present

## 2015-06-21 DIAGNOSIS — M810 Age-related osteoporosis without current pathological fracture: Secondary | ICD-10-CM | POA: Diagnosis not present

## 2015-06-24 DIAGNOSIS — M810 Age-related osteoporosis without current pathological fracture: Secondary | ICD-10-CM | POA: Diagnosis not present

## 2015-06-24 DIAGNOSIS — M1991 Primary osteoarthritis, unspecified site: Secondary | ICD-10-CM | POA: Diagnosis not present

## 2015-06-24 DIAGNOSIS — R1310 Dysphagia, unspecified: Secondary | ICD-10-CM | POA: Diagnosis not present

## 2015-06-24 DIAGNOSIS — N183 Chronic kidney disease, stage 3 (moderate): Secondary | ICD-10-CM | POA: Diagnosis not present

## 2015-06-24 DIAGNOSIS — I4891 Unspecified atrial fibrillation: Secondary | ICD-10-CM | POA: Diagnosis not present

## 2015-06-24 DIAGNOSIS — I10 Essential (primary) hypertension: Secondary | ICD-10-CM | POA: Diagnosis not present

## 2015-06-25 DIAGNOSIS — M810 Age-related osteoporosis without current pathological fracture: Secondary | ICD-10-CM | POA: Diagnosis not present

## 2015-06-25 DIAGNOSIS — I10 Essential (primary) hypertension: Secondary | ICD-10-CM | POA: Diagnosis not present

## 2015-06-25 DIAGNOSIS — I4891 Unspecified atrial fibrillation: Secondary | ICD-10-CM | POA: Diagnosis not present

## 2015-06-25 DIAGNOSIS — R1310 Dysphagia, unspecified: Secondary | ICD-10-CM | POA: Diagnosis not present

## 2015-06-25 DIAGNOSIS — M1991 Primary osteoarthritis, unspecified site: Secondary | ICD-10-CM | POA: Diagnosis not present

## 2015-06-25 DIAGNOSIS — N183 Chronic kidney disease, stage 3 (moderate): Secondary | ICD-10-CM | POA: Diagnosis not present

## 2015-06-26 DIAGNOSIS — I4891 Unspecified atrial fibrillation: Secondary | ICD-10-CM | POA: Diagnosis not present

## 2015-06-26 DIAGNOSIS — R1310 Dysphagia, unspecified: Secondary | ICD-10-CM | POA: Diagnosis not present

## 2015-06-26 DIAGNOSIS — N183 Chronic kidney disease, stage 3 (moderate): Secondary | ICD-10-CM | POA: Diagnosis not present

## 2015-06-26 DIAGNOSIS — I10 Essential (primary) hypertension: Secondary | ICD-10-CM | POA: Diagnosis not present

## 2015-06-26 DIAGNOSIS — M810 Age-related osteoporosis without current pathological fracture: Secondary | ICD-10-CM | POA: Diagnosis not present

## 2015-06-26 DIAGNOSIS — M1991 Primary osteoarthritis, unspecified site: Secondary | ICD-10-CM | POA: Diagnosis not present

## 2015-06-28 DIAGNOSIS — M810 Age-related osteoporosis without current pathological fracture: Secondary | ICD-10-CM | POA: Diagnosis not present

## 2015-06-28 DIAGNOSIS — R1310 Dysphagia, unspecified: Secondary | ICD-10-CM | POA: Diagnosis not present

## 2015-06-28 DIAGNOSIS — N183 Chronic kidney disease, stage 3 (moderate): Secondary | ICD-10-CM | POA: Diagnosis not present

## 2015-06-28 DIAGNOSIS — M1991 Primary osteoarthritis, unspecified site: Secondary | ICD-10-CM | POA: Diagnosis not present

## 2015-06-28 DIAGNOSIS — I4891 Unspecified atrial fibrillation: Secondary | ICD-10-CM | POA: Diagnosis not present

## 2015-06-28 DIAGNOSIS — I10 Essential (primary) hypertension: Secondary | ICD-10-CM | POA: Diagnosis not present

## 2015-07-01 DIAGNOSIS — I10 Essential (primary) hypertension: Secondary | ICD-10-CM | POA: Diagnosis not present

## 2015-07-01 DIAGNOSIS — N183 Chronic kidney disease, stage 3 (moderate): Secondary | ICD-10-CM | POA: Diagnosis not present

## 2015-07-01 DIAGNOSIS — R1310 Dysphagia, unspecified: Secondary | ICD-10-CM | POA: Diagnosis not present

## 2015-07-01 DIAGNOSIS — M1991 Primary osteoarthritis, unspecified site: Secondary | ICD-10-CM | POA: Diagnosis not present

## 2015-07-01 DIAGNOSIS — M810 Age-related osteoporosis without current pathological fracture: Secondary | ICD-10-CM | POA: Diagnosis not present

## 2015-07-01 DIAGNOSIS — I4891 Unspecified atrial fibrillation: Secondary | ICD-10-CM | POA: Diagnosis not present

## 2015-07-02 DIAGNOSIS — N183 Chronic kidney disease, stage 3 (moderate): Secondary | ICD-10-CM | POA: Diagnosis not present

## 2015-07-02 DIAGNOSIS — I10 Essential (primary) hypertension: Secondary | ICD-10-CM | POA: Diagnosis not present

## 2015-07-02 DIAGNOSIS — M1991 Primary osteoarthritis, unspecified site: Secondary | ICD-10-CM | POA: Diagnosis not present

## 2015-07-02 DIAGNOSIS — I4891 Unspecified atrial fibrillation: Secondary | ICD-10-CM | POA: Diagnosis not present

## 2015-07-02 DIAGNOSIS — R1310 Dysphagia, unspecified: Secondary | ICD-10-CM | POA: Diagnosis not present

## 2015-07-02 DIAGNOSIS — M810 Age-related osteoporosis without current pathological fracture: Secondary | ICD-10-CM | POA: Diagnosis not present

## 2015-07-03 DIAGNOSIS — H401112 Primary open-angle glaucoma, right eye, moderate stage: Secondary | ICD-10-CM | POA: Diagnosis not present

## 2015-07-03 DIAGNOSIS — H401122 Primary open-angle glaucoma, left eye, moderate stage: Secondary | ICD-10-CM | POA: Diagnosis not present

## 2015-07-04 DIAGNOSIS — R1310 Dysphagia, unspecified: Secondary | ICD-10-CM | POA: Diagnosis not present

## 2015-07-04 DIAGNOSIS — N183 Chronic kidney disease, stage 3 (moderate): Secondary | ICD-10-CM | POA: Diagnosis not present

## 2015-07-04 DIAGNOSIS — M1991 Primary osteoarthritis, unspecified site: Secondary | ICD-10-CM | POA: Diagnosis not present

## 2015-07-04 DIAGNOSIS — I10 Essential (primary) hypertension: Secondary | ICD-10-CM | POA: Diagnosis not present

## 2015-07-04 DIAGNOSIS — M810 Age-related osteoporosis without current pathological fracture: Secondary | ICD-10-CM | POA: Diagnosis not present

## 2015-07-04 DIAGNOSIS — I4891 Unspecified atrial fibrillation: Secondary | ICD-10-CM | POA: Diagnosis not present

## 2015-07-15 ENCOUNTER — Inpatient Hospital Stay (HOSPITAL_COMMUNITY)
Admission: EM | Admit: 2015-07-15 | Discharge: 2015-07-18 | DRG: 690 | Disposition: A | Discharge status: Skilled Nursing Facility | Payer: Medicare Other | Attending: Internal Medicine | Admitting: Internal Medicine

## 2015-07-15 ENCOUNTER — Emergency Department (HOSPITAL_COMMUNITY): Payer: Medicare Other

## 2015-07-15 ENCOUNTER — Encounter (HOSPITAL_COMMUNITY): Payer: Self-pay | Admitting: Emergency Medicine

## 2015-07-15 DIAGNOSIS — I10 Essential (primary) hypertension: Secondary | ICD-10-CM | POA: Diagnosis not present

## 2015-07-15 DIAGNOSIS — R1310 Dysphagia, unspecified: Secondary | ICD-10-CM | POA: Diagnosis present

## 2015-07-15 DIAGNOSIS — E038 Other specified hypothyroidism: Secondary | ICD-10-CM | POA: Diagnosis not present

## 2015-07-15 DIAGNOSIS — R531 Weakness: Secondary | ICD-10-CM | POA: Diagnosis not present

## 2015-07-15 DIAGNOSIS — M1990 Unspecified osteoarthritis, unspecified site: Secondary | ICD-10-CM | POA: Diagnosis present

## 2015-07-15 DIAGNOSIS — R404 Transient alteration of awareness: Secondary | ICD-10-CM | POA: Diagnosis not present

## 2015-07-15 DIAGNOSIS — Z8249 Family history of ischemic heart disease and other diseases of the circulatory system: Secondary | ICD-10-CM

## 2015-07-15 DIAGNOSIS — R278 Other lack of coordination: Secondary | ICD-10-CM | POA: Diagnosis not present

## 2015-07-15 DIAGNOSIS — I480 Paroxysmal atrial fibrillation: Secondary | ICD-10-CM | POA: Diagnosis not present

## 2015-07-15 DIAGNOSIS — F0390 Unspecified dementia without behavioral disturbance: Secondary | ICD-10-CM | POA: Diagnosis present

## 2015-07-15 DIAGNOSIS — I4891 Unspecified atrial fibrillation: Secondary | ICD-10-CM | POA: Diagnosis present

## 2015-07-15 DIAGNOSIS — Z7901 Long term (current) use of anticoagulants: Secondary | ICD-10-CM | POA: Diagnosis not present

## 2015-07-15 DIAGNOSIS — E860 Dehydration: Secondary | ICD-10-CM | POA: Diagnosis present

## 2015-07-15 DIAGNOSIS — D509 Iron deficiency anemia, unspecified: Secondary | ICD-10-CM | POA: Diagnosis not present

## 2015-07-15 DIAGNOSIS — I482 Chronic atrial fibrillation, unspecified: Secondary | ICD-10-CM | POA: Diagnosis present

## 2015-07-15 DIAGNOSIS — E785 Hyperlipidemia, unspecified: Secondary | ICD-10-CM | POA: Diagnosis not present

## 2015-07-15 DIAGNOSIS — M810 Age-related osteoporosis without current pathological fracture: Secondary | ICD-10-CM | POA: Diagnosis not present

## 2015-07-15 DIAGNOSIS — Z79899 Other long term (current) drug therapy: Secondary | ICD-10-CM

## 2015-07-15 DIAGNOSIS — R269 Unspecified abnormalities of gait and mobility: Secondary | ICD-10-CM | POA: Diagnosis not present

## 2015-07-15 DIAGNOSIS — R1312 Dysphagia, oropharyngeal phase: Secondary | ICD-10-CM | POA: Diagnosis not present

## 2015-07-15 DIAGNOSIS — R410 Disorientation, unspecified: Secondary | ICD-10-CM | POA: Diagnosis not present

## 2015-07-15 DIAGNOSIS — N390 Urinary tract infection, site not specified: Secondary | ICD-10-CM | POA: Diagnosis not present

## 2015-07-15 DIAGNOSIS — E039 Hypothyroidism, unspecified: Secondary | ICD-10-CM | POA: Diagnosis present

## 2015-07-15 DIAGNOSIS — R2689 Other abnormalities of gait and mobility: Secondary | ICD-10-CM | POA: Diagnosis not present

## 2015-07-15 DIAGNOSIS — N3090 Cystitis, unspecified without hematuria: Secondary | ICD-10-CM

## 2015-07-15 DIAGNOSIS — H40053 Ocular hypertension, bilateral: Secondary | ICD-10-CM | POA: Diagnosis not present

## 2015-07-15 DIAGNOSIS — M6281 Muscle weakness (generalized): Secondary | ICD-10-CM | POA: Diagnosis not present

## 2015-07-15 LAB — CBC WITH DIFFERENTIAL/PLATELET
Basophils Absolute: 0 10*3/uL (ref 0.0–0.1)
Basophils Relative: 0 %
EOS PCT: 1 %
Eosinophils Absolute: 0.1 10*3/uL (ref 0.0–0.7)
HEMATOCRIT: 38.7 % (ref 36.0–46.0)
HEMOGLOBIN: 13.1 g/dL (ref 12.0–15.0)
LYMPHS ABS: 0.9 10*3/uL (ref 0.7–4.0)
LYMPHS PCT: 21 %
MCH: 29.4 pg (ref 26.0–34.0)
MCHC: 33.9 g/dL (ref 30.0–36.0)
MCV: 86.8 fL (ref 78.0–100.0)
Monocytes Absolute: 0.5 10*3/uL (ref 0.1–1.0)
Monocytes Relative: 11 %
NEUTROS ABS: 2.8 10*3/uL (ref 1.7–7.7)
Neutrophils Relative %: 67 %
PLATELETS: 225 10*3/uL (ref 150–400)
RBC: 4.46 MIL/uL (ref 3.87–5.11)
RDW: 16.3 % — ABNORMAL HIGH (ref 11.5–15.5)
WBC: 4.2 10*3/uL (ref 4.0–10.5)

## 2015-07-15 LAB — URINALYSIS, ROUTINE W REFLEX MICROSCOPIC
BILIRUBIN URINE: NEGATIVE
GLUCOSE, UA: NEGATIVE mg/dL
Hgb urine dipstick: NEGATIVE
KETONES UR: NEGATIVE mg/dL
Nitrite: NEGATIVE
PH: 7 (ref 5.0–8.0)
Protein, ur: NEGATIVE mg/dL
SPECIFIC GRAVITY, URINE: 1.011 (ref 1.005–1.030)

## 2015-07-15 LAB — COMPREHENSIVE METABOLIC PANEL
ALK PHOS: 92 U/L (ref 38–126)
ALT: 41 U/L (ref 14–54)
AST: 37 U/L (ref 15–41)
Albumin: 3.8 g/dL (ref 3.5–5.0)
Anion gap: 9 (ref 5–15)
BILIRUBIN TOTAL: 0.7 mg/dL (ref 0.3–1.2)
BUN: 26 mg/dL — ABNORMAL HIGH (ref 6–20)
CALCIUM: 9.6 mg/dL (ref 8.9–10.3)
CO2: 28 mmol/L (ref 22–32)
CREATININE: 1.06 mg/dL — AB (ref 0.44–1.00)
Chloride: 102 mmol/L (ref 101–111)
GFR, EST AFRICAN AMERICAN: 51 mL/min — AB (ref >60)
GFR, EST NON AFRICAN AMERICAN: 44 mL/min — AB (ref >60)
Glucose, Bld: 99 mg/dL (ref 65–99)
Potassium: 4.1 mmol/L (ref 3.5–5.1)
Sodium: 139 mmol/L (ref 135–145)
Total Protein: 7.4 g/dL (ref 6.5–8.1)

## 2015-07-15 LAB — URINE MICROSCOPIC-ADD ON

## 2015-07-15 LAB — TROPONIN I: Troponin I: <0.03 ng/mL (ref <0.031)

## 2015-07-15 LAB — PROTIME-INR
INR: 1.11 (ref 0.00–1.49)
PROTHROMBIN TIME: 14.5 s (ref 11.6–15.2)

## 2015-07-15 LAB — MAGNESIUM: Magnesium: 2 mg/dL (ref 1.7–2.4)

## 2015-07-15 MED ORDER — COQ10 200 MG PO CAPS: 200.0000 mg | ORAL_CAPSULE | Freq: Every day | ORAL | Status: DC

## 2015-07-15 MED ORDER — ALENDRONATE SODIUM 70 MG PO TABS: 70.0000 mg | ORAL_TABLET | ORAL | Status: DC

## 2015-07-15 MED ORDER — DONEPEZIL HCL 10 MG PO TABS
10.0000 mg | ORAL_TABLET | Freq: Every day | ORAL | Status: DC
  Administered 2015-07-15 – 2015-07-17 (×3): 10 mg via ORAL
  Filled 2015-07-15 (×4): qty 1

## 2015-07-15 MED ORDER — APIXABAN 2.5 MG PO TABS
2.5000 mg | ORAL_TABLET | Freq: Two times a day (BID) | ORAL | Status: DC
  Administered 2015-07-15 – 2015-07-18 (×7): 2.5 mg via ORAL
  Filled 2015-07-15 (×8): qty 1

## 2015-07-15 MED ORDER — LEVOTHYROXINE SODIUM 25 MCG PO TABS
25.0000 ug | ORAL_TABLET | Freq: Every day | ORAL | Status: DC
  Administered 2015-07-16 – 2015-07-18 (×3): 25 ug via ORAL
  Filled 2015-07-15 (×4): qty 1

## 2015-07-15 MED ORDER — LATANOPROST 0.005 % OP SOLN
1.0000 [drp] | Freq: Every day | OPHTHALMIC | Status: DC
  Administered 2015-07-15 – 2015-07-17 (×3): 1 [drp] via OPHTHALMIC
  Filled 2015-07-15: qty 2.5

## 2015-07-15 MED ORDER — DEXTROSE 5 % IV SOLN
1.0000 g | Freq: Once | INTRAVENOUS | Status: AC
  Administered 2015-07-15: 1 g via INTRAVENOUS
  Filled 2015-07-15: qty 10

## 2015-07-15 MED ORDER — OMEGA-3-ACID ETHYL ESTERS 1 G PO CAPS
1.0000 g | ORAL_CAPSULE | Freq: Every day | ORAL | Status: DC
  Administered 2015-07-15 – 2015-07-18 (×4): 1 g via ORAL
  Filled 2015-07-15 (×4): qty 1

## 2015-07-15 MED ORDER — SODIUM CHLORIDE 0.9 % IV SOLN
INTRAVENOUS | Status: DC
  Administered 2015-07-15 – 2015-07-17 (×3): via INTRAVENOUS

## 2015-07-15 MED ORDER — ONDANSETRON HCL 4 MG/2ML IJ SOLN: 4.0000 mg | Freq: Four times a day (QID) | INTRAMUSCULAR | Status: DC | PRN

## 2015-07-15 MED ORDER — DEXTROSE 5 % IV SOLN
1.0000 g | INTRAVENOUS | Status: DC
  Administered 2015-07-16 – 2015-07-18 (×3): 1 g via INTRAVENOUS
  Filled 2015-07-15 (×3): qty 10

## 2015-07-15 MED ORDER — SODIUM CHLORIDE 0.9 % IV BOLUS (SEPSIS)
1000.0000 mL | Freq: Once | INTRAVENOUS | Status: AC
  Administered 2015-07-15: 1000 mL via INTRAVENOUS

## 2015-07-15 MED ORDER — ACETAMINOPHEN 325 MG PO TABS
650.0000 mg | ORAL_TABLET | Freq: Four times a day (QID) | ORAL | Status: DC | PRN
  Administered 2015-07-17: 650 mg via ORAL

## 2015-07-15 MED ORDER — MEMANTINE HCL 10 MG PO TABS
10.0000 mg | ORAL_TABLET | Freq: Every day | ORAL | Status: DC
  Administered 2015-07-15 – 2015-07-17 (×3): 10 mg via ORAL
  Filled 2015-07-15 (×4): qty 1

## 2015-07-15 MED ORDER — VITAMIN D 1000 UNITS PO TABS
2000.0000 [IU] | ORAL_TABLET | Freq: Two times a day (BID) | ORAL | Status: DC
  Administered 2015-07-15 – 2015-07-18 (×5): 2000 [IU] via ORAL
  Filled 2015-07-15 (×7): qty 2

## 2015-07-15 MED ORDER — LORATADINE 10 MG PO TABS
10.0000 mg | ORAL_TABLET | Freq: Every day | ORAL | Status: DC
  Administered 2015-07-15 – 2015-07-17 (×3): 10 mg via ORAL
  Filled 2015-07-15 (×4): qty 1

## 2015-07-15 MED ORDER — ONDANSETRON HCL 4 MG PO TABS: 4.0000 mg | ORAL_TABLET | Freq: Four times a day (QID) | ORAL | Status: DC | PRN

## 2015-07-15 MED ORDER — ACETAMINOPHEN 650 MG RE SUPP: 650.0000 mg | Freq: Four times a day (QID) | RECTAL | Status: DC | PRN

## 2015-07-15 MED ORDER — VITAMIN C 500 MG PO TABS
1000.0000 mg | ORAL_TABLET | Freq: Two times a day (BID) | ORAL | Status: DC
  Administered 2015-07-15 – 2015-07-18 (×5): 1000 mg via ORAL
  Filled 2015-07-15 (×7): qty 2

## 2015-07-15 MED ORDER — AMLODIPINE BESYLATE 10 MG PO TABS
10.0000 mg | ORAL_TABLET | Freq: Every day | ORAL | Status: DC
  Administered 2015-07-15 – 2015-07-17 (×3): 10 mg via ORAL
  Filled 2015-07-15 (×4): qty 1

## 2015-07-15 MED ORDER — POLYSACCHARIDE IRON COMPLEX 150 MG PO CAPS
150.0000 mg | ORAL_CAPSULE | Freq: Every day | ORAL | Status: DC
  Administered 2015-07-15 – 2015-07-18 (×4): 150 mg via ORAL
  Filled 2015-07-15 (×4): qty 1

## 2015-07-15 NOTE — H&P (Signed)
[facility] History and Physical  Katherine Miranda [id number] DOB: August 30, 1920 DOA: 07/15/2015  Referring physician: ER physician: Dr. Varney Biles  PCP: [name], MD  Chief Complaint: weakness   HPI:  82 year old female with past medical history of hypertension, atrial fibrillation on AC with ELiquis, hypothyroidism, dyslipidemia, dementia who presented to [facility] ED with more lethargy and weakness as reported by her daughter at the bedside. Apparently she is usually able to get up, usually has normal speech but with signs of dementia but over a day or so her daughter noticed she was moving slower and was actually more in bed and had slower speech. She also seemed more confused. No reports of shortness of breath, chest pain, palpitations. Pt herself is not a good historian due to dementia. No reports of fevers. No diarrhea or constipation. No blood in stool or urine.  In ED. BP was 122/52, HR 63, T max 97.6 F, oxygen saturation 92-99% on room air. Blood work showed slight elevation in Cr 1.0 (baseline 1.33).  UA showed moderate leukocytes and many bacteria. She was started on rocephin empirically. CT head showed stable findings.   Assessment & Plan    Active Problems: Generalized weakness - Likely due to combination of dementia, UTI - Obtain PT eval in am - Supportive care for now  UTI (lower urinary tract infection) - Moderate leukocytes on UA and many bacteria - Started rocephin - F/U urine culture results  Atrial fibrillation - CHADS vasc score 3 - On AC with eliquis - Rate controlled without beta blockers  Dyslipidemia - Continue omega 3  Hypertension, essential - Continue Norvasc   Dementia without behavioral disturbance - Continue donepezil and namenda   Hypothyroidism - Continue synthroid - Will check TSH   DVT prophylaxis:  - She is on Eliquis   Radiological Exams on Admission: No results found.   Code Status: Full Family Communication:  Plan of care discussed with the patient's daughter at the bedside  Disposition Plan: Admit for further evaluation, medical floor   Leisa Lenz, MD  [facility] Pager [phone]  Time spent in minutes: 75 minutes  Review of Systems:  Unable to obtain due to patient's dementia   Past Medical History  Diagnosis Date  . Hypertension   . Dementia   . Arthritis   . A-fib [facility])    Past Surgical History  Procedure Laterality Date  . Knee arthroscopy    . Back surgery    . Cardiac catheterization N/A 04/20/2015    Procedure: Temporary Pacemaker;  Surgeon: Lorretta Harp, MD;  Location: [facility];  Service: Cardiovascular;  Laterality: N/A;   Social History:  reports that she has never smoked. She has never used smokeless tobacco. She reports that she does not drink alcohol or use illicit drugs.  No Known Allergies  Family History: hypertension in family; her daughter has no significant PMH, not on any meds   Prior to Admission medications   Medication Sig Start Date End Date Taking? Authorizing Provider  alendronate (FOSAMAX) 70 MG tablet Take 70 mg by mouth every Tuesday. Take with a full glass of water on an empty stomach.   Yes Historical Provider, MD  amLODipine (NORVASC) 10 MG tablet Take 10 mg by mouth at bedtime.   Yes Historical Provider, MD  Ascorbic Acid (VITAMIN C) 1000 MG tablet Take 1,000 mg by mouth 2 (two) times daily.    Yes Historical Provider, MD  CALCIUM PO Take 1 tablet by mouth  2 (two) times daily.    Yes Historical Provider, MD  Cholecalciferol (VITAMIN D) 2000 UNITS tablet Take 2,000 Units by mouth 2 (two) times daily.   Yes Historical Provider, MD  Coenzyme Q10 (COQ10) 200 MG CAPS Take 200 mg by mouth daily.   Yes Historical Provider, MD  CRANBERRY PO Take 300 mg by mouth daily.    Yes Historical Provider, MD  donepezil (ARICEPT) 10 MG tablet Take 10 mg by mouth at bedtime.   Yes Historical Provider, MD  ELIQUIS 2.5 MG TABS tablet TAKE 1  TABLET TWICE A DAY 12/28/14  Yes Evans Lance, MD  iron polysaccharides (NIFEREX) 150 MG capsule Take 150 mg by mouth daily.   Yes Historical Provider, MD  levothyroxine (SYNTHROID, LEVOTHROID) 25 MCG tablet Take 1 tablet (25 mcg total) by mouth daily before breakfast. 04/24/15  Yes Ripudeep K Rai, MD  loratadine (CLARITIN) 10 MG tablet Take 10 mg by mouth at bedtime.   Yes Historical Provider, MD  memantine (NAMENDA) 10 MG tablet Take 10 mg by mouth at bedtime.    Yes Historical Provider, MD  Omega-3 Fatty Acids (FISH OIL) 1000 MG CAPS Take 1,000 mg by mouth daily.   Yes Historical Provider, MD  Travoprost, BAK Free, (TRAVATAN) 0.004 % SOLN ophthalmic solution Place 1 drop into both eyes at bedtime.   Yes Historical Provider, MD  cephALEXin (KEFLEX) 250 MG capsule Take 1 capsule (250 mg total) by mouth 2 (two) times daily. 04/24/15   Ripudeep Krystal Eaton, MD  Maltodextrin-Xanthan Gum [facility] CLEAR) POWD NECTAR THICK LIQUIDS 04/24/15   Mendel Corning, MD   Physical Exam: Filed Vitals:   07/15/15 0951 07/15/15 1109 07/15/15 1228 07/15/15 1456  BP: 140/65 125/52 154/68 145/86  Pulse: 69 81 83 76  Temp:   97.5 F (36.4 C) 97.5 F (36.4 C)  TempSrc:   Oral Oral  Resp: 16 16 16 18   SpO2: 92% 94% 92% 94%    Physical Exam  Constitutional: Appears well-developed and well-nourished. No distress.  HENT: Normocephalic. No tonsillar erythema or exudates Eyes: Conjunctivae are normal. No scleral icterus.  Neck: Normal ROM. Neck supple. No JVD. No tracheal deviation. No thyromegaly.  CVS: RRR, S1, S2 (+) Pulmonary: Effort and breath sounds normal, no stridor, rhonchi, wheezes, rales.  Abdominal: Soft. BS +,  no distension, tenderness, rebound or guarding.  Musculoskeletal: Normal range of motion. No edema and no tenderness.  Lymphadenopathy: No lymphadenopathy noted, cervical, inguinal. Neuro: Alert. No focal neurologic deficits. Skin: Skin is warm and dry. Ecchymoses scattered on LE and  UE Psychiatric: Normal mood and affect. Behavior, judgment, thought content normal.   Labs on Admission:  Basic Metabolic Panel:  Recent Labs Lab 07/15/15 1002  NA 139  K 4.1  CL 102  CO2 28  GLUCOSE 99  BUN 26*  CREATININE 1.06*  CALCIUM 9.6  MG 2.0   Liver Function Tests:  Recent Labs Lab 07/15/15 1002  AST 37  ALT 41  ALKPHOS 92  BILITOT 0.7  PROT 7.4  ALBUMIN 3.8   No results for input(s): LIPASE, AMYLASE in the last 168 hours. No results for input(s): AMMONIA in the last 168 hours. CBC:  Recent Labs Lab 07/15/15 1002  WBC 4.2  NEUTROABS 2.8  HGB 13.1  HCT 38.7  MCV 86.8  PLT 225   Cardiac Enzymes:  Recent Labs Lab 07/15/15 1002  TROPONINI <0.03   BNP: Invalid input(s): POCBNP CBG: No results for input(s): GLUCAP in the last 168  hours.  If 7PM-7AM, please contact night-coverage [email or web address] Password [facility] 07/15/2015, 3:05 PM

## 2015-07-15 NOTE — Progress Notes (Signed)
PHARMACIST - PHYSICIAN ORDER COMMUNICATION  CONCERNING: P&T Medication Policy on Herbal Medications  DESCRIPTION:  This patient's order for: CoEnzyme Q10  has been noted.  This product(s) is classified as an "herbal" or natural product. Due to a lack of definitive safety studies or FDA approval, nonstandard manufacturing practices, plus the potential risk of unknown drug-drug interactions while on inpatient medications, the Pharmacy and Therapeutics Committee does not permit the use of "herbal" or natural products of this type within [facility].   ACTION TAKEN: The pharmacy department is unable to verify this order at this time and your patient has been informed of this safety policy. Please reevaluate patient's clinical condition at discharge and address if the herbal or natural product(s) should be resumed at that time.   PHARMACIST - PHYSICIAN COMMUNICATION  CONCERNING: P&T Medication Policy Regarding Oral Bisphosphonates  RECOMMENDATION: Your order for alendronate (Fosamax), ibandronate (Boniva), or risedronate (Actonel) has been discontinued at this time.  If the patient's post-hospital medical condition warrants safe use of this class of drugs, please resume the pre-hospital regimen upon discharge.  DESCRIPTION:  Alendronate (Fosamax), ibandronate (Boniva), and risedronate (Actonel) can cause severe esophageal erosions in patients who are unable to remain upright at least 30 minutes after taking this medication.   Since brief interruptions in therapy are thought to have minimal impact on bone mineral density, the [place] has established that bisphosphonate orders should be routinely discontinued during hospitalization.   To override this safety policy and permit administration of Boniva, Fosamax, or Actonel in the hospital, prescribers must write "DO NOT HOLD" in the comments section when placing the order for this class of medications.  Netta Cedars, PharmD, BCPS Pager: [phone] 07/15/2015@4 :59 PM

## 2015-07-15 NOTE — ED Notes (Signed)
Attempted to ambulate patient. Patient was unsteady and could not get her balance enough to walk. Pt had a standing blood pressure of 143/90.

## 2015-07-15 NOTE — ED Notes (Signed)
Per EMS pt from home with complaint of bilateral leg weakness for a week; denies pain.

## 2015-07-15 NOTE — ED Notes (Signed)
Bed: [id number] Expected date:  Expected time:  Means of arrival:  Comments: EMS- 82yo F, weakness

## 2015-07-15 NOTE — ED Provider Notes (Addendum)
CSN: [id number]     Arrival date & time 07/15/15  [id number] History   First MD Initiated Contact with Patient 07/15/15 [id number]     Chief Complaint  Patient presents with  . Extremity Weakness     (Consider location/radiation/quality/duration/timing/severity/associated sxs/prior Treatment) HPI Comments: She  has a past medical history of Hypertension; Dementia; Arthritis; and A-fib ([place]), normal pressure hydrocephalus. Pt comes in with weakness and altered mental status. Pt brought in by daughter and provides the hx as patient has dementia. Pt has no complains. Daughter reports however that since Friday pt has had increased weakness and some increased confusion. Pt has been slower with response, and appears a bit "foggy." Pt also has been having more difficulty walking, she uses a walker. Daughter reports that there is increased clamminess today and yday. Pt denies nausea, emesis, fevers, chills, chest pains, shortness of breath, headaches, abdominal pain, uti like symptoms. No new meds besides Thyroid meds.  In Nov pt was admitted with weakness and bradycardia. Was noted to have normal pressure hydrocephalus and was taken off of some meds.   ROS 10 Systems reviewed and are negative for acute change except as noted in the HPI.       Patient is a 83 y.o. female presenting with extremity weakness. The history is provided by a relative and medical records.  Extremity Weakness    Past Medical History  Diagnosis Date  . Hypertension   . Dementia   . Arthritis   . A-fib [employer])    Past Surgical History  Procedure Laterality Date  . Knee arthroscopy    . Back surgery    . Cardiac catheterization N/A 04/20/2015    Procedure: Temporary Pacemaker;  Surgeon: Lorretta Harp, MD;  Location: [facility];  Service: Cardiovascular;  Laterality: N/A;   No family history on file. Social History  Substance Use Topics  . Smoking status: Never Smoker   . Smokeless tobacco: Never Used  .  Alcohol Use: No   OB History    No data available     Review of Systems  Musculoskeletal: Positive for extremity weakness.      Allergies  Review of patient's allergies indicates no known allergies.  Home Medications   Prior to Admission medications   Medication Sig Start Date End Date Taking? Authorizing Provider  alendronate (FOSAMAX) 70 MG tablet Take 70 mg by mouth every Tuesday. Take with a full glass of water on an empty stomach.   Yes Historical Provider, MD  amLODipine (NORVASC) 10 MG tablet Take 10 mg by mouth at bedtime.   Yes Historical Provider, MD  Ascorbic Acid (VITAMIN C) 1000 MG tablet Take 1,000 mg by mouth 2 (two) times daily.    Yes Historical Provider, MD  CALCIUM PO Take 1 tablet by mouth 2 (two) times daily.    Yes Historical Provider, MD  Cholecalciferol (VITAMIN D) 2000 UNITS tablet Take 2,000 Units by mouth 2 (two) times daily.   Yes Historical Provider, MD  Coenzyme Q10 (COQ10) 200 MG CAPS Take 200 mg by mouth daily.   Yes Historical Provider, MD  CRANBERRY PO Take 300 mg by mouth daily.    Yes Historical Provider, MD  donepezil (ARICEPT) 10 MG tablet Take 10 mg by mouth at bedtime.   Yes Historical Provider, MD  ELIQUIS 2.5 MG TABS tablet TAKE 1 TABLET TWICE A DAY 12/28/14  Yes Evans Lance, MD  iron polysaccharides (NIFEREX) 150 MG capsule Take 150 mg by mouth daily.  Yes Historical Provider, MD  levothyroxine (SYNTHROID, LEVOTHROID) 25 MCG tablet Take 1 tablet (25 mcg total) by mouth daily before breakfast. 04/24/15  Yes Ripudeep K Rai, MD  loratadine (CLARITIN) 10 MG tablet Take 10 mg by mouth at bedtime.   Yes Historical Provider, MD  memantine (NAMENDA) 10 MG tablet Take 10 mg by mouth at bedtime.    Yes Historical Provider, MD  Omega-3 Fatty Acids (FISH OIL) 1000 MG CAPS Take 1,000 mg by mouth daily.   Yes Historical Provider, MD  Travoprost, BAK Free, (TRAVATAN) 0.004 % SOLN ophthalmic solution Place 1 drop into both eyes at bedtime.   Yes  Historical Provider, MD  Maltodextrin-Xanthan Gum (RESOURCE THICKENUP CLEAR) POWD NECTAR THICK LIQUIDS 04/24/15   Ripudeep Krystal Eaton, MD  mupirocin ointment (BACTROBAN) 2 % Place 1 application into the nose 2 (two) times daily. 07/18/15   Lavina Hamman, MD   BP 145/57 mmHg  Pulse 78  Temp(Src) 97.4 F (36.3 C) (Oral)  Resp 20  Ht 5\' 4"  (1.626 m)  Wt 152 lb (68.947 kg)  BMI 26.08 kg/m2  SpO2 96% Physical Exam  Constitutional: She is oriented to person, place, and time. She appears well-developed.  HENT:  Head: Normocephalic and atraumatic.  Eyes: EOM are normal.  Neck: Normal range of motion. Neck supple.  Cardiovascular: Normal rate.   Pulmonary/Chest: Effort normal.  Abdominal: Bowel sounds are normal.  Neurological: She is alert and oriented to person, place, and time.  Confused, unsteady gait  Skin: Skin is warm and dry.  Nursing note and vitals reviewed.   ED Course  Procedures (including critical care time) Labs Review Labs Reviewed  MRSA PCR SCREENING - Abnormal; Notable for the following:    MRSA by PCR POSITIVE (*)    All other components within normal limits  CBC WITH DIFFERENTIAL/PLATELET - Abnormal; Notable for the following:    RDW 16.3 (*)    All other components within normal limits  COMPREHENSIVE METABOLIC PANEL - Abnormal; Notable for the following:    BUN 26 (*)    Creatinine, Ser 1.06 (*)    GFR calc non Af Amer 44 (*)    GFR calc Af Amer 51 (*)    All other components within normal limits  URINALYSIS, ROUTINE W REFLEX MICROSCOPIC (NOT AT [facility]) - Abnormal; Notable for the following:    Leukocytes, UA MODERATE (*)    All other components within normal limits  URINE MICROSCOPIC-ADD ON - Abnormal; Notable for the following:    Squamous Epithelial / LPF 0-5 (*)    Bacteria, UA MANY (*)    All other components within normal limits  BASIC METABOLIC PANEL - Abnormal; Notable for the following:    BUN 21 (*)    Calcium 8.7 (*)    GFR calc non Af Amer 55 (*)     All other components within normal limits  CBC - Abnormal; Notable for the following:    WBC 3.7 (*)    RDW 16.7 (*)    All other components within normal limits  TSH - Abnormal; Notable for the following:    TSH 5.652 (*)    All other components within normal limits  GLUCOSE, CAPILLARY - Abnormal; Notable for the following:    Glucose-Capillary 106 (*)    All other components within normal limits  CBC WITH DIFFERENTIAL/PLATELET - Abnormal; Notable for the following:    RDW 16.2 (*)    All other components within normal limits  BASIC METABOLIC PANEL -  Abnormal; Notable for the following:    Glucose, Bld 104 (*)    Calcium 8.8 (*)    All other components within normal limits  GLUCOSE, CAPILLARY - Abnormal; Notable for the following:    Glucose-Capillary 108 (*)    All other components within normal limits  URINE CULTURE  PROTIME-INR  TROPONIN I  MAGNESIUM  GLUCOSE, CAPILLARY    Imaging Review No results found. I have personally reviewed and evaluated these images and lab results as part of my medical decision-making.   EKG Interpretation   Date/Time:  Monday July 15 2015 09:37:41 EST Ventricular Rate:  72 PR Interval:  187 QRS Duration: 106 QT Interval:  465 QTC Calculation: 509 R Axis:   -132 Text Interpretation:  Right and left arm electrode reversal,  interpretation assumes no reversal Sinus or ectopic atrial rhythm Anterior  infarct, old Artifact in lead(s) I II III aVR aVL aVF V2 no acute changes  suspected Confirmed by Rumaysa Sabatino, MD, Thelma Comp [id number]) on 07/15/2015 11:50:52  AM      MDM   Final diagnoses:  Cystitis  UTI (lower urinary tract infection)  Generalized weakness    Pt comes in with weakness, altered sensorium.  Hx of NPH per previous neuro note. She has 0 SIRs at arrival - but UA appears +. We will admit for optimization.     Varney Biles, MD 07/15/15 [id number]  Varney Biles, MD 08/09/15 1510

## 2015-07-15 NOTE — ED Notes (Signed)
Will ambulate pt post rocephin completion.

## 2015-07-16 DIAGNOSIS — I482 Chronic atrial fibrillation: Secondary | ICD-10-CM

## 2015-07-16 LAB — BASIC METABOLIC PANEL
ANION GAP: 9 (ref 5–15)
BUN: 21 mg/dL — AB (ref 6–20)
CALCIUM: 8.7 mg/dL — AB (ref 8.9–10.3)
CHLORIDE: 105 mmol/L (ref 101–111)
CO2: 26 mmol/L (ref 22–32)
CREATININE: 0.88 mg/dL (ref 0.44–1.00)
GFR calc Af Amer: >60 mL/min (ref >60)
GFR, EST NON AFRICAN AMERICAN: 55 mL/min — AB (ref >60)
Glucose, Bld: 92 mg/dL (ref 65–99)
POTASSIUM: 4.3 mmol/L (ref 3.5–5.1)
Sodium: 140 mmol/L (ref 135–145)

## 2015-07-16 LAB — CBC
HCT: 37.5 % (ref 36.0–46.0)
Hemoglobin: 12.3 g/dL (ref 12.0–15.0)
MCH: 29.6 pg (ref 26.0–34.0)
MCHC: 32.8 g/dL (ref 30.0–36.0)
MCV: 90.1 fL (ref 78.0–100.0)
PLATELETS: 223 10*3/uL (ref 150–400)
RBC: 4.16 MIL/uL (ref 3.87–5.11)
RDW: 16.7 % — AB (ref 11.5–15.5)
WBC: 3.7 10*3/uL — AB (ref 4.0–10.5)

## 2015-07-16 LAB — TSH: TSH: 5.652 u[IU]/mL — AB (ref 0.350–4.500)

## 2015-07-16 LAB — URINE CULTURE: Culture: 7000

## 2015-07-16 LAB — GLUCOSE, CAPILLARY: GLUCOSE-CAPILLARY: 85 mg/dL (ref 65–99)

## 2015-07-16 LAB — MRSA PCR SCREENING: MRSA by PCR: POSITIVE — AB

## 2015-07-16 MED ORDER — MUPIROCIN 2 % EX OINT
1.0000 "application " | TOPICAL_OINTMENT | Freq: Two times a day (BID) | CUTANEOUS | Status: DC
  Administered 2015-07-16 – 2015-07-18 (×5): 1 via NASAL
  Filled 2015-07-16: qty 22

## 2015-07-16 MED ORDER — CHLORHEXIDINE GLUCONATE CLOTH 2 % EX PADS
6.0000 | MEDICATED_PAD | Freq: Every day | CUTANEOUS | Status: DC
  Administered 2015-07-16 – 2015-07-18 (×3): 6 via TOPICAL

## 2015-07-16 NOTE — Progress Notes (Addendum)
Patient ID: Katherine Miranda, female   DOB: 10/03/1920, 82 y.o.   MRN: [id number] [facility] PROGRESS NOTE  ALBANA SPIEKER [id number] DOB: 1921-06-01 DOA: 07/15/2015 PCP: [name], MD  Brief narrative:    82 year old email dementia who presented to [facility] ED with more lethargy and weakness. She was hemodynamically stable at the time of the admission. CT head did not show new acute findings. She was started on Rocephin for possible urinary tract infection.   Assessment/Plan:    Active Problems: Generalized weakness - Likely due to combination of dementia, UTI - Supportive care for now - Treat UTI with Rocephin  UTI (lower urinary tract infection) - Moderate leukocytes on UA and many bacteria - Urine culture pending - Continue Rocephin  Atrial fibrillation - CHADS vasc score 3 - On AC with eliquis - Rate controlled without beta blockers  Dyslipidemia - Continue omega 3  Hypertension, essential - Continue Norvasc   Dementia without behavioral disturbance - Continue donepezil and namenda - Stable  Hypothyroidism - Continue synthroid - TSH is pending   DVT Prophylaxis  - Patient takes Apixaban    Code Status: Full.  Family Communication:  plan of care discussed with the patient's daughter at the time of the admission and today Disposition Plan: Home likely by 05/16/2016  IV access:  Peripheral IV  Procedures and diagnostic studies:    Ct Head Wo Contrast 07/15/2015  1.  No evidence of acute intracranial abnormality. 2. Stable generalized cerebral volume loss, intracranial atherosclerosis and chronic small vessel ischemic white matter change.   Medical Consultants:  None  IAnti-Infectives:   Rocephin 07/15/2015 -->   Leisa Lenz, MD  [facility] Pager [phone]  Time spent in minutes: 25 minutes  If 7PM-7AM, please contact night-coverage [email or web address] Password [facility] 07/16/2015, 12:18 PM   LOS: 1 day    HPI/Subjective: No  acute overnight events. No respiratory distress. Objective: Filed Vitals:   07/15/15 1456 07/15/15 1601 07/15/15 1639 07/15/15 2137  BP: 145/86 144/74 163/74 151/66  Pulse: 76 69 72 63  Temp: 97.5 F (36.4 C) 97.4 F (36.3 C) 97.6 F (36.4 C) 97.5 F (36.4 C)  TempSrc: Oral Oral Oral Oral  Resp: 18 16 20 19   Height:   5\' 4"  (1.626 m)   Weight:   68.947 kg (152 lb)   SpO2: 94% 92% 97% 98%    Intake/Output Summary (Last 24 hours) at 07/16/15 1218 Last data filed at 07/16/15 0929  Gross per 24 hour  Intake 1803.33 ml  Output      0 ml  Net 1803.33 ml    Exam:   General:  Pt is alert, not in acute distress  Cardiovascular: Regular rate and rhythm, S1/S2 (+)  Respiratory: Clear to auscultation bilaterally, no wheezing, no crackles, no rhonchi  Abdomen: Soft, non tender, non distended, bowel sounds present  Extremities: No edema, pulses DP and PT palpable bilaterally  Neuro: Grossly nonfocal  Data Reviewed: Basic Metabolic Panel:  Recent Labs Lab 07/15/15 1002 07/16/15 0553  NA 139 140  K 4.1 4.3  CL 102 105  CO2 28 26  GLUCOSE 99 92  BUN 26* 21*  CREATININE 1.06* 0.88  CALCIUM 9.6 8.7*  MG 2.0  --    Liver Function Tests:  Recent Labs Lab 07/15/15 1002  AST 37  ALT 41  ALKPHOS 92  BILITOT 0.7  PROT 7.4  ALBUMIN 3.8   No results for input(s): LIPASE, AMYLASE in the last 168 hours. No results for input(s):  AMMONIA in the last 168 hours. CBC:  Recent Labs Lab 07/15/15 1002 07/16/15 0553  WBC 4.2 3.7*  NEUTROABS 2.8  --   HGB 13.1 12.3  HCT 38.7 37.5  MCV 86.8 90.1  PLT 225 223   Cardiac Enzymes:  Recent Labs Lab 07/15/15 1002  TROPONINI <0.03   BNP: Invalid input(s): POCBNP CBG:  Recent Labs Lab 07/16/15 0753  GLUCAP 85    Recent Results (from the past 240 hour(s))  MRSA PCR Screening     Status: Abnormal   Collection Time: 07/16/15  8:30 AM  Result Value Ref Range Status   MRSA by PCR POSITIVE (A) NEGATIVE Final     Comment:        The GeneXpert MRSA Assay (FDA approved for NASAL specimens only), is one component of a comprehensive MRSA colonization surveillance program. It is not intended to diagnose MRSA infection nor to guide or monitor treatment for MRSA infections. RESULT CALLED TO, READ BACK BY AND VERIFIED WITH: CAUDLE,E @ 1047 ON [id number] BY POTEAT,S      Scheduled Meds: . amLODipine  10 mg Oral QHS  . apixaban  2.5 mg Oral Q12H  . cefTRIAXone (ROCEPHIN)  IV  1 g Intravenous Q24H  . Chlorhexidine Gluconate Cloth  6 each Topical Q0600  . cholecalciferol  2,000 Units Oral BID  . donepezil  10 mg Oral QHS  . iron polysaccharides  150 mg Oral Daily  . latanoprost  1 drop Both Eyes QHS  . levothyroxine  25 mcg Oral QAC breakfast  . loratadine  10 mg Oral QHS  . memantine  10 mg Oral QHS  . mupirocin ointment  1 application Nasal BID  . omega-3 acid ethyl esters  1 g Oral Daily  . vitamin C  1,000 mg Oral BID   Continuous Infusions: . sodium chloride 50 mL/hr at 07/16/15 1033

## 2015-07-17 DIAGNOSIS — E785 Hyperlipidemia, unspecified: Secondary | ICD-10-CM

## 2015-07-17 DIAGNOSIS — N390 Urinary tract infection, site not specified: Principal | ICD-10-CM

## 2015-07-17 DIAGNOSIS — I480 Paroxysmal atrial fibrillation: Secondary | ICD-10-CM

## 2015-07-17 DIAGNOSIS — F0390 Unspecified dementia without behavioral disturbance: Secondary | ICD-10-CM

## 2015-07-17 DIAGNOSIS — R1310 Dysphagia, unspecified: Secondary | ICD-10-CM

## 2015-07-17 DIAGNOSIS — I10 Essential (primary) hypertension: Secondary | ICD-10-CM

## 2015-07-17 LAB — GLUCOSE, CAPILLARY: GLUCOSE-CAPILLARY: 106 mg/dL — AB (ref 65–99)

## 2015-07-17 NOTE — Clinical Social Work Note (Signed)
Clinical Social Work Assessment  Patient Details  Name: Katherine Miranda MRN: [id number] Date of Birth: July 27, 1920  Date of referral:  07/17/15               Reason for consult:  Facility Placement                Permission sought to share information with:  Family Supports Permission granted to share information::  Yes, Verbal Permission Granted  Name::      (daughterIzora Miranda)  Agency::     Relationship::     Contact Information:   daughterIzora Miranda [id number] work/ cell [phone]  Housing/Transportation Living arrangements for the past 2 months:  [place] of Information:  Patient, Adult Children Patient Interpreter Needed:  None Criminal Activity/Legal Involvement Pertinent to Current Situation/Hospitalization:  No - Comment as needed Significant Relationships:  Adult Children, Community Support, Friend Lives with:    Do you feel safe going back to the place where you live?  No Need for family participation in patient care:  Yes (Comment)  Care giving concerns:  CSW spoke with patient who was somewhat confused- CSW contacted her daughter who reports her mother was living at home- was at [facility]/SNF in the recent past and she understands she may need to re-visit SNF stay at time of dc. PT is pending.    Social Worker assessment / plan:  Phoebe Perch and [facility] updated for SNF search in case this is needed at dc. Daughter would like to consider [facility] again if needed.  [facility] aware and reports she has 64 co-insurance days left.   Employment status:  Retired Forensic scientist:  Medicare PT Recommendations:  [place] / Referral to community resources:  [place]  Patient/Family's Response to care:  Daughter understands treatment for the uti- she is hopeful this will be treated and her confusion will cleared.   Patient/Family's Understanding of and Emotional Response to Diagnosis, Current Treatment, and Prognosis:   Good/positive  Emotional Assessment Appearance:  Appears older than stated age, Developmentally appropriate Attitude/Demeanor/Rapport:    Affect (typically observed):    Orientation:  Oriented to Self Alcohol / Substance use:    Psych involvement (Current and /or in the community):     Discharge Needs  Concerns to be addressed:  Discharge Planning Concerns Readmission within the last 30 days:  No Current discharge risk:  Physical Impairment Barriers to Discharge:  No Barriers Identified   Ludwig Clarks, LCSW 07/17/2015, 2:02 PM

## 2015-07-17 NOTE — Care Management Note (Signed)
Case Management Note  Patient Details  Name: Katherine Miranda MRN: [id number] Date of Birth: 1921/03/26  Subjective/Objective:  82 y/o f admitted w/UTI.[id number]. Noted has home health aide.From home.PT cons-await recc.                  Action/Plan:d/c plan home.   Expected Discharge Date:   (unknown)               Expected Discharge Plan:  [place]  In-House Referral:  Clinical Social Work  Discharge planning Services  CM Consult  Post Acute Care Choice:    Choice offered to:     DME Arranged:    DME Agency:     HH Arranged:    [place] Agency:     Status of Service:  In process, will continue to follow  Medicare Important Message Given:    Date Medicare IM Given:    Medicare IM give by:    Date Additional Medicare IM Given:    Additional Medicare Important Message give by:     If discussed at [place] of Stay Meetings, dates discussed:    Additional Comments:  Dessa Phi, RN 07/17/2015, 10:08 AM

## 2015-07-17 NOTE — Progress Notes (Signed)
[facility] Progress Note  Patient: Katherine Miranda [id number]   PCP: [name], MD DOB: Aug 29, 1920   DOA: 07/15/2015   DOS: 07/17/2015   Date of Service: the patient was seen and examined on 07/17/2015  Subjective: Does not have any acute complaint. Continues to have been confused. Nutrition: Tolerating oral diet although as per daughter patient has been having increased episodes of coughing while swallowing Activity: Bedridden at present Last BM: 07/16/2015  Assessment and Plan: 1. UTI (lower urinary tract infection) Patient presents with complaints of generalized weakness and fatigue. Patient was admitted for suspected UTI. Urine does not show any evidence of significant growth. Lab work appears stable. We finished 3 day course of antibiotics.  2. Dysphagia.  Last admission the patient was given dysphagia type II diet. At present the daughter is concerned about patient's swallowing. We will reconsult speech therapy. Currently nothing by mouth.  3. dementia. Patient is pleasantly confused no behavioral issues will continue to closely monitor.  4. Atrial fibrillation. Continue to granulation with liquids. Currently rate controlled.  5. essential hypertension. Continuing her home medication.  6. hypothyroidism. Continue Synthroid. TSH significantly better would recommend outpatient recheck before adjusting Synthroid level.  DVT Prophylaxis: on therapeutic anticoagulation. Nutrition: Currently nothing by mouth Advance goals of care discussion: Full code  Brief Summary of Hospitalization:  HPI: As per the H and P dictated on admission, "82 year old female with past medical history of hypertension, atrial fibrillation on AC with ELiquis, hypothyroidism, dyslipidemia, dementia who presented to [facility] ED with more lethargy and weakness as reported by her daughter at the bedside. Apparently she is usually able to get up, usually has normal speech but with signs of dementia  but over a day or so her daughter noticed she was moving slower and was actually more in bed and had slower speech. She also seemed more confused. No reports of shortness of breath, chest pain, palpitations. Pt herself is not a good historian due to dementia. No reports of fevers. No diarrhea or constipation. No blood in stool or urine.  In ED. BP was 122/52, HR 63, T max 97.6 F, oxygen saturation 92-99% on room air. Blood work showed slight elevation in Cr 1.0 (baseline 1.33). UA showed moderate leukocytes and many bacteria. She was started on rocephin empirically. CT head showed stable findings. " Daily update, Procedures: none Consultants: none Antibiotics: Anti-infectives    Start     Dose/Rate Route Frequency Ordered Stop   07/16/15 1200  cefTRIAXone (ROCEPHIN) 1 g in dextrose 5 % 50 mL IVPB     1 g 100 mL/hr over 30 Minutes Intravenous Every 24 hours 07/15/15 1600     07/15/15 1200  cefTRIAXone (ROCEPHIN) 1 g in dextrose 5 % 50 mL IVPB     1 g 100 mL/hr over 30 Minutes Intravenous  Once 07/15/15 1151 07/15/15 1256       Family Communication: no family was present at bedside, at the time of interview.   Disposition:  Expected discharge date: 07/18/2015 Barriers to safe discharge: Urine culture results as well as speech therapy consult   Intake/Output Summary (Last 24 hours) at 07/17/15 1415 Last data filed at 07/17/15 0848  Gross per 24 hour  Intake    470 ml  Output      0 ml  Net    470 ml   Filed Weights   07/15/15 1639  Weight: 68.947 kg (152 lb)    Objective: Physical Exam: Filed Vitals:   07/15/15  2137 07/16/15 1634 07/16/15 2157 07/17/15 0442  BP: 151/66 137/77 151/66 140/51  Pulse: 63 70 72 71  Temp: 97.5 F (36.4 C)  97.7 F (36.5 C) 97.2 F (36.2 C)  TempSrc: Oral  Oral Oral  Resp: 19 18 18 18   Height:      Weight:      SpO2: 98% 97% 95% 95%     General: Appear in mild distress, no Rash; Oral Mucosa moist. Cardiovascular: S1 and S2 Present, no  Murmur, no JVD Respiratory: Bilateral Air entry present and Clear to Auscultation, no Crackles, no wheezes Abdomen: Bowel Sound present, Soft and no tenderness Extremities: no Pedal edema, o calf tenderness Neurology: Grossly no focal neuro deficit.  Data Reviewed: CBC:  Recent Labs Lab 07/15/15 1002 07/16/15 0553  WBC 4.2 3.7*  NEUTROABS 2.8  --   HGB 13.1 12.3  HCT 38.7 37.5  MCV 86.8 90.1  PLT 225 [id number]   Basic Metabolic Panel:  Recent Labs Lab 07/15/15 1002 07/16/15 0553  NA 139 140  K 4.1 4.3  CL 102 105  CO2 28 26  GLUCOSE 99 92  BUN 26* 21*  CREATININE 1.06* 0.88  CALCIUM 9.6 8.7*  MG 2.0  --    Liver Function Tests:  Recent Labs Lab 07/15/15 1002  AST 37  ALT 41  ALKPHOS 92  BILITOT 0.7  PROT 7.4  ALBUMIN 3.8   No results for input(s): LIPASE, AMYLASE in the last 168 hours. No results for input(s): AMMONIA in the last 168 hours.  Cardiac Enzymes:  Recent Labs Lab 07/15/15 1002  TROPONINI <0.03    BNP (last 3 results) No results for input(s): BNP in the last 8760 hours.  CBG:  Recent Labs Lab 07/16/15 0753 07/17/15 0745  GLUCAP 85 106*    Recent Results (from the past 240 hour(s))  Urine culture     Status: None   Collection Time: 07/15/15 10:41 AM  Result Value Ref Range Status   Specimen Description URINE, CATHETERIZED  Final   Special Requests NONE  Final   Culture   Final    7,000 COLONIES/mL INSIGNIFICANT GROWTH Performed at [facility]    Report Status 07/16/2015 FINAL  Final  MRSA PCR Screening     Status: Abnormal   Collection Time: 07/16/15  8:30 AM  Result Value Ref Range Status   MRSA by PCR POSITIVE (A) NEGATIVE Final    Comment:        The GeneXpert MRSA Assay (FDA approved for NASAL specimens only), is one component of a comprehensive MRSA colonization surveillance program. It is not intended to diagnose MRSA infection nor to guide or monitor treatment for MRSA infections. RESULT CALLED TO, READ  BACK BY AND VERIFIED WITH: CAUDLE,E @ [id number] BY POTEAT,S      Studies: No results found.   Scheduled Meds: . amLODipine  10 mg Oral QHS  . apixaban  2.5 mg Oral Q12H  . cefTRIAXone (ROCEPHIN)  IV  1 g Intravenous Q24H  . Chlorhexidine Gluconate Cloth  6 each Topical Q0600  . cholecalciferol  2,000 Units Oral BID  . donepezil  10 mg Oral QHS  . iron polysaccharides  150 mg Oral Daily  . latanoprost  1 drop Both Eyes QHS  . levothyroxine  25 mcg Oral QAC breakfast  . loratadine  10 mg Oral QHS  . memantine  10 mg Oral QHS  . mupirocin ointment  1 application Nasal BID  . omega-3 acid ethyl  esters  1 g Oral Daily  . vitamin C  1,000 mg Oral BID   Continuous Infusions: . sodium chloride 50 mL/hr at 07/16/15 1033   PRN Meds: acetaminophen **OR** acetaminophen, ondansetron **OR** ondansetron (ZOFRAN) IV  Time spent: 30 minutes  Author: Berle Mull, MD [facility] Pager: [phone] 07/17/2015 2:15 PM  If 7PM-7AM, please contact night-coverage at [email or web address], password [facility]

## 2015-07-17 NOTE — NC FL2 (Signed)
[place] LEVEL OF CARE SCREENING TOOL     IDENTIFICATION  Patient Name: Katherine Miranda Birthdate: July 07, 1920 Sex: female Admission Date (Current Location): 07/15/2015  [facility] [place] and [place] Number:  Herbalist and Address:  [facility],  [place], [place]      Provider Number: [id number]  Attending Physician Name and Address:  Lavina Hamman, MD  Relative Name and Phone Number:  [name] [phone]    Current Level of Care: Hospital Recommended Level of Care: [place] Prior Approval Number:    Date Approved/Denied:   PASRR Number:    Discharge Plan: SNF    Current Diagnoses: Patient Active Problem List   Diagnosis Date Noted  . UTI (lower urinary tract infection) 07/15/2015  . Thyroid activity decreased   . Symptomatic bradycardia 04/20/2015  . Essential hypertension 09/19/2013  . Atrial fibrillation ([place]) 01/03/2013  . Subdural hygroma 10/06/2012  . Fall 10/06/2012  . Dementia 10/06/2012  . Hyponatremia 10/06/2012  . HTN (hypertension) 10/06/2012  . Dyslipidemia 10/06/2012    Orientation RESPIRATION BLADDER Height & Weight     Self    Incontinent Weight: 152 lb (68.947 kg) Height:  5\' 4"  (162.6 cm)  BEHAVIORAL SYMPTOMS/MOOD NEUROLOGICAL BOWEL NUTRITION STATUS      Incontinent Diet  AMBULATORY STATUS COMMUNICATION OF NEEDS Skin     Verbally Normal                       Personal Care Assistance Level of Assistance  Bathing, Dressing           Functional Limitations Info             SPECIAL CARE FACTORS FREQUENCY  PT (By licensed PT), OT (By licensed OT)     PT Frequency: 5 OT Frequency: 5            Contractures      Additional Factors Info  Code Status, Allergies, Isolation Precautions               Current Medications (07/17/2015):  This is the current hospital active medication list Current Facility-Administered Medications  Medication Dose Route  Frequency Provider Last Rate Last Dose  . 0.9 %  sodium chloride infusion   Intravenous Continuous Robbie Lis, MD 50 mL/hr at 07/16/15 1033    . acetaminophen (TYLENOL) tablet 650 mg  650 mg Oral Q6H PRN Robbie Lis, MD       Or  . acetaminophen (TYLENOL) suppository 650 mg  650 mg Rectal Q6H PRN Robbie Lis, MD      . amLODipine (NORVASC) tablet 10 mg  10 mg Oral QHS Robbie Lis, MD   10 mg at 07/16/15 2101  . apixaban (ELIQUIS) tablet 2.5 mg  2.5 mg Oral Q12H Robbie Lis, MD   2.5 mg at 07/17/15 0418  . cefTRIAXone (ROCEPHIN) 1 g in dextrose 5 % 50 mL IVPB  1 g Intravenous Q24H Robbie Lis, MD   1 g at 07/16/15 1134  . Chlorhexidine Gluconate Cloth 2 % PADS 6 each  6 each Topical Q0600 Robbie Lis, MD   6 each at 07/17/15 [phone]  . cholecalciferol (VITAMIN D) tablet 2,000 Units  2,000 Units Oral BID Robbie Lis, MD   2,000 Units at 07/17/15 1035  . donepezil (ARICEPT) tablet 10 mg  10 mg Oral QHS Robbie Lis, MD   10 mg at 07/16/15 2101  .  iron polysaccharides (NIFEREX) capsule 150 mg  150 mg Oral Daily Robbie Lis, MD   150 mg at 07/17/15 1035  . latanoprost (XALATAN) 0.005 % ophthalmic solution 1 drop  1 drop Both Eyes QHS Robbie Lis, MD   1 drop at 07/16/15 2101  . levothyroxine (SYNTHROID, LEVOTHROID) tablet 25 mcg  25 mcg Oral QAC breakfast Robbie Lis, MD   25 mcg at 07/17/15 0827  . loratadine (CLARITIN) tablet 10 mg  10 mg Oral QHS Robbie Lis, MD   10 mg at 07/16/15 2102  . memantine (NAMENDA) tablet 10 mg  10 mg Oral QHS Robbie Lis, MD   10 mg at 07/16/15 2101  . mupirocin ointment (BACTROBAN) 2 % 1 application  1 application Nasal BID Robbie Lis, MD   1 application at [id number]  . omega-3 acid ethyl esters (LOVAZA) capsule 1 g  1 g Oral Daily Robbie Lis, MD   1 g at 07/17/15 1035  . ondansetron (ZOFRAN) tablet 4 mg  4 mg Oral Q6H PRN Robbie Lis, MD       Or  . ondansetron [facility]) injection 4 mg  4 mg Intravenous Q6H PRN Robbie Lis, MD       . vitamin C (ASCORBIC ACID) tablet 1,000 mg  1,000 mg Oral BID Robbie Lis, MD   1,000 mg at 07/17/15 1035     Discharge Medications: Please see discharge summary for a list of discharge medications.  Relevant Imaging Results:  Relevant Lab Results:   Additional Information SSN: [id number].  Ludwig Clarks, LCSW

## 2015-07-17 NOTE — Clinical Social Work Placement (Signed)
   CLINICAL SOCIAL WORK PLACEMENT  NOTE  Date:  07/17/2015  Patient Details  Name: Katherine Miranda MRN: [id number] Date of Birth: 1920/06/05  Clinical Social Work is seeking post-discharge placement for this patient at the [place] level of care (*CSW will initial, date and re-position this form in  chart as items are completed):  Yes   Patient/family provided with [place] Work Department's list of facilities offering this level of care within the geographic area requested by the patient (or if unable, by the patient's family).  Yes   Patient/family informed of their freedom to choose among providers that offer the needed level of care, that participate in Medicare, Medicaid or managed care program needed by the patient, have an available bed and are willing to accept the patient.  Yes   Patient/family informed of [facility]'s ownership interest in [facility] and [facility], as well as of the fact that they are under no obligation to receive care at these facilities.  PASRR submitted to EDS on       PASRR number received on       Existing PASRR number confirmed on 07/17/15     FL2 transmitted to all facilities in geographic area requested by pt/family on 07/17/15     FL2 transmitted to all facilities within larger geographic area on       Patient informed that his/her managed care company has contracts with or will negotiate with certain facilities, including the following:            Patient/family informed of bed offers received.  Patient chooses bed at       Physician recommends and patient chooses bed at      Patient to be transferred to   on  .  Patient to be transferred to facility by       Patient family notified on   of transfer.  Name of family member notified:        PHYSICIAN Please sign FL2, Please prepare priority discharge summary, including medications     Additional Comment:     _______________________________________________ Ludwig Clarks, LCSW 07/17/2015, 2:13 PM

## 2015-07-17 NOTE — NC FL2 (Signed)
[place] LEVEL OF CARE SCREENING TOOL     IDENTIFICATION  Patient Name: Katherine Miranda Birthdate: 12-24-1920 Sex: female Admission Date (Current Location): 07/15/2015  [facility] and [place] Number:  Herbalist and Address:  [facility],  [place], [place]      Provider Number: [id number]  Attending Physician Name and Address:  Lavina Hamman, MD  Relative Name and Phone Number:  [name] [phone]    Current Level of Care: Hospital Recommended Level of Care: [place] Prior Approval Number:    Date Approved/Denied:   PASRR Number: [id number]  Discharge Plan: SNF    Current Diagnoses: Patient Active Problem List   Diagnosis Date Noted  . UTI (lower urinary tract infection) 07/15/2015  . Thyroid activity decreased   . Symptomatic bradycardia 04/20/2015  . Essential hypertension 09/19/2013  . Atrial fibrillation ([place]) 01/03/2013  . Subdural hygroma 10/06/2012  . Fall 10/06/2012  . Dementia 10/06/2012  . Hyponatremia 10/06/2012  . HTN (hypertension) 10/06/2012  . Dyslipidemia 10/06/2012    Orientation RESPIRATION BLADDER Height & Weight     Self    Incontinent Weight: 152 lb (68.947 kg) Height:  5\' 4"  (162.6 cm)  BEHAVIORAL SYMPTOMS/MOOD NEUROLOGICAL BOWEL NUTRITION STATUS      Incontinent Diet  AMBULATORY STATUS COMMUNICATION OF NEEDS Skin     Verbally Normal                       Personal Care Assistance Level of Assistance  Bathing, Dressing           Functional Limitations Info             SPECIAL CARE FACTORS FREQUENCY  PT (By licensed PT), OT (By licensed OT)     PT Frequency: 5 OT Frequency: 5            Contractures      Additional Factors Info  Code Status, Allergies, Isolation Precautions               Current Medications (07/17/2015):  This is the current hospital active medication list Current Facility-Administered Medications  Medication Dose  Route Frequency Provider Last Rate Last Dose  . 0.9 %  sodium chloride infusion   Intravenous Continuous Robbie Lis, MD 50 mL/hr at 07/16/15 1033    . acetaminophen (TYLENOL) tablet 650 mg  650 mg Oral Q6H PRN Robbie Lis, MD       Or  . acetaminophen (TYLENOL) suppository 650 mg  650 mg Rectal Q6H PRN Robbie Lis, MD      . amLODipine (NORVASC) tablet 10 mg  10 mg Oral QHS Robbie Lis, MD   10 mg at 07/16/15 2101  . apixaban (ELIQUIS) tablet 2.5 mg  2.5 mg Oral Q12H Robbie Lis, MD   2.5 mg at 07/17/15 0418  . cefTRIAXone (ROCEPHIN) 1 g in dextrose 5 % 50 mL IVPB  1 g Intravenous Q24H Robbie Lis, MD   1 g at 07/17/15 1257  . Chlorhexidine Gluconate Cloth 2 % PADS 6 each  6 each Topical Q0600 Robbie Lis, MD   6 each at 07/17/15 [phone]  . cholecalciferol (VITAMIN D) tablet 2,000 Units  2,000 Units Oral BID Robbie Lis, MD   2,000 Units at 07/17/15 1035  . donepezil (ARICEPT) tablet 10 mg  10 mg Oral QHS Robbie Lis, MD   10 mg at 07/16/15 2101  .  iron polysaccharides (NIFEREX) capsule 150 mg  150 mg Oral Daily Robbie Lis, MD   150 mg at 07/17/15 1035  . latanoprost (XALATAN) 0.005 % ophthalmic solution 1 drop  1 drop Both Eyes QHS Robbie Lis, MD   1 drop at 07/16/15 2101  . levothyroxine (SYNTHROID, LEVOTHROID) tablet 25 mcg  25 mcg Oral QAC breakfast Robbie Lis, MD   25 mcg at 07/17/15 0827  . loratadine (CLARITIN) tablet 10 mg  10 mg Oral QHS Robbie Lis, MD   10 mg at 07/16/15 2102  . memantine (NAMENDA) tablet 10 mg  10 mg Oral QHS Robbie Lis, MD   10 mg at 07/16/15 2101  . mupirocin ointment (BACTROBAN) 2 % 1 application  1 application Nasal BID Robbie Lis, MD   1 application at [id number]  . omega-3 acid ethyl esters (LOVAZA) capsule 1 g  1 g Oral Daily Robbie Lis, MD   1 g at 07/17/15 1035  . ondansetron (ZOFRAN) tablet 4 mg  4 mg Oral Q6H PRN Robbie Lis, MD       Or  . ondansetron [facility]) injection 4 mg  4 mg Intravenous Q6H PRN Robbie Lis,  MD      . vitamin C (ASCORBIC ACID) tablet 1,000 mg  1,000 mg Oral BID Robbie Lis, MD   1,000 mg at 07/17/15 1035     Discharge Medications: Please see discharge summary for a list of discharge medications.  Relevant Imaging Results:  Relevant Lab Results:   Additional Information SSN: [id number].  Ludwig Clarks, LCSW

## 2015-07-17 NOTE — Evaluation (Signed)
Physical Therapy Evaluation Patient Details Name: Katherine Miranda MRN: [id number] DOB: 1921-06-01 Today's Date: 07/17/2015   History of Present Illness  Pt is a 82 year old female with hx of dementia, HTN, afib and admitted with generalized weakness likely due to combination of dementia, UTI  Clinical Impression  Pt admitted with above diagnosis. Pt currently with functional limitations due to the deficits listed below (see PT Problem List).   Pt performed multiple sit to stands for pericare and then assisted to recliner due to fatigue. Pt will benefit from skilled PT to increase their independence and safety with mobility to allow discharge to the venue listed below.       Follow Up Recommendations SNF;Supervision/Assistance - 24 hour    Equipment Recommendations  None recommended by PT    Recommendations for Other Services       Precautions / Restrictions Precautions Precautions: Fall Precaution Comments: incontinent      Mobility  Bed Mobility Overal bed mobility: Needs Assistance Bed Mobility: Supine to Sit     Supine to sit: Supervision     General bed mobility comments: verbal cues for initiating  Transfers Overall transfer level: Needs assistance Equipment used: Rolling walker (2 wheeled) Transfers: Sit to/from [facility] Sit to Stand: Mod assist;+2 safety/equipment Stand pivot transfers: Mod assist;+2 safety/equipment       General transfer comment: multiple sit to stands for pericare (loose BM) which fatigued pt so only able to pivot to recliner today  Ambulation/Gait                Stairs            Wheelchair Mobility    Modified Rankin (Stroke Patients Only)       Balance                                             Pertinent Vitals/Pain Pain Assessment: No/denies pain    Home Living Family/patient expects to be discharged to:: Private residence                 Additional Comments:  pt poor historian    Prior Function Level of Independence: Needs assistance         Comments: pt reports she has caregiver that assists with bathing and dressing     Hand Dominance        Extremity/Trunk Assessment               Lower Extremity Assessment: Generalized weakness         Communication   Communication: No difficulties  Cognition Arousal/Alertness: Awake/alert Behavior During Therapy: WFL for tasks assessed/performed Overall Cognitive Status: No family/caregiver present to determine baseline cognitive functioning (hx dementia, pleasantly confused)                      General Comments      Exercises        Assessment/Plan    PT Assessment Patient needs continued PT services  PT Diagnosis Difficulty walking;Generalized weakness   PT Problem List Decreased strength;Decreased activity tolerance;Decreased balance;Decreased mobility;Decreased safety awareness;Decreased knowledge of use of DME  PT Treatment Interventions DME instruction;Gait training;Functional mobility training;Patient/family education;Therapeutic activities;Therapeutic exercise   PT Goals (Current goals can be found in the Care Plan section) Acute Rehab PT Goals PT Goal Formulation: Patient unable to participate in goal  setting Time For Goal Achievement: 07/24/15 Potential to Achieve Goals: Good    Frequency Min 2X/week   Barriers to discharge        Co-evaluation               End of Session Equipment Utilized During Treatment: Gait belt Activity Tolerance: Patient tolerated treatment well Patient left: in chair;with call bell/phone within reach;with chair alarm set           Time: [id number] PT Time Calculation (min) (ACUTE ONLY): 20 min   Charges:   PT Evaluation $PT Eval Low Complexity: 1 Procedure     PT G Codes:        Valiant Dills,KATHrine E 07/17/2015, 3:01 PM Carmelia Bake, PT, DPT 07/17/2015 Pager: [phone]

## 2015-07-18 DIAGNOSIS — F0390 Unspecified dementia without behavioral disturbance: Secondary | ICD-10-CM | POA: Diagnosis not present

## 2015-07-18 DIAGNOSIS — R269 Unspecified abnormalities of gait and mobility: Secondary | ICD-10-CM | POA: Diagnosis not present

## 2015-07-18 DIAGNOSIS — I480 Paroxysmal atrial fibrillation: Secondary | ICD-10-CM | POA: Diagnosis not present

## 2015-07-18 DIAGNOSIS — R2689 Other abnormalities of gait and mobility: Secondary | ICD-10-CM | POA: Diagnosis not present

## 2015-07-18 DIAGNOSIS — N390 Urinary tract infection, site not specified: Secondary | ICD-10-CM | POA: Diagnosis not present

## 2015-07-18 DIAGNOSIS — I10 Essential (primary) hypertension: Secondary | ICD-10-CM | POA: Diagnosis not present

## 2015-07-18 DIAGNOSIS — H40053 Ocular hypertension, bilateral: Secondary | ICD-10-CM | POA: Diagnosis not present

## 2015-07-18 DIAGNOSIS — R5381 Other malaise: Secondary | ICD-10-CM | POA: Diagnosis not present

## 2015-07-18 DIAGNOSIS — I4891 Unspecified atrial fibrillation: Secondary | ICD-10-CM | POA: Diagnosis not present

## 2015-07-18 DIAGNOSIS — E785 Hyperlipidemia, unspecified: Secondary | ICD-10-CM | POA: Diagnosis not present

## 2015-07-18 DIAGNOSIS — M810 Age-related osteoporosis without current pathological fracture: Secondary | ICD-10-CM | POA: Diagnosis not present

## 2015-07-18 DIAGNOSIS — N3090 Cystitis, unspecified without hematuria: Secondary | ICD-10-CM | POA: Diagnosis not present

## 2015-07-18 DIAGNOSIS — R1312 Dysphagia, oropharyngeal phase: Secondary | ICD-10-CM | POA: Diagnosis not present

## 2015-07-18 DIAGNOSIS — G301 Alzheimer's disease with late onset: Secondary | ICD-10-CM | POA: Diagnosis not present

## 2015-07-18 DIAGNOSIS — R531 Weakness: Secondary | ICD-10-CM | POA: Diagnosis not present

## 2015-07-18 DIAGNOSIS — D509 Iron deficiency anemia, unspecified: Secondary | ICD-10-CM | POA: Diagnosis not present

## 2015-07-18 DIAGNOSIS — R278 Other lack of coordination: Secondary | ICD-10-CM | POA: Diagnosis not present

## 2015-07-18 DIAGNOSIS — M6281 Muscle weakness (generalized): Secondary | ICD-10-CM | POA: Diagnosis not present

## 2015-07-18 LAB — CBC WITH DIFFERENTIAL/PLATELET
Basophils Absolute: 0 10*3/uL (ref 0.0–0.1)
Basophils Relative: 0 %
EOS ABS: 0.1 10*3/uL (ref 0.0–0.7)
EOS PCT: 3 %
HCT: 40.2 % (ref 36.0–46.0)
Hemoglobin: 13.2 g/dL (ref 12.0–15.0)
LYMPHS ABS: 0.9 10*3/uL (ref 0.7–4.0)
Lymphocytes Relative: 18 %
MCH: 29.3 pg (ref 26.0–34.0)
MCHC: 32.8 g/dL (ref 30.0–36.0)
MCV: 89.3 fL (ref 78.0–100.0)
MONO ABS: 0.7 10*3/uL (ref 0.1–1.0)
MONOS PCT: 15 %
Neutro Abs: 3.1 10*3/uL (ref 1.7–7.7)
Neutrophils Relative %: 64 %
PLATELETS: 232 10*3/uL (ref 150–400)
RBC: 4.5 MIL/uL (ref 3.87–5.11)
RDW: 16.2 % — AB (ref 11.5–15.5)
WBC: 4.8 10*3/uL (ref 4.0–10.5)

## 2015-07-18 LAB — BASIC METABOLIC PANEL
Anion gap: 9 (ref 5–15)
BUN: 20 mg/dL (ref 6–20)
CO2: 27 mmol/L (ref 22–32)
CREATININE: 0.75 mg/dL (ref 0.44–1.00)
Calcium: 8.8 mg/dL — ABNORMAL LOW (ref 8.9–10.3)
Chloride: 102 mmol/L (ref 101–111)
GFR calc Af Amer: >60 mL/min (ref >60)
GLUCOSE: 104 mg/dL — AB (ref 65–99)
Potassium: 3.9 mmol/L (ref 3.5–5.1)
SODIUM: 138 mmol/L (ref 135–145)

## 2015-07-18 LAB — GLUCOSE, CAPILLARY: GLUCOSE-CAPILLARY: 108 mg/dL — AB (ref 65–99)

## 2015-07-18 MED ORDER — MUPIROCIN 2 % EX OINT: 1.0000 "application " | TOPICAL_OINTMENT | Freq: Two times a day (BID) | CUTANEOUS | Status: DC

## 2015-07-18 NOTE — Discharge Summary (Addendum)
[facility] Discharge Summary   Patient: Katherine Miranda [id number]   PCP: [name], MD DOB: 1920/09/21   Date of admission: 07/15/2015   Date of discharge: 07/18/2015    Discharge Diagnoses:  Principal Problem:   UTI (lower urinary tract infection) Active Problems:   Dementia   HTN (hypertension)   Dyslipidemia   Atrial fibrillation ([place])   Essential hypertension   Dysphagia   Recommendations for Outpatient Follow-up:  1. Follow-up with PCP in one week  2. Discharging at [facility]  Follow-up Information    Follow up with SHAW,[name], MD. Schedule an appointment as soon as possible for a visit in 1 week.   Specialty:  Family Medicine   Contact information:   [address] [place] [address] [phone]      Diet recommendation: Regular diet with aspiration precaution  Activity: The patient is advised to gradually reintroduce usual activities.  Discharge Condition: good  History of present illness: As per the H and P dictated on admission, "82 year old female with past medical history of hypertension, atrial fibrillation on AC with ELiquis, hypothyroidism, dyslipidemia, dementia who presented to [facility] ED with more lethargy and weakness as reported by her daughter at the bedside. Apparently she is usually able to get up, usually has normal speech but with signs of dementia but over a day or so her daughter noticed she was moving slower and was actually more in bed and had slower speech. She also seemed more confused. No reports of shortness of breath, chest pain, palpitations. Pt herself is not a good historian due to dementia. No reports of fevers. No diarrhea or constipation. No blood in stool or urine.  In ED. BP was 122/52, HR 63, T max 97.6 F, oxygen saturation 92-99% on room air. Blood work showed slight elevation in Cr 1.0 (baseline 1.33). UA showed moderate leukocytes and many bacteria. She was started on rocephin empirically.  CT head showed stable findings"  Hospital Course:  Summary of her active problems in the hospital is as following. 1. UTI (lower urinary tract infection) Patient presents with complaints of generalized weakness and fatigue. Patient was admitted for suspected UTI. Urine does not show any evidence of significant growth. Lab work appears stable. We finished 3 day course of antibiotics.  2. Dysphagia.  Last admission the patient was given dysphagia type II diet. At present the daughter is concerned about patient's swallowing.  Reconsultation with a speech therapy- regular thin liquid with aspiration precaution  3. dementia. Patient is pleasantly confused no behavioral issues will continue to closely monitor.  4. Atrial fibrillation. Continue to anticoagulation with Apixaban. Currently rate controlled.  5. essential hypertension. Continuing her home medication.  6. hypothyroidism. Continue Synthroid. TSH significantly better would recommend outpatient recheck before adjusting Synthroid level.  7. Facial swelling. The daughter is concerned about patient's having facial swelling at present I do not see any evidence of facial swelling now evidence of angioedema on examination. Elevated BUN and creatinine patient on admission was felt to be dehydrated and was given IV fluids. Recommend monitor as an outpatient  8. MRSA PCR Treat with mupirocin   All other chronic medical condition were stable during the hospitalization.  Patient was seen by physical therapy, who recommended SNF, which was arranged by Education officer, museum and case Freight forwarder. On the day of the discharge the patient's vitals remain stable, and no other acute medical condition were reported by patient. the patient was felt safe to be discharge at  SNF with physical therapy.  Procedures and Results:  None   Consultations:  None  DISCHARGE MEDICATION: Current Discharge Medication List    START taking these medications    Details  mupirocin ointment (BACTROBAN) 2 % Place 1 application into the nose 2 (two) times daily. Qty: 22 g, Refills: 0      CONTINUE these medications which have NOT CHANGED   Details  alendronate (FOSAMAX) 70 MG tablet Take 70 mg by mouth every Tuesday. Take with a full glass of water on an empty stomach.    amLODipine (NORVASC) 10 MG tablet Take 10 mg by mouth at bedtime.    Ascorbic Acid (VITAMIN C) 1000 MG tablet Take 1,000 mg by mouth 2 (two) times daily.     CALCIUM PO Take 1 tablet by mouth 2 (two) times daily.     Cholecalciferol (VITAMIN D) 2000 UNITS tablet Take 2,000 Units by mouth 2 (two) times daily.    Coenzyme Q10 (COQ10) 200 MG CAPS Take 200 mg by mouth daily.    CRANBERRY PO Take 300 mg by mouth daily.     donepezil (ARICEPT) 10 MG tablet Take 10 mg by mouth at bedtime.    ELIQUIS 2.5 MG TABS tablet TAKE 1 TABLET TWICE A DAY Qty: 180 tablet, Refills: 3    iron polysaccharides (NIFEREX) 150 MG capsule Take 150 mg by mouth daily.    levothyroxine (SYNTHROID, LEVOTHROID) 25 MCG tablet Take 1 tablet (25 mcg total) by mouth daily before breakfast.    loratadine (CLARITIN) 10 MG tablet Take 10 mg by mouth at bedtime.    memantine (NAMENDA) 10 MG tablet Take 10 mg by mouth at bedtime.     Omega-3 Fatty Acids (FISH OIL) 1000 MG CAPS Take 1,000 mg by mouth daily.    Travoprost, BAK Free, (TRAVATAN) 0.004 % SOLN ophthalmic solution Place 1 drop into both eyes at bedtime.    Maltodextrin-Xanthan Gum (RESOURCE THICKENUP CLEAR) POWD NECTAR THICK LIQUIDS      STOP taking these medications     cephALEXin (KEFLEX) 250 MG capsule        No Known Allergies Discharge Instructions    Diet general    Complete by:  As directed      Discharge instructions    Complete by:  As directed   It is important that you read following instructions as well as go over your medication list with RN to help you understand your care after this hospitalization.  Discharge  Instructions: Please follow-up with PCP in one week  Please request your primary care physician to go over all Hospital Tests and Procedure/Radiological results at the follow up,  Please get all Hospital records sent to your PCP by signing hospital release before you go home.   Do not take more than prescribed Pain, Sleep and Anxiety Medications. You were cared for by a hospitalist during your hospital stay. If you have any questions about your discharge medications or the care you received while you were in the hospital after you are discharged, you can call the unit and ask to speak with the hospitalist on call if the hospitalist that took care of you is not available.  Once you are discharged, your primary care physician will handle any further medical issues. Please note that NO REFILLS for any discharge medications will be authorized once you are discharged, as it is imperative that you return to your primary care physician (or establish a relationship with a primary care physician if you  do not have one) for your aftercare needs so that they can reassess your need for medications and monitor your lab values. You Must read complete instructions/literature along with all the possible adverse reactions/side effects for all the Medicines you take and that have been prescribed to you. Take any new Medicines after you have completely understood and accept all the possible adverse reactions/side effects. Wear Seat belts while driving.     Increase activity slowly    Complete by:  As directed           Discharge Exam: Filed Weights   07/15/15 1639  Weight: 68.947 kg (152 lb)   Filed Vitals:   07/18/15 0155 07/18/15 0526  BP:  155/60  Pulse:  64  Temp: 96.7 F (35.9 C) 97.7 F (36.5 C)  Resp:  18   General: Appear in no distress, no Rash; Oral Mucosa moist. Cardiovascular: S1 and S2 Present, no Murmur, no JVD Respiratory: Bilateral Air entry present and Clear to Auscultation, no  Crackles, no wheezes Abdomen: Bowel Sound present, Soft and no tenderness Extremities: no Pedal edema, no calf tenderness Neurology: Grossly no focal neuro deficit.  The results of significant diagnostics from this hospitalization (including imaging, microbiology, ancillary and laboratory) are listed below for reference.    Significant Diagnostic Studies: Ct Head Wo Contrast  07/15/2015  CLINICAL DATA:  Confusion.  Urinary tract infection. EXAM: CT HEAD WITHOUT CONTRAST TECHNIQUE: Contiguous axial images were obtained from the base of the skull through the vertex without intravenous contrast. COMPARISON:  04/15/2015 head CT. FINDINGS: No evidence of parenchymal hemorrhage or extra-axial fluid collection. No mass lesion, mass effect, or midline shift. No CT evidence of acute infarction. Diffuse cerebral volume loss appears stable. Intracranial atherosclerosis. Nonspecific stable subcortical and periventricular white matter hypodensity, most in keeping with chronic small vessel ischemic change. Cerebral ventricle sizes are stable and concordant with the degree of cerebral volume loss. The visualized paranasal sinuses are essentially clear. The mastoid air cells are unopacified. No evidence of calvarial fracture. IMPRESSION: 1.  No evidence of acute intracranial abnormality. 2. Stable generalized cerebral volume loss, intracranial atherosclerosis and chronic small vessel ischemic white matter change. Electronically Signed   By: Ilona Sorrel M.D.   On: 07/15/2015 15:27    Microbiology: Recent Results (from the past 240 hour(s))  Urine culture     Status: None   Collection Time: 07/15/15 10:41 AM  Result Value Ref Range Status   Specimen Description URINE, CATHETERIZED  Final   Special Requests NONE  Final   Culture   Final    7,000 COLONIES/mL INSIGNIFICANT GROWTH Performed at [facility]    Report Status 07/16/2015 FINAL  Final  MRSA PCR Screening     Status: Abnormal   Collection Time:  07/16/15  8:30 AM  Result Value Ref Range Status   MRSA by PCR POSITIVE (A) NEGATIVE Final    Comment:        The GeneXpert MRSA Assay (FDA approved for NASAL specimens only), is one component of a comprehensive MRSA colonization surveillance program. It is not intended to diagnose MRSA infection nor to guide or monitor treatment for MRSA infections. RESULT CALLED TO, READ BACK BY AND VERIFIED WITH: CAUDLE,E @ [place] [id number] BY POTEAT,S      Labs: CBC:  Recent Labs Lab 07/15/15 1002 07/16/15 0553 07/18/15 0522  WBC 4.2 3.7* 4.8  NEUTROABS 2.8  --  3.1  HGB 13.1 12.3 13.2  HCT 38.7 37.5 40.2  MCV  86.8 90.1 89.3  PLT 225 223 [id number]   Basic Metabolic Panel:  Recent Labs Lab 07/15/15 1002 07/16/15 0553 07/18/15 0522  NA 139 140 138  K 4.1 4.3 3.9  CL 102 105 102  CO2 28 26 27   GLUCOSE 99 92 104*  BUN 26* 21* 20  CREATININE 1.06* 0.88 0.75  CALCIUM 9.6 8.7* 8.8*  MG 2.0  --   --    Liver Function Tests:  Recent Labs Lab 07/15/15 1002  AST 37  ALT 41  ALKPHOS 92  BILITOT 0.7  PROT 7.4  ALBUMIN 3.8   No results for input(s): LIPASE, AMYLASE in the last 168 hours. No results for input(s): AMMONIA in the last 168 hours. Cardiac Enzymes:  Recent Labs Lab 07/15/15 1002  TROPONINI <0.03   BNP (last 3 results) No results for input(s): BNP in the last 8760 hours. CBG:  Recent Labs Lab 07/16/15 0753 07/17/15 0745 07/18/15 0745  GLUCAP 85 106* 108*   Time spent: 30 minutes  Signed:  Lizanne Erker  [facility] 07/18/2015, 12:16 PM

## 2015-07-18 NOTE — Clinical Social Work Placement (Signed)
Patient has a bed at [facility]. Anticipating possible discharge today after swallow eval.     Raynaldo Opitz, [place] Worker cell #: [phone]     CLINICAL SOCIAL WORK PLACEMENT  NOTE  Date:  07/18/2015  Patient Details  Name: ADALEENA MARING MRN: [id number] Date of Birth: 03-05-1921  Clinical Social Work is seeking post-discharge placement for this patient at the [place] level of care (*CSW will initial, date and re-position this form in  chart as items are completed):  Yes   Patient/family provided with [place] Work Department's list of facilities offering this level of care within the geographic area requested by the patient (or if unable, by the patient's family).  Yes   Patient/family informed of their freedom to choose among providers that offer the needed level of care, that participate in Medicare, Medicaid or managed care program needed by the patient, have an available bed and are willing to accept the patient.  Yes   Patient/family informed of [facility]'s ownership interest in [facility] and [facility], as well as of the fact that they are under no obligation to receive care at these facilities.  PASRR submitted to EDS on       PASRR number received on       Existing PASRR number confirmed on 07/17/15     FL2 transmitted to all facilities in geographic area requested by pt/family on 07/17/15     FL2 transmitted to all facilities within larger geographic area on       Patient informed that his/her managed care company has contracts with or will negotiate with certain facilities, including the following:        Yes   Patient/family informed of bed offers received.  Patient chooses bed at [facility]     Physician recommends and patient chooses bed at      Patient to be transferred to [facility] on  .  Patient to be transferred to facility by       Patient  family notified on   of transfer.  Name of family member notified:        PHYSICIAN Please sign FL2, Please prepare priority discharge summary, including medications     Additional Comment:    _______________________________________________ Standley Brooking, LCSW 07/18/2015, 11:04 AM

## 2015-07-18 NOTE — Evaluation (Signed)
Clinical/Bedside Swallow Evaluation Patient Details  Name: Katherine Miranda MRN: [id number] Date of Birth: 06-15-1920  Today's Date: 07/18/2015 Time: SLP Start Time (ACUTE ONLY): 1033 SLP Stop Time (ACUTE ONLY): 1110 SLP Time Calculation (min) (ACUTE ONLY): 37 min  Past Medical History:  Past Medical History  Diagnosis Date  . Hypertension   . Dementia   . Arthritis   . A-fib [facility])    Past Surgical History:  Past Surgical History  Procedure Laterality Date  . Knee arthroscopy    . Back surgery    . Cardiac catheterization N/A 04/20/2015    Procedure: Temporary Pacemaker;  Surgeon: Lorretta Harp, MD;  Location: [place] CV LAB;  Service: Cardiovascular;  Laterality: N/A;   HPI:  82 yo female adm to [facility] with AMS, found to have UTI. PMH + for dementia, pt reports she lives at home with family but reports she was at [facility] today.   Swallow evaluation ordered due to daughter reporting pt with increased coughing with intake.  Pt CT head negative upon admit.     Assessment / Plan / Recommendation Clinical Impression  Pt has h/o dysphagia that is likely chronic secondary to her dementia - Clinical evaluation completed observing pt consuming 4 ounces OJ, 4 ounces applesauce, 4 graham crackers = self feeding.  Right facial asymmetry noted which pt states is normal.  Baseline wet voice x1 noted that cleared with cued throat clear.   Observed pt consuming graham crackers x4, 4 ounces applesauce, and 4 ounces orange juice.  Pt with cough x2 of approximately 20 swallow- occuring x1 with applesauce.  She does report sensation of residuals in throat x1 that reportedly cleared with dry swallows.  Reviewed MBS video with pt and informed pt/RN to findings and reasoning for swallow precautions/migitation strategies.  Recommend start regular/thin diet with compensations to mitigate chronic deficits.      Aspiration Risk  Mild aspiration risk    Diet Recommendation Regular;Thin liquid    Liquid Administration via: Cup;Straw Medication Administration: Whole meds with puree Supervision: Patient able to self feed Compensations: Slow rate;Small sips/bites;Follow solids with liquid (swallow twice with liquids) Postural Changes: Seated upright at 90 degrees;Remain upright for at least 30 minutes after po intake    Other  Recommendations Oral Care Recommendations: Oral care BID   Follow up Recommendations  None    Frequency and Duration min 1 x/week  1 week       Prognosis Prognosis for Safe Diet Advancement: Fair      Swallow Study   General Date of Onset: 07/18/15 HPI: 82 yo female adm to [facility] with AMS, found to have UTI. PMH + for dementia, pt reports she lives at home with family but reports she was at [facility] today.   Swallow evaluation ordered due to daughter reporting pt with increased coughing with intake.  Pt CT head negative upon admit.   Type of Study: Bedside Swallow Evaluation Diet Prior to this Study: NPO Temperature Spikes Noted: No Respiratory Status: Room air History of Recent Intubation: No Behavior/Cognition: Alert;Cooperative;Pleasant mood Oral Cavity Assessment: Within Functional Limits Oral Care Completed by SLP: No Oral Cavity - Dentition: Adequate natural dentition Vision: Functional for self-feeding Self-Feeding Abilities: Able to feed self Patient Positioning: Upright in bed Baseline Vocal Quality: Normal Volitional Cough: Strong Volitional Swallow: Able to elicit    Oral/Motor/Sensory Function Overall Oral Motor/Sensory Function:  (? movement on right facial, pt reports this to be baseline )   Ice Chips Ice chips: Not  tested   Thin Liquid Thin Liquid: Impaired Presentation: Cup;Self Fed Pharyngeal  Phase Impairments: Multiple swallows    Nectar Thick Nectar Thick Liquid: Not tested   Honey Thick Honey Thick Liquid: Not tested   Puree Puree: Within functional limits Presentation: Self Fed;Spoon   Solid   GO   Solid: Within  functional limits Presentation: [place], [place], [facility] SLP [phone]

## 2015-07-18 NOTE — Care Management Note (Signed)
Case Management Note  Patient Details  Name: Katherine Miranda MRN: [id number] Date of Birth: 1920-11-28  Subjective/Objective:                    Action/Plan:d/c SNF   Expected Discharge Date:   (unknown)               Expected Discharge Plan:  Skilled Nursing Facility  In-House Referral:  Clinical Social Work  Discharge planning Services  CM Consult  Post Acute Care Choice:    Choice offered to:     DME Arranged:    DME Agency:     HH Arranged:    [place] Agency:     Status of Service:  Completed, signed off  Medicare Important Message Given:    Date Medicare IM Given:    Medicare IM give by:    Date Additional Medicare IM Given:    Additional Medicare Important Message give by:     If discussed at [place] of Stay Meetings, dates discussed:    Additional Comments:  Dessa Phi, RN 07/18/2015, 12:28 PM

## 2015-07-18 NOTE — Progress Notes (Signed)
Pt discharged from the unit via PTAR. Pt discharge instructions were reviewed with the daughter prior to discharge. Pt belongings were sent home with pt family members. Pt family members had bad experience prior hospitalizations and talked to management prior to discharge. No questions or concerns from the pt at this time.  Wendelyn Kiesling W Reannon Candella, RN

## 2015-07-18 NOTE — Clinical Social Work Placement (Signed)
Patient is set to discharge to [facility] SNF today. Patient & daughter, [name] aware. Discharge packet given to RN, Abby. PTAR called for transport to pickup at 3:30pm per daughter's request.     Raynaldo Opitz, [place] Worker cell #: [phone]    CLINICAL SOCIAL WORK PLACEMENT  NOTE  Date:  07/18/2015  Patient Details  Name: Katherine Miranda MRN: [id number] Date of Birth: 01-23-1921  Clinical Social Work is seeking post-discharge placement for this patient at the [place] level of care (*CSW will initial, date and re-position this form in  chart as items are completed):  Yes   Patient/family provided with [place] Work Department's list of facilities offering this level of care within the geographic area requested by the patient (or if unable, by the patient's family).  Yes   Patient/family informed of their freedom to choose among providers that offer the needed level of care, that participate in Medicare, Medicaid or managed care program needed by the patient, have an available bed and are willing to accept the patient.  Yes   Patient/family informed of [facility]'s ownership interest in [facility], [place] and [facility], as well as of the fact that they are under no obligation to receive care at these facilities.  PASRR submitted to EDS on       PASRR number received on       Existing PASRR number confirmed on 07/17/15     FL2 transmitted to all facilities in geographic area requested by pt/family on 07/17/15     FL2 transmitted to all facilities within larger geographic area on       Patient informed that his/her managed care company has contracts with or will negotiate with certain facilities, including the following:        Yes   Patient/family informed of bed offers received.  Patient chooses bed at [facility]     Physician recommends and patient chooses bed at      Patient to be  transferred to [facility] on 07/18/15.  Patient to be transferred to facility by PTAR     Patient family notified on 07/18/15 of transfer.  Name of family member notified:  patient's daughter at bedside     PHYSICIAN Please sign FL2, Please prepare priority discharge summary, including medications     Additional Comment:    _______________________________________________ Standley Brooking, LCSW 07/18/2015, 12:45 PM

## 2015-07-18 NOTE — Care Management Important Message (Signed)
Important Message  Patient Details Important Message  Patient Details IM Letter given to Kathy/Case Manager to present to Patient Name: ASPEN SHEWMAKE MRN: [id number] Date of Birth: 03-18-1921   Medicare Important Message Given:  Yes    Camillo Flaming 07/18/2015, 2:20 PM Name: ELDER WILGER MRN: [id number] Date of Birth: Feb 09, 1921   Medicare Important Message Given:  Yes    Camillo Flaming 07/18/2015, 2:19 PM

## 2015-07-19 DIAGNOSIS — G301 Alzheimer's disease with late onset: Secondary | ICD-10-CM | POA: Diagnosis not present

## 2015-07-19 DIAGNOSIS — R5381 Other malaise: Secondary | ICD-10-CM | POA: Diagnosis not present

## 2015-07-19 DIAGNOSIS — I480 Paroxysmal atrial fibrillation: Secondary | ICD-10-CM | POA: Diagnosis not present

## 2015-08-11 DIAGNOSIS — I4891 Unspecified atrial fibrillation: Secondary | ICD-10-CM | POA: Diagnosis not present

## 2015-08-11 DIAGNOSIS — M810 Age-related osteoporosis without current pathological fracture: Secondary | ICD-10-CM | POA: Diagnosis not present

## 2015-08-11 DIAGNOSIS — N390 Urinary tract infection, site not specified: Secondary | ICD-10-CM | POA: Diagnosis not present

## 2015-08-11 DIAGNOSIS — E785 Hyperlipidemia, unspecified: Secondary | ICD-10-CM | POA: Diagnosis not present

## 2015-08-11 DIAGNOSIS — I10 Essential (primary) hypertension: Secondary | ICD-10-CM | POA: Diagnosis not present

## 2015-08-11 DIAGNOSIS — R1312 Dysphagia, oropharyngeal phase: Secondary | ICD-10-CM | POA: Diagnosis not present

## 2015-08-11 DIAGNOSIS — D509 Iron deficiency anemia, unspecified: Secondary | ICD-10-CM | POA: Diagnosis not present

## 2015-08-11 DIAGNOSIS — R41841 Cognitive communication deficit: Secondary | ICD-10-CM | POA: Diagnosis not present

## 2015-08-11 DIAGNOSIS — F0390 Unspecified dementia without behavioral disturbance: Secondary | ICD-10-CM | POA: Diagnosis not present

## 2015-08-11 DIAGNOSIS — E039 Hypothyroidism, unspecified: Secondary | ICD-10-CM | POA: Diagnosis not present

## 2015-08-13 DIAGNOSIS — F0390 Unspecified dementia without behavioral disturbance: Secondary | ICD-10-CM | POA: Diagnosis not present

## 2015-08-13 DIAGNOSIS — I10 Essential (primary) hypertension: Secondary | ICD-10-CM | POA: Diagnosis not present

## 2015-08-13 DIAGNOSIS — D509 Iron deficiency anemia, unspecified: Secondary | ICD-10-CM | POA: Diagnosis not present

## 2015-08-13 DIAGNOSIS — I4891 Unspecified atrial fibrillation: Secondary | ICD-10-CM | POA: Diagnosis not present

## 2015-08-13 DIAGNOSIS — N390 Urinary tract infection, site not specified: Secondary | ICD-10-CM | POA: Diagnosis not present

## 2015-08-13 DIAGNOSIS — R1312 Dysphagia, oropharyngeal phase: Secondary | ICD-10-CM | POA: Diagnosis not present

## 2015-08-15 DIAGNOSIS — N390 Urinary tract infection, site not specified: Secondary | ICD-10-CM | POA: Diagnosis not present

## 2015-08-15 DIAGNOSIS — I4891 Unspecified atrial fibrillation: Secondary | ICD-10-CM | POA: Diagnosis not present

## 2015-08-15 DIAGNOSIS — D509 Iron deficiency anemia, unspecified: Secondary | ICD-10-CM | POA: Diagnosis not present

## 2015-08-15 DIAGNOSIS — R1312 Dysphagia, oropharyngeal phase: Secondary | ICD-10-CM | POA: Diagnosis not present

## 2015-08-15 DIAGNOSIS — I10 Essential (primary) hypertension: Secondary | ICD-10-CM | POA: Diagnosis not present

## 2015-08-15 DIAGNOSIS — F0390 Unspecified dementia without behavioral disturbance: Secondary | ICD-10-CM | POA: Diagnosis not present

## 2015-08-19 DIAGNOSIS — F0390 Unspecified dementia without behavioral disturbance: Secondary | ICD-10-CM | POA: Diagnosis not present

## 2015-08-19 DIAGNOSIS — N390 Urinary tract infection, site not specified: Secondary | ICD-10-CM | POA: Diagnosis not present

## 2015-08-20 DIAGNOSIS — N390 Urinary tract infection, site not specified: Secondary | ICD-10-CM | POA: Diagnosis not present

## 2015-08-20 DIAGNOSIS — R1312 Dysphagia, oropharyngeal phase: Secondary | ICD-10-CM | POA: Diagnosis not present

## 2015-08-20 DIAGNOSIS — D509 Iron deficiency anemia, unspecified: Secondary | ICD-10-CM | POA: Diagnosis not present

## 2015-08-20 DIAGNOSIS — I10 Essential (primary) hypertension: Secondary | ICD-10-CM | POA: Diagnosis not present

## 2015-08-20 DIAGNOSIS — F0390 Unspecified dementia without behavioral disturbance: Secondary | ICD-10-CM | POA: Diagnosis not present

## 2015-08-20 DIAGNOSIS — I4891 Unspecified atrial fibrillation: Secondary | ICD-10-CM | POA: Diagnosis not present

## 2015-08-21 DIAGNOSIS — N390 Urinary tract infection, site not specified: Secondary | ICD-10-CM | POA: Diagnosis not present

## 2015-08-21 DIAGNOSIS — D509 Iron deficiency anemia, unspecified: Secondary | ICD-10-CM | POA: Diagnosis not present

## 2015-08-21 DIAGNOSIS — F0390 Unspecified dementia without behavioral disturbance: Secondary | ICD-10-CM | POA: Diagnosis not present

## 2015-08-21 DIAGNOSIS — I10 Essential (primary) hypertension: Secondary | ICD-10-CM | POA: Diagnosis not present

## 2015-08-21 DIAGNOSIS — R1312 Dysphagia, oropharyngeal phase: Secondary | ICD-10-CM | POA: Diagnosis not present

## 2015-08-21 DIAGNOSIS — I4891 Unspecified atrial fibrillation: Secondary | ICD-10-CM | POA: Diagnosis not present

## 2015-08-22 DIAGNOSIS — F0390 Unspecified dementia without behavioral disturbance: Secondary | ICD-10-CM | POA: Diagnosis not present

## 2015-08-22 DIAGNOSIS — I4891 Unspecified atrial fibrillation: Secondary | ICD-10-CM | POA: Diagnosis not present

## 2015-08-22 DIAGNOSIS — R1312 Dysphagia, oropharyngeal phase: Secondary | ICD-10-CM | POA: Diagnosis not present

## 2015-08-22 DIAGNOSIS — I10 Essential (primary) hypertension: Secondary | ICD-10-CM | POA: Diagnosis not present

## 2015-08-22 DIAGNOSIS — N390 Urinary tract infection, site not specified: Secondary | ICD-10-CM | POA: Diagnosis not present

## 2015-08-22 DIAGNOSIS — D509 Iron deficiency anemia, unspecified: Secondary | ICD-10-CM | POA: Diagnosis not present

## 2015-08-23 DIAGNOSIS — N390 Urinary tract infection, site not specified: Secondary | ICD-10-CM | POA: Diagnosis not present

## 2015-08-23 DIAGNOSIS — I4891 Unspecified atrial fibrillation: Secondary | ICD-10-CM | POA: Diagnosis not present

## 2015-08-23 DIAGNOSIS — F0390 Unspecified dementia without behavioral disturbance: Secondary | ICD-10-CM | POA: Diagnosis not present

## 2015-08-23 DIAGNOSIS — I10 Essential (primary) hypertension: Secondary | ICD-10-CM | POA: Diagnosis not present

## 2015-08-23 DIAGNOSIS — D509 Iron deficiency anemia, unspecified: Secondary | ICD-10-CM | POA: Diagnosis not present

## 2015-08-23 DIAGNOSIS — R1312 Dysphagia, oropharyngeal phase: Secondary | ICD-10-CM | POA: Diagnosis not present

## 2015-08-26 DIAGNOSIS — I10 Essential (primary) hypertension: Secondary | ICD-10-CM | POA: Diagnosis not present

## 2015-08-26 DIAGNOSIS — F0390 Unspecified dementia without behavioral disturbance: Secondary | ICD-10-CM | POA: Diagnosis not present

## 2015-08-26 DIAGNOSIS — I4891 Unspecified atrial fibrillation: Secondary | ICD-10-CM | POA: Diagnosis not present

## 2015-08-26 DIAGNOSIS — D509 Iron deficiency anemia, unspecified: Secondary | ICD-10-CM | POA: Diagnosis not present

## 2015-08-26 DIAGNOSIS — R1312 Dysphagia, oropharyngeal phase: Secondary | ICD-10-CM | POA: Diagnosis not present

## 2015-08-26 DIAGNOSIS — N390 Urinary tract infection, site not specified: Secondary | ICD-10-CM | POA: Diagnosis not present

## 2015-08-27 DIAGNOSIS — N390 Urinary tract infection, site not specified: Secondary | ICD-10-CM | POA: Diagnosis not present

## 2015-08-27 DIAGNOSIS — R1312 Dysphagia, oropharyngeal phase: Secondary | ICD-10-CM | POA: Diagnosis not present

## 2015-08-27 DIAGNOSIS — I10 Essential (primary) hypertension: Secondary | ICD-10-CM | POA: Diagnosis not present

## 2015-08-27 DIAGNOSIS — F0390 Unspecified dementia without behavioral disturbance: Secondary | ICD-10-CM | POA: Diagnosis not present

## 2015-08-27 DIAGNOSIS — I4891 Unspecified atrial fibrillation: Secondary | ICD-10-CM | POA: Diagnosis not present

## 2015-08-27 DIAGNOSIS — D509 Iron deficiency anemia, unspecified: Secondary | ICD-10-CM | POA: Diagnosis not present

## 2015-08-28 DIAGNOSIS — I4891 Unspecified atrial fibrillation: Secondary | ICD-10-CM | POA: Diagnosis not present

## 2015-08-28 DIAGNOSIS — R1312 Dysphagia, oropharyngeal phase: Secondary | ICD-10-CM | POA: Diagnosis not present

## 2015-08-28 DIAGNOSIS — F0390 Unspecified dementia without behavioral disturbance: Secondary | ICD-10-CM | POA: Diagnosis not present

## 2015-08-28 DIAGNOSIS — D509 Iron deficiency anemia, unspecified: Secondary | ICD-10-CM | POA: Diagnosis not present

## 2015-08-28 DIAGNOSIS — N390 Urinary tract infection, site not specified: Secondary | ICD-10-CM | POA: Diagnosis not present

## 2015-08-28 DIAGNOSIS — R410 Disorientation, unspecified: Secondary | ICD-10-CM | POA: Diagnosis not present

## 2015-08-28 DIAGNOSIS — I10 Essential (primary) hypertension: Secondary | ICD-10-CM | POA: Diagnosis not present

## 2015-08-29 DIAGNOSIS — N390 Urinary tract infection, site not specified: Secondary | ICD-10-CM | POA: Diagnosis not present

## 2015-08-29 DIAGNOSIS — R1312 Dysphagia, oropharyngeal phase: Secondary | ICD-10-CM | POA: Diagnosis not present

## 2015-08-29 DIAGNOSIS — F0390 Unspecified dementia without behavioral disturbance: Secondary | ICD-10-CM | POA: Diagnosis not present

## 2015-08-29 DIAGNOSIS — I4891 Unspecified atrial fibrillation: Secondary | ICD-10-CM | POA: Diagnosis not present

## 2015-08-29 DIAGNOSIS — I10 Essential (primary) hypertension: Secondary | ICD-10-CM | POA: Diagnosis not present

## 2015-08-29 DIAGNOSIS — D509 Iron deficiency anemia, unspecified: Secondary | ICD-10-CM | POA: Diagnosis not present

## 2015-09-02 DIAGNOSIS — I4891 Unspecified atrial fibrillation: Secondary | ICD-10-CM | POA: Diagnosis not present

## 2015-09-02 DIAGNOSIS — F0390 Unspecified dementia without behavioral disturbance: Secondary | ICD-10-CM | POA: Diagnosis not present

## 2015-09-02 DIAGNOSIS — R1312 Dysphagia, oropharyngeal phase: Secondary | ICD-10-CM | POA: Diagnosis not present

## 2015-09-02 DIAGNOSIS — I10 Essential (primary) hypertension: Secondary | ICD-10-CM | POA: Diagnosis not present

## 2015-09-02 DIAGNOSIS — N390 Urinary tract infection, site not specified: Secondary | ICD-10-CM | POA: Diagnosis not present

## 2015-09-02 DIAGNOSIS — D509 Iron deficiency anemia, unspecified: Secondary | ICD-10-CM | POA: Diagnosis not present

## 2015-09-03 DIAGNOSIS — F0390 Unspecified dementia without behavioral disturbance: Secondary | ICD-10-CM | POA: Diagnosis not present

## 2015-09-03 DIAGNOSIS — R1312 Dysphagia, oropharyngeal phase: Secondary | ICD-10-CM | POA: Diagnosis not present

## 2015-09-03 DIAGNOSIS — D509 Iron deficiency anemia, unspecified: Secondary | ICD-10-CM | POA: Diagnosis not present

## 2015-09-03 DIAGNOSIS — I10 Essential (primary) hypertension: Secondary | ICD-10-CM | POA: Diagnosis not present

## 2015-09-03 DIAGNOSIS — N390 Urinary tract infection, site not specified: Secondary | ICD-10-CM | POA: Diagnosis not present

## 2015-09-03 DIAGNOSIS — I4891 Unspecified atrial fibrillation: Secondary | ICD-10-CM | POA: Diagnosis not present

## 2015-09-04 DIAGNOSIS — I4891 Unspecified atrial fibrillation: Secondary | ICD-10-CM | POA: Diagnosis not present

## 2015-09-04 DIAGNOSIS — N390 Urinary tract infection, site not specified: Secondary | ICD-10-CM | POA: Diagnosis not present

## 2015-09-04 DIAGNOSIS — R1312 Dysphagia, oropharyngeal phase: Secondary | ICD-10-CM | POA: Diagnosis not present

## 2015-09-04 DIAGNOSIS — I10 Essential (primary) hypertension: Secondary | ICD-10-CM | POA: Diagnosis not present

## 2015-09-04 DIAGNOSIS — D509 Iron deficiency anemia, unspecified: Secondary | ICD-10-CM | POA: Diagnosis not present

## 2015-09-04 DIAGNOSIS — F0390 Unspecified dementia without behavioral disturbance: Secondary | ICD-10-CM | POA: Diagnosis not present

## 2015-09-05 DIAGNOSIS — I10 Essential (primary) hypertension: Secondary | ICD-10-CM | POA: Diagnosis not present

## 2015-09-05 DIAGNOSIS — I4891 Unspecified atrial fibrillation: Secondary | ICD-10-CM | POA: Diagnosis not present

## 2015-09-05 DIAGNOSIS — D509 Iron deficiency anemia, unspecified: Secondary | ICD-10-CM | POA: Diagnosis not present

## 2015-09-05 DIAGNOSIS — N390 Urinary tract infection, site not specified: Secondary | ICD-10-CM | POA: Diagnosis not present

## 2015-09-05 DIAGNOSIS — R1312 Dysphagia, oropharyngeal phase: Secondary | ICD-10-CM | POA: Diagnosis not present

## 2015-09-05 DIAGNOSIS — F0390 Unspecified dementia without behavioral disturbance: Secondary | ICD-10-CM | POA: Diagnosis not present

## 2015-09-18 ENCOUNTER — Encounter (HOSPITAL_COMMUNITY): Payer: Self-pay | Admitting: Emergency Medicine

## 2015-09-18 ENCOUNTER — Emergency Department (HOSPITAL_COMMUNITY): Payer: Medicare Other

## 2015-09-18 ENCOUNTER — Inpatient Hospital Stay (HOSPITAL_COMMUNITY)
Admission: EM | Admit: 2015-09-18 | Discharge: 2015-09-22 | DRG: 193 | Disposition: A | Discharge status: Home Health | Payer: Medicare Other | Attending: Internal Medicine | Admitting: Internal Medicine

## 2015-09-18 DIAGNOSIS — F0390 Unspecified dementia without behavioral disturbance: Secondary | ICD-10-CM | POA: Diagnosis not present

## 2015-09-18 DIAGNOSIS — Z79899 Other long term (current) drug therapy: Secondary | ICD-10-CM

## 2015-09-18 DIAGNOSIS — R4182 Altered mental status, unspecified: Secondary | ICD-10-CM | POA: Diagnosis not present

## 2015-09-18 DIAGNOSIS — N390 Urinary tract infection, site not specified: Secondary | ICD-10-CM | POA: Diagnosis not present

## 2015-09-18 DIAGNOSIS — J189 Pneumonia, unspecified organism: Secondary | ICD-10-CM | POA: Diagnosis not present

## 2015-09-18 DIAGNOSIS — Z87440 Personal history of urinary (tract) infections: Secondary | ICD-10-CM

## 2015-09-18 DIAGNOSIS — I4891 Unspecified atrial fibrillation: Secondary | ICD-10-CM | POA: Diagnosis not present

## 2015-09-18 DIAGNOSIS — I10 Essential (primary) hypertension: Secondary | ICD-10-CM | POA: Diagnosis present

## 2015-09-18 DIAGNOSIS — Y95 Nosocomial condition: Secondary | ICD-10-CM | POA: Diagnosis present

## 2015-09-18 DIAGNOSIS — G9340 Encephalopathy, unspecified: Secondary | ICD-10-CM | POA: Diagnosis not present

## 2015-09-18 DIAGNOSIS — E039 Hypothyroidism, unspecified: Secondary | ICD-10-CM | POA: Diagnosis present

## 2015-09-18 DIAGNOSIS — R410 Disorientation, unspecified: Secondary | ICD-10-CM | POA: Diagnosis present

## 2015-09-18 DIAGNOSIS — R531 Weakness: Secondary | ICD-10-CM | POA: Diagnosis not present

## 2015-09-18 DIAGNOSIS — Z96651 Presence of right artificial knee joint: Secondary | ICD-10-CM | POA: Diagnosis not present

## 2015-09-18 DIAGNOSIS — Z7901 Long term (current) use of anticoagulants: Secondary | ICD-10-CM

## 2015-09-18 DIAGNOSIS — R404 Transient alteration of awareness: Secondary | ICD-10-CM | POA: Diagnosis not present

## 2015-09-18 DIAGNOSIS — B9620 Unspecified Escherichia coli [E. coli] as the cause of diseases classified elsewhere: Secondary | ICD-10-CM | POA: Diagnosis present

## 2015-09-18 DIAGNOSIS — Z471 Aftercare following joint replacement surgery: Secondary | ICD-10-CM | POA: Diagnosis not present

## 2015-09-18 DIAGNOSIS — R509 Fever, unspecified: Secondary | ICD-10-CM | POA: Diagnosis not present

## 2015-09-18 LAB — DIFFERENTIAL
BAND NEUTROPHILS: 0 %
BLASTS: 0 %
Basophils Absolute: 0 10*3/uL (ref 0.0–0.1)
Basophils Relative: 0 %
EOS PCT: 0 %
Eosinophils Absolute: 0 10*3/uL (ref 0.0–0.7)
LYMPHS ABS: 0.6 10*3/uL — AB (ref 0.7–4.0)
LYMPHS PCT: 5 %
METAMYELOCYTES PCT: 0 %
MONOS PCT: 9 %
Monocytes Absolute: 1 10*3/uL (ref 0.1–1.0)
Myelocytes: 0 %
NEUTROS ABS: 9.4 10*3/uL — AB (ref 1.7–7.7)
NEUTROS PCT: 86 %
NRBC: 0 /100{WBCs}
Other: 0 %
Promyelocytes Absolute: 0 %

## 2015-09-18 LAB — CBC
HEMATOCRIT: 38.8 % (ref 36.0–46.0)
HEMOGLOBIN: 13.7 g/dL (ref 12.0–15.0)
MCH: 29.8 pg (ref 26.0–34.0)
MCHC: 35.3 g/dL (ref 30.0–36.0)
MCV: 84.3 fL (ref 78.0–100.0)
Platelets: 265 10*3/uL (ref 150–400)
RBC: 4.6 MIL/uL (ref 3.87–5.11)
RDW: 14.6 % (ref 11.5–15.5)
WBC: 11 10*3/uL — ABNORMAL HIGH (ref 4.0–10.5)

## 2015-09-18 LAB — COMPREHENSIVE METABOLIC PANEL
ALBUMIN: 4 g/dL (ref 3.5–5.0)
ALK PHOS: 94 U/L (ref 38–126)
ALT: 21 U/L (ref 14–54)
AST: 20 U/L (ref 15–41)
Anion gap: 12 (ref 5–15)
BILIRUBIN TOTAL: 1 mg/dL (ref 0.3–1.2)
BUN: 24 mg/dL — ABNORMAL HIGH (ref 6–20)
CHLORIDE: 96 mmol/L — AB (ref 101–111)
CO2: 26 mmol/L (ref 22–32)
CREATININE: 0.97 mg/dL (ref 0.44–1.00)
Calcium: 9.3 mg/dL (ref 8.9–10.3)
GFR calc Af Amer: 57 mL/min — ABNORMAL LOW (ref >60)
GFR, EST NON AFRICAN AMERICAN: 49 mL/min — AB (ref >60)
Glucose, Bld: 141 mg/dL — ABNORMAL HIGH (ref 65–99)
POTASSIUM: 4.2 mmol/L (ref 3.5–5.1)
Sodium: 134 mmol/L — ABNORMAL LOW (ref 135–145)
Total Protein: 7.7 g/dL (ref 6.5–8.1)

## 2015-09-18 LAB — I-STAT TROPONIN, ED: Troponin i, poc: 0.02 ng/mL (ref 0.00–0.08)

## 2015-09-18 LAB — I-STAT CG4 LACTIC ACID, ED: LACTIC ACID, VENOUS: 0.83 mmol/L (ref 0.5–2.0)

## 2015-09-18 MED ORDER — APIXABAN 2.5 MG PO TABS
2.5000 mg | ORAL_TABLET | Freq: Two times a day (BID) | ORAL | Status: DC
  Administered 2015-09-19 – 2015-09-22 (×8): 2.5 mg via ORAL
  Filled 2015-09-18 (×11): qty 1

## 2015-09-18 MED ORDER — DEXTROSE 5 % IV SOLN: 2.0000 g | Freq: Once | INTRAVENOUS | Status: DC

## 2015-09-18 MED ORDER — LEVOTHYROXINE SODIUM 25 MCG PO TABS
25.0000 ug | ORAL_TABLET | Freq: Every day | ORAL | Status: DC
  Administered 2015-09-19 – 2015-09-22 (×4): 25 ug via ORAL
  Filled 2015-09-18 (×5): qty 1

## 2015-09-18 MED ORDER — DEXTROSE 5 % IV SOLN: 1.0000 g | INTRAVENOUS | Status: DC

## 2015-09-18 MED ORDER — SODIUM CHLORIDE 0.9 % IV BOLUS (SEPSIS)
500.0000 mL | Freq: Once | INTRAVENOUS | Status: AC
  Administered 2015-09-18: 500 mL via INTRAVENOUS

## 2015-09-18 MED ORDER — LATANOPROST 0.005 % OP SOLN
1.0000 [drp] | Freq: Every day | OPHTHALMIC | Status: DC
  Administered 2015-09-19 – 2015-09-21 (×4): 1 [drp] via OPHTHALMIC
  Filled 2015-09-18: qty 2.5

## 2015-09-18 MED ORDER — AMLODIPINE BESYLATE 10 MG PO TABS
10.0000 mg | ORAL_TABLET | Freq: Every day | ORAL | Status: DC
  Administered 2015-09-19 – 2015-09-21 (×4): 10 mg via ORAL
  Filled 2015-09-18 (×5): qty 1

## 2015-09-18 MED ORDER — POLYSACCHARIDE IRON COMPLEX 150 MG PO CAPS
150.0000 mg | ORAL_CAPSULE | Freq: Every day | ORAL | Status: DC
  Administered 2015-09-19 – 2015-09-22 (×4): 150 mg via ORAL
  Filled 2015-09-18 (×4): qty 1

## 2015-09-18 MED ORDER — ACETAMINOPHEN 500 MG PO TABS: 500.0000 mg | ORAL_TABLET | Freq: Four times a day (QID) | ORAL | Status: DC | PRN

## 2015-09-18 MED ORDER — DONEPEZIL HCL 10 MG PO TABS
10.0000 mg | ORAL_TABLET | Freq: Every day | ORAL | Status: DC
  Administered 2015-09-19 – 2015-09-21 (×4): 10 mg via ORAL
  Filled 2015-09-18 (×5): qty 1

## 2015-09-18 MED ORDER — DEXTROSE 5 % IV SOLN
1.0000 g | INTRAVENOUS | Status: DC
  Administered 2015-09-19: 1 g via INTRAVENOUS
  Filled 2015-09-18: qty 10

## 2015-09-18 MED ORDER — LORATADINE 10 MG PO TABS
10.0000 mg | ORAL_TABLET | Freq: Every day | ORAL | Status: DC
  Administered 2015-09-19 – 2015-09-21 (×3): 10 mg via ORAL
  Filled 2015-09-18 (×4): qty 1

## 2015-09-18 MED ORDER — VANCOMYCIN HCL IN DEXTROSE 1-5 GM/200ML-% IV SOLN
1000.0000 mg | INTRAVENOUS | Status: DC
  Administered 2015-09-19: 1000 mg via INTRAVENOUS
  Filled 2015-09-18: qty 200

## 2015-09-18 MED ORDER — MEMANTINE HCL 10 MG PO TABS
10.0000 mg | ORAL_TABLET | Freq: Every day | ORAL | Status: DC
  Administered 2015-09-19 – 2015-09-21 (×4): 10 mg via ORAL
  Filled 2015-09-18 (×5): qty 1

## 2015-09-18 MED ORDER — DEXTROSE 5 % IV SOLN
500.0000 mg | INTRAVENOUS | Status: DC
  Administered 2015-09-19: 500 mg via INTRAVENOUS
  Filled 2015-09-18: qty 500

## 2015-09-18 NOTE — ED Notes (Signed)
Pt presents to ED via [facility] for decrease LOC since yesterday, associated with nausea/vomiting and fever. Hx of dementia. Per EMS Temp. 100.4, HR-88, BP-160/70, CBG-140, SpO2-92% on room air. Pt desat to 88% on room air, placed on 2L [place] O2-93%. Pt alerts and oriented x3 at this time. No focal deficits noted.

## 2015-09-18 NOTE — ED Notes (Signed)
1 set of bloody culture drawn and sent to lab.

## 2015-09-18 NOTE — ED Provider Notes (Signed)
CSN: [id number]     Arrival date & time 09/18/15  1917 History   First MD Initiated Contact with Patient 09/18/15 2035     Chief Complaint  Patient presents with  . Altered Mental Status     (Consider location/radiation/quality/duration/timing/severity/associated sxs/prior Treatment) HPI   LEVEL 5 CAVEAT--Dementia.  History provided by daughter.  Katherine Miranda is a 82 y.o. female with PMH significant for NPH, HTN, dementia, arthritis, AF who presents with gradual onset, constant, worsening generalized weakness and lethargy since yesterday with associated nausea and fever (T 100.4 PTA).  No vomiting or diarrhea.  Patient denies any complaints.  En route, 92% on RA and desaturated to 88% and she was placed on 2L [place].  No oxygen requirement at home.  Patient normally ambulate with walker; however, over the last day she has been very weak and caregiver reports "she just slumps down", so they put her in a wheelchair.  Denies fall or LOC.  Daughter states that she has had surgery on her right knee and last week it seemed to be bothering her.  Per chart review: Patient admitted 07/15/15 for generalized weakness and UTI.    Past Medical History  Diagnosis Date  . Hypertension   . Dementia   . Arthritis   . A-fib [facility])    Past Surgical History  Procedure Laterality Date  . Knee arthroscopy    . Back surgery    . Cardiac catheterization N/A 04/20/2015    Procedure: Temporary Pacemaker;  Surgeon: Lorretta Harp, MD;  Location: [place] [facility];  Service: Cardiovascular;  Laterality: N/A;   History reviewed. No pertinent family history. Social History  Substance Use Topics  . Smoking status: Never Smoker   . Smokeless tobacco: Never Used  . Alcohol Use: No   OB History    No data available     Review of Systems  Unable to perform ROS: Dementia      Allergies  Review of patient's allergies indicates no known allergies.  Home Medications   Prior to Admission medications    Medication Sig Start Date End Date Taking? Authorizing Provider  acetaminophen (TYLENOL) 500 MG tablet Take 500-1,000 mg by mouth every 6 (six) hours as needed for moderate pain.   Yes Historical Provider, MD  alendronate (FOSAMAX) 70 MG tablet Take 70 mg by mouth every Tuesday. Take with a full glass of water on an empty stomach.   Yes Historical Provider, MD  amLODipine (NORVASC) 10 MG tablet Take 10 mg by mouth at bedtime.   Yes Historical Provider, MD  Ascorbic Acid (VITAMIN C) 1000 MG tablet Take 1,000 mg by mouth 2 (two) times daily.    Yes Historical Provider, MD  [place], Uva-Ursi, (UVA URSI PO) Take 1 tablet by mouth daily.   Yes Historical Provider, MD  CALCIUM PO Take 1 tablet by mouth 2 (two) times daily.    Yes Historical Provider, MD  Cholecalciferol (VITAMIN D) 2000 UNITS tablet Take 2,000 Units by mouth 2 (two) times daily.   Yes Historical Provider, MD  Coenzyme Q10 (COQ10) 200 MG CAPS Take 200 mg by mouth daily.   Yes Historical Provider, MD  CRANBERRY PO Take 300 mg by mouth daily.    Yes Historical Provider, MD  donepezil (ARICEPT) 10 MG tablet Take 10 mg by mouth at bedtime.   Yes Historical Provider, MD  ELIQUIS 2.5 MG TABS tablet TAKE 1 TABLET TWICE A DAY 12/28/14  Yes Evans Lance, MD  iron polysaccharides (  NIFEREX) 150 MG capsule Take 150 mg by mouth daily.   Yes Historical Provider, MD  levothyroxine (SYNTHROID, LEVOTHROID) 25 MCG tablet Take 1 tablet (25 mcg total) by mouth daily before breakfast. 04/24/15  Yes Ripudeep K Rai, MD  loratadine (CLARITIN) 10 MG tablet Take 10 mg by mouth at bedtime.   Yes Historical Provider, MD  memantine (NAMENDA) 10 MG tablet Take 10 mg by mouth at bedtime.    Yes Historical Provider, MD  Omega-3 Fatty Acids (FISH OIL) 1000 MG CAPS Take 1,000 mg by mouth daily.   Yes Historical Provider, MD  Probiotic Product (PROBIOTIC PO) Take 1 tablet by mouth daily.   Yes Historical Provider, MD  Travoprost, BAK Free, (TRAVATAN) 0.004 % SOLN  ophthalmic solution Place 1 drop into both eyes at bedtime.   Yes Historical Provider, MD   BP 166/88 mmHg  Pulse 102  Temp(Src) 98.5 F (36.9 C) (Oral)  Resp 21  Wt 70.761 kg  SpO2 97% Physical Exam  Constitutional: She appears well-developed and well-nourished.  HENT:  Head: Normocephalic and atraumatic.  Mouth/Throat: Oropharynx is clear and moist.  Eyes: Conjunctivae are normal. Pupils are equal, round, and reactive to light.  Neck: Normal range of motion. Neck supple.  Cardiovascular: Normal rate, regular rhythm and normal heart sounds.   No murmur heard. Pulmonary/Chest: Effort normal and breath sounds normal. No accessory muscle usage or stridor. No respiratory distress. She has no wheezes. She has no rhonchi. She has no rales.  2 L [place].  Abdominal: Soft. Bowel sounds are normal. She exhibits no distension. There is no tenderness.  Musculoskeletal: Normal range of motion.  Lymphadenopathy:    She has no cervical adenopathy.  Neurological: She is alert.  Speech clear without dysarthria.  Skin: Skin is warm and dry.  Psychiatric: She has a normal mood and affect. Her behavior is normal.    ED Course  Procedures (including critical care time) Labs Review Labs Reviewed  COMPREHENSIVE METABOLIC PANEL - Abnormal; Notable for the following:    Sodium 134 (*)    Chloride 96 (*)    Glucose, Bld 141 (*)    BUN 24 (*)    GFR calc non Af Amer 49 (*)    GFR calc Af Amer 57 (*)    All other components within normal limits  CBC - Abnormal; Notable for the following:    WBC 11.0 (*)    All other components within normal limits  URINALYSIS, ROUTINE W REFLEX MICROSCOPIC (NOT AT [facility]) - Abnormal; Notable for the following:    APPearance TURBID (*)    Hgb urine dipstick MODERATE (*)    Protein, ur 100 (*)    Leukocytes, UA LARGE (*)    All other components within normal limits  DIFFERENTIAL - Abnormal; Notable for the following:    Neutro Abs 9.4 (*)    Lymphs Abs 0.6 (*)     All other components within normal limits  URINE MICROSCOPIC-ADD ON - Abnormal; Notable for the following:    Squamous Epithelial / LPF 0-5 (*)    Bacteria, UA MANY (*)    All other components within normal limits  URINE CULTURE  CULTURE, BLOOD (ROUTINE X 2)  CULTURE, BLOOD (ROUTINE X 2)  CULTURE, EXPECTORATED SPUTUM-ASSESSMENT  GRAM STAIN  HIV ANTIBODY (ROUTINE TESTING)  STREP PNEUMONIAE URINARY ANTIGEN  I-STAT TROPOININ, ED  I-STAT CG4 LACTIC ACID, ED  I-STAT CG4 LACTIC ACID, ED    Imaging Review Dg Chest 2 View  09/18/2015  CLINICAL DATA:  Altered mental status, weakness, fever and loss of appetite. EXAM: CHEST  2 VIEW COMPARISON:  04/21/2015.  04/20/2015.  04/15/2015.  10/26/2012. FINDINGS: There is left ventricular prominence. The aorta is unfolded. There is some volume loss and abnormal patchy density in both lower lobes when compared to previous studies, suggesting mild basilar pneumonia bilaterally. No upper lobe lung disease. No heart failure or effusion. No significant bone finding. IMPRESSION: Diminished lung volumes and patchy density in both lower lobes consistent with mild bilateral lower lobe pneumonia. Electronically Signed   By: Nelson Chimes M.D.   On: 09/18/2015 21:19   Dg Knee Complete 4 Views Right  09/18/2015  CLINICAL DATA:  Right leg weakness.  Right knee pain. EXAM: RIGHT KNEE - COMPLETE 4+ VIEW COMPARISON:  None. FINDINGS: Total right knee arthroplasty in expected alignment. No periprosthetic lucency. No fracture. There has been patellar resurfacing. No joint effusion. No focal soft tissue abnormality. IMPRESSION: Intact right knee total arthroplasty without complication. No acute bony abnormality. No joint effusion. Electronically Signed   By: Jeb Levering M.D.   On: 09/18/2015 21:17   Dg Hip Unilat With Pelvis 2-3 Views Right  09/18/2015  CLINICAL DATA:  Right leg weakness, progressive. EXAM: DG HIP (WITH OR WITHOUT PELVIS) 2-3V RIGHT COMPARISON:  None.  FINDINGS: The bones are under mineralized. The cortical margins of the bony pelvis are intact. No fracture. Pubic symphysis and sacroiliac joints are congruent. Both femoral heads are well-seated in the respective acetabula. Osteoarthritis of both hips, expected for age. Surgical hardware in the lower lumbar spine, partially included. IMPRESSION: Osteoarthritis of both hips.  No acute bony abnormality. Electronically Signed   By: Jeb Levering M.D.   On: 09/18/2015 21:16   I have personally reviewed and evaluated these images and lab results as part of my medical decision-making.   EKG Interpretation None      MDM   Final diagnoses:  HCAP (healthcare-associated pneumonia)   Patient presents with generalized weakness over the past day. Patient was recently hospitalized for UTI in February. Patient denies any current complaints at this time. On arrival, patient D sided to oxygen saturation 88% on room air. She was then placed on 2 L nasal cannula. Patient has no oxygen requirement met at home. Chest x-ray remarkable for patchy density in both lower lobes consistent with mild bilateral lower lobe pneumonia. Lactate 0.89. Mild leukocytosis with WBC 11. UA pending. Patient given IVF and started on IV cefepime and vanc for HCAP.  Admit to medicine, obs, Dr. Alcario Drought, for IV abx.   Case has been discussed with Dr. Audie Pinto who agrees with the above plan for admission.      Gloriann Loan, PA-C 09/19/15 5638  Leonard Schwartz, MD 09/21/15 1102

## 2015-09-18 NOTE — ED Notes (Signed)
Bed: WA01 Expected date:  Expected time:  Means of arrival:  Comments: Ems-93 recurrent uti, ams

## 2015-09-19 DIAGNOSIS — E039 Hypothyroidism, unspecified: Secondary | ICD-10-CM | POA: Diagnosis present

## 2015-09-19 DIAGNOSIS — Z79899 Other long term (current) drug therapy: Secondary | ICD-10-CM | POA: Diagnosis not present

## 2015-09-19 DIAGNOSIS — F0390 Unspecified dementia without behavioral disturbance: Secondary | ICD-10-CM | POA: Diagnosis not present

## 2015-09-19 DIAGNOSIS — R410 Disorientation, unspecified: Secondary | ICD-10-CM | POA: Diagnosis not present

## 2015-09-19 DIAGNOSIS — I10 Essential (primary) hypertension: Secondary | ICD-10-CM | POA: Diagnosis not present

## 2015-09-19 DIAGNOSIS — Z7901 Long term (current) use of anticoagulants: Secondary | ICD-10-CM | POA: Diagnosis not present

## 2015-09-19 DIAGNOSIS — Y95 Nosocomial condition: Secondary | ICD-10-CM | POA: Diagnosis present

## 2015-09-19 DIAGNOSIS — G9340 Encephalopathy, unspecified: Secondary | ICD-10-CM | POA: Diagnosis present

## 2015-09-19 DIAGNOSIS — N390 Urinary tract infection, site not specified: Secondary | ICD-10-CM | POA: Diagnosis present

## 2015-09-19 DIAGNOSIS — J189 Pneumonia, unspecified organism: Principal | ICD-10-CM

## 2015-09-19 DIAGNOSIS — B9620 Unspecified Escherichia coli [E. coli] as the cause of diseases classified elsewhere: Secondary | ICD-10-CM | POA: Diagnosis present

## 2015-09-19 DIAGNOSIS — I4891 Unspecified atrial fibrillation: Secondary | ICD-10-CM | POA: Diagnosis present

## 2015-09-19 DIAGNOSIS — R7881 Bacteremia: Secondary | ICD-10-CM | POA: Diagnosis not present

## 2015-09-19 DIAGNOSIS — N3000 Acute cystitis without hematuria: Secondary | ICD-10-CM | POA: Diagnosis not present

## 2015-09-19 DIAGNOSIS — R4182 Altered mental status, unspecified: Secondary | ICD-10-CM | POA: Diagnosis not present

## 2015-09-19 DIAGNOSIS — Z87440 Personal history of urinary (tract) infections: Secondary | ICD-10-CM | POA: Diagnosis not present

## 2015-09-19 LAB — URINE MICROSCOPIC-ADD ON

## 2015-09-19 LAB — MRSA PCR SCREENING: MRSA BY PCR: NEGATIVE

## 2015-09-19 LAB — URINALYSIS, ROUTINE W REFLEX MICROSCOPIC
Bilirubin Urine: NEGATIVE
Glucose, UA: NEGATIVE mg/dL
Ketones, ur: NEGATIVE mg/dL
NITRITE: NEGATIVE
Protein, ur: 100 mg/dL — AB
SPECIFIC GRAVITY, URINE: 1.01 (ref 1.005–1.030)
pH: 7.5 (ref 5.0–8.0)

## 2015-09-19 LAB — I-STAT CG4 LACTIC ACID, ED: LACTIC ACID, VENOUS: 0.89 mmol/L (ref 0.5–2.0)

## 2015-09-19 LAB — HIV ANTIBODY (ROUTINE TESTING W REFLEX): HIV Screen 4th Generation wRfx: NONREACTIVE

## 2015-09-19 LAB — STREP PNEUMONIAE URINARY ANTIGEN: STREP PNEUMO URINARY ANTIGEN: NEGATIVE

## 2015-09-19 MED ORDER — SODIUM CHLORIDE 0.9 % IV SOLN
INTRAVENOUS | Status: DC
  Administered 2015-09-19 (×2): via INTRAVENOUS

## 2015-09-19 MED ORDER — SODIUM CHLORIDE 0.9 % IV SOLN
INTRAVENOUS | Status: DC
  Administered 2015-09-19 (×2): via INTRAVENOUS

## 2015-09-19 MED ORDER — CEFEPIME HCL 1 G IJ SOLR
1.0000 g | Freq: Two times a day (BID) | INTRAMUSCULAR | Status: AC
  Administered 2015-09-19 – 2015-09-21 (×5): 1 g via INTRAVENOUS
  Filled 2015-09-19 (×5): qty 1

## 2015-09-19 NOTE — Care Management Note (Signed)
Case Management Note  Patient Details  Name: Katherine Miranda MRN: [id number] Date of Birth: 1920-08-27  Subjective/Objective:           sepsis         Action/Plan:Date:  September 19, 2015 Chart reviewed for concurrent status and case management needs. Will continue to follow patient for changes and needs: Velva Harman, BSN, RN, [place]   [phone]   Expected Discharge Date:                  Expected Discharge Plan:  Skilled Nursing Facility  In-House Referral:  Clinical Social Work  Discharge planning Services  CM Consult  Post Acute Care Choice:  NA Choice offered to:  NA  DME Arranged:    DME Agency:     HH Arranged:    [place] Agency:     Status of Service:  In process, will continue to follow  Medicare Important Message Given:    Date Medicare IM Given:    Medicare IM give by:    Date Additional Medicare IM Given:    Additional Medicare Important Message give by:     If discussed at [place] of Stay Meetings, dates discussed:    Additional Comments:  Leeroy Cha, RN 09/19/2015, 2:20 PM

## 2015-09-19 NOTE — H&P (Addendum)
[facility] History and Physical  Katherine Miranda [id number] DOB: July 29, 1920 DOA: 09/18/2015  Referring physician: EDP PCP: [name], MD   Chief Complaint: AMS, generalized weakness   HPI: Katherine Miranda is a 82 y.o. female brought in to hospital by family today with c/o generalized weakness, confusion worse than baseline dementia, and new onset fever today.  Tm at home of 100.4.  Patient has history of recurrent UTI with similar symptoms in past.  Ambulates with walker at baseline; however, unable to over past day or so.  Has nausea, no vomiting, no diarrhea.  Review of Systems: Systems reviewed.  As above, otherwise negative  Past Medical History  Diagnosis Date  . Hypertension   . Dementia   . Arthritis   . A-fib [facility])    Past Surgical History  Procedure Laterality Date  . Knee arthroscopy    . Back surgery    . Cardiac catheterization N/A 04/20/2015    Procedure: Temporary Pacemaker;  Surgeon: Lorretta Harp, MD;  Location: [place] [facility];  Service: Cardiovascular;  Laterality: N/A;   Social History:  reports that she has never smoked. She has never used smokeless tobacco. She reports that she does not drink alcohol or use illicit drugs.  No Known Allergies  History reviewed. No pertinent family history.   Prior to Admission medications   Medication Sig Start Date End Date Taking? Authorizing Provider  acetaminophen (TYLENOL) 500 MG tablet Take 500-1,000 mg by mouth every 6 (six) hours as needed for moderate pain.   Yes Historical Provider, MD  alendronate (FOSAMAX) 70 MG tablet Take 70 mg by mouth every Tuesday. Take with a full glass of water on an empty stomach.   Yes Historical Provider, MD  amLODipine (NORVASC) 10 MG tablet Take 10 mg by mouth at bedtime.   Yes Historical Provider, MD  Ascorbic Acid (VITAMIN C) 1000 MG tablet Take 1,000 mg by mouth 2 (two) times daily.    Yes Historical Provider, MD  [place], Uva-Ursi, (UVA URSI PO) Take  1 tablet by mouth daily.   Yes Historical Provider, MD  CALCIUM PO Take 1 tablet by mouth 2 (two) times daily.    Yes Historical Provider, MD  Cholecalciferol (VITAMIN D) 2000 UNITS tablet Take 2,000 Units by mouth 2 (two) times daily.   Yes Historical Provider, MD  Coenzyme Q10 (COQ10) 200 MG CAPS Take 200 mg by mouth daily.   Yes Historical Provider, MD  CRANBERRY PO Take 300 mg by mouth daily.    Yes Historical Provider, MD  donepezil (ARICEPT) 10 MG tablet Take 10 mg by mouth at bedtime.   Yes Historical Provider, MD  ELIQUIS 2.5 MG TABS tablet TAKE 1 TABLET TWICE A DAY 12/28/14  Yes Evans Lance, MD  iron polysaccharides (NIFEREX) 150 MG capsule Take 150 mg by mouth daily.   Yes Historical Provider, MD  levothyroxine (SYNTHROID, LEVOTHROID) 25 MCG tablet Take 1 tablet (25 mcg total) by mouth daily before breakfast. 04/24/15  Yes Ripudeep K Rai, MD  loratadine (CLARITIN) 10 MG tablet Take 10 mg by mouth at bedtime.   Yes Historical Provider, MD  memantine (NAMENDA) 10 MG tablet Take 10 mg by mouth at bedtime.    Yes Historical Provider, MD  Omega-3 Fatty Acids (FISH OIL) 1000 MG CAPS Take 1,000 mg by mouth daily.   Yes Historical Provider, MD  Probiotic Product (PROBIOTIC PO) Take 1 tablet by mouth daily.   Yes Historical Provider, MD  Travoprost, BAK Free, (TRAVATAN)  0.004 % SOLN ophthalmic solution Place 1 drop into both eyes at bedtime.   Yes Historical Provider, MD   Physical Exam: Filed Vitals:   09/18/15 2330 09/19/15 0018  BP: 158/78   Pulse: 91   Temp:  98.5 F (36.9 C)  Resp: 21     BP 158/78 mmHg  Pulse 91  Temp(Src) 98.5 F (36.9 C) (Oral)  Resp 21  Wt 70.761 kg (156 lb)  SpO2 98%  General Appearance:    Alert, oriented, no distress, appears stated age  Head:    Normocephalic, atraumatic  Eyes:    PERRL, EOMI, sclera non-icteric        Nose:   Nares without drainage or epistaxis. Mucosa, turbinates normal  Throat:   Moist mucous membranes. Oropharynx without  erythema or exudate.  Neck:   Supple. No carotid bruits.  No thyromegaly.  No lymphadenopathy.   Back:     No CVA tenderness, no spinal tenderness  Lungs:     Clear to auscultation bilaterally, without wheezes, rhonchi or rales  Chest wall:    No tenderness to palpitation  Heart:    Regular rate and rhythm without murmurs, gallops, rubs  Abdomen:     Soft, non-tender, nondistended, normal bowel sounds, no organomegaly  Genitalia:    deferred  Rectal:    deferred  Extremities:   No clubbing, cyanosis or edema.  Pulses:   2+ and symmetric all extremities  Skin:   Skin color, texture, turgor normal, no rashes or lesions  Lymph nodes:   Cervical, supraclavicular, and axillary nodes normal  Neurologic:   CNII-XII intact. Normal strength, sensation and reflexes      throughout    Labs on Admission:  Basic Metabolic Panel:  Recent Labs Lab 09/18/15 2020  NA 134*  K 4.2  CL 96*  CO2 26  GLUCOSE 141*  BUN 24*  CREATININE 0.97  CALCIUM 9.3   Liver Function Tests:  Recent Labs Lab 09/18/15 2020  AST 20  ALT 21  ALKPHOS 94  BILITOT 1.0  PROT 7.7  ALBUMIN 4.0   No results for input(s): LIPASE, AMYLASE in the last 168 hours. No results for input(s): AMMONIA in the last 168 hours. CBC:  Recent Labs Lab 09/18/15 2020  WBC 11.0*  NEUTROABS 9.4*  HGB 13.7  HCT 38.8  MCV 84.3  PLT 265   Cardiac Enzymes: No results for input(s): CKTOTAL, CKMB, CKMBINDEX, TROPONINI in the last 168 hours.  BNP (last 3 results) No results for input(s): PROBNP in the last 8760 hours. CBG: No results for input(s): GLUCAP in the last 168 hours.  Radiological Exams on Admission: Dg Chest 2 View  09/18/2015  CLINICAL DATA:  Altered mental status, weakness, fever and loss of appetite. EXAM: CHEST  2 VIEW COMPARISON:  04/21/2015.  04/20/2015.  04/15/2015.  10/26/2012. FINDINGS: There is left ventricular prominence. The aorta is unfolded. There is some volume loss and abnormal patchy density in  both lower lobes when compared to previous studies, suggesting mild basilar pneumonia bilaterally. No upper lobe lung disease. No heart failure or effusion. No significant bone finding. IMPRESSION: Diminished lung volumes and patchy density in both lower lobes consistent with mild bilateral lower lobe pneumonia. Electronically Signed   By: Nelson Chimes M.D.   On: 09/18/2015 21:19   Dg Knee Complete 4 Views Right  09/18/2015  CLINICAL DATA:  Right leg weakness.  Right knee pain. EXAM: RIGHT KNEE - COMPLETE 4+ VIEW COMPARISON:  None. FINDINGS: Total right  knee arthroplasty in expected alignment. No periprosthetic lucency. No fracture. There has been patellar resurfacing. No joint effusion. No focal soft tissue abnormality. IMPRESSION: Intact right knee total arthroplasty without complication. No acute bony abnormality. No joint effusion. Electronically Signed   By: Jeb Levering M.D.   On: 09/18/2015 21:17   Dg Hip Unilat With Pelvis 2-3 Views Right  09/18/2015  CLINICAL DATA:  Right leg weakness, progressive. EXAM: DG HIP (WITH OR WITHOUT PELVIS) 2-3V RIGHT COMPARISON:  None. FINDINGS: The bones are under mineralized. The cortical margins of the bony pelvis are intact. No fracture. Pubic symphysis and sacroiliac joints are congruent. Both femoral heads are well-seated in the respective acetabula. Osteoarthritis of both hips, expected for age. Surgical hardware in the lower lumbar spine, partially included. IMPRESSION: Osteoarthritis of both hips.  No acute bony abnormality. Electronically Signed   By: Jeb Levering M.D.   On: 09/18/2015 21:16    EKG: Independently reviewed.  Assessment/Plan Principal Problem:   Bilateral pneumonia Active Problems:   Dementia   HTN (hypertension)   Delirium   1. Bilateral PNA - UA pending as well 1. PNA pathway 2. CAP coverage 3. Adding vancomycin due to positive MRSA nasal swabs in Feb and November 4. Cultures pending 5. Tylenol PRN fever 6. Gentle  hydration 2. Dementia - 1. Continue home meds 3. Delirium - Due to #1 4. HTN - continue home meds 5. DVT ppx - patient on chronic eliquis for her h/o A.Fib   Code Status: Full Code per daughter  Family Communication: Daughter at bedside Disposition Plan: Admit to inpatient   Time spent: 65 min  GARDNER, JARED M. [facility] Pager [phone]  If 7AM-7PM, please contact the day team taking care of the patient [email or web address] Password [email or web address] 09/19/2015, 12:21 AM

## 2015-09-19 NOTE — Progress Notes (Signed)
Pharmacy Antibiotic Note  Katherine Miranda is a 82 y.o. female admitted on 09/18/2015 with UTI and HCAP.  Pharmacy has been consulted for cefepime dosing.  Plan: Cefepime 1gm IV q12h Follow renal function, cultures, clinical coarse  Height: 5\' 5"  (165.1 cm) Weight: 153 lb 3.5 oz (69.5 kg) IBW/kg (Calculated) : 57  Temp (24hrs), Avg:98.9 F (37.2 C), Min:98.2 F (36.8 C), Max:99.7 F (37.6 C)   Recent Labs Lab 09/18/15 2020 09/18/15 2031 09/19/15 0040  WBC 11.0*  --   --   CREATININE 0.97  --   --   LATICACIDVEN  --  0.83 0.89    Estimated Creatinine Clearance: 36.2 mL/min (by C-G formula based on Cr of 0.97).    No Known Allergies  Antimicrobials this admission: 4/20 vancomycin >> 4/20 4/20 zmax >> 4/20 4/20 rocephin >> 4/20  Dose adjustments this admission:   Microbiology results: 4/20 BCx: sent  4/20 UCx: sent  4/20 MRSA PCR: neg  Thank you for allowing pharmacy to be a part of this patient's care.  Dolly Rias RPh 09/19/2015, 2:56 PM Pager [phone]

## 2015-09-19 NOTE — Progress Notes (Signed)
Pharmacy Antibiotic Note  SVETLANA ILLA is a 82 y.o. female admitted on 09/18/2015 with pneumonia.  Pharmacy has been consulted for Vancomycin dosing.  Plan: Vancomycin 1 Gm IV q24h Zmax 500mg  IV q24h Rocephin 1Gm IV q24h  Weight: 156 lb (70.761 kg)  Temp (24hrs), Avg:99 F (37.2 C), Min:98.3 F (36.8 C), Max:99.7 F (37.6 C)   Recent Labs Lab 09/18/15 2020 09/18/15 2031  WBC 11.0*  --   CREATININE 0.97  --   LATICACIDVEN  --  0.83    Estimated Creatinine Clearance: 35.7 mL/min (by C-G formula based on Cr of 0.97).    No Known Allergies  Antimicrobials this admission: 4/19 vancomycin >>  4/20 zmax >>  4/20 rocephin >>  Dose adjustments this admission:   Microbiology results:  BCx:   UCx:    Sputum:    MRSA PCR:   Thank you for allowing pharmacy to be a part of this patient's care.  Dorrene German 09/19/2015 12:06 AM

## 2015-09-19 NOTE — Progress Notes (Addendum)
PROGRESS NOTE  Katherine Miranda  [id number] DOB: Feb 06, 1921  DOA: 09/18/2015 PCP: [name], MD  Outpatient Specialists:  Not known  Brief Narrative:  82 year old female patient, PMH of HTN, dementia, A. fib, recurrent UTIs, ambulates with a walker, presented to ED on 09/18/15 with complaints of generalized weakness, confusion worse than her baseline, fever of 100.45F. As per report, history of recurrent UTI with similar symptoms in the past. En route to the ED, 92% on room air and desaturated to 88% and was placed on 2 L [address] (not on home oxygen). Unable to walk with walker, due to weakness. No fall or LOC reported. Admitted for bilateral lower lobe pneumonia and UTI.  Assessment & Plan:   Principal Problem:   Bilateral pneumonia Active Problems:   Dementia   HTN (hypertension)   Delirium   UTI (urinary tract infection)   Healthcare associated pneumonia/bilateral lower lobe - Empirically started on IV Rocephin, azithromycin and vancomycin. Since patient was hospitalized <90 days ago, will broaden antibiotic spectrum: DC Rocephin and azithromycin and start IV cefepime. MRSA PCR negative and hence discontinue vancomycin. - Recommend follow-up chest x-ray in 3-4 weeks to ensure resolution of pneumonia findings. - During prior hospitalization, patient's diet was upgraded by speech therapy to regular consistency diet.  UTI, recurrent - Continue cefepime.  Acute encephalopathy - Secondary to acute infectious process complicating underlying dementia. No agitation.  Essential hypertension - Controlled. Amlodipine.  A. Fib - Rate controlled. Continue Eliquis.  Dementia - Continue Namenda  Hypothyroid - Continue levothyroxine.     DVT prophylaxis: on Eliquis AC Code Status: Full Family Communication: None at bedside. Discussed with patient. Disposition Plan: DC home when medically stable, possibly in 48 hours.   Consultants:   None  Procedures:    None  Antimicrobials:   Azithromycin 1 dose  Ceftriaxone 1 dose  Vancomycin 1 dose   Subjective: Denies complaints. Denies cough, dyspnea, dysuria or urinary frequency. As per RN, no acute issues.  Objective:  Filed Vitals:   09/19/15 0030 09/19/15 0100 09/19/15 0527 09/19/15 1444  BP: 166/88 146/71 153/69 104/58  Pulse: 102 92 81 57  Temp:  99.5 F (37.5 C) 98.9 F (37.2 C) 98.2 F (36.8 C)  TempSrc:  Oral Oral Oral  Resp: 21 22 21 20   Height:  5\' 5"  (1.651 m)    Weight:  69.5 kg (153 lb 3.5 oz)    SpO2: 97% 100% 96% 100%    Intake/Output Summary (Last 24 hours) at 09/19/15 1451 Last data filed at 09/19/15 0900  Gross per 24 hour  Intake 856.25 ml  Output      7 ml  Net 849.25 ml   Filed Weights   09/18/15 1945 09/19/15 0100  Weight: 70.761 kg (156 lb) 69.5 kg (153 lb 3.5 oz)    Examination:  General exam: Pleasant elderly female sitting up comfortably in bed eating breakfast this morning. Respiratory system: Diminished breath sounds in the bases but otherwise clear to auscultation. Respiratory effort normal. Cardiovascular system: S1 & S2 heard, RRR.Marland Kitchen No JVD, murmurs, rubs, gallops or clicks. No pedal edema. Gastrointestinal system: Abdomen is nondistended, soft and nontender. No organomegaly or masses felt. Normal bowel sounds heard. Central nervous system: Alert and oriented to self and partly to place. No focal neurological deficits. Extremities: Symmetric 5 x 5 power. Skin: No rashes, lesions or ulcers Psychiatry: Judgement and insight appear normal. Mood & affect appropriate.     Data Reviewed: I have personally reviewed following labs and imaging  studies  CBC:  Recent Labs Lab 09/18/15 2020  WBC 11.0*  NEUTROABS 9.4*  HGB 13.7  HCT 38.8  MCV 84.3  PLT 99991111   Basic Metabolic Panel:  Recent Labs Lab 09/18/15 2020  NA 134*  K 4.2  CL 96*  CO2 26  GLUCOSE 141*  BUN 24*  CREATININE 0.97  CALCIUM 9.3   GFR: Estimated  Creatinine Clearance: 36.2 mL/min (by C-G formula based on Cr of 0.97). Liver Function Tests:  Recent Labs Lab 09/18/15 2020  AST 20  ALT 21  ALKPHOS 94  BILITOT 1.0  PROT 7.7  ALBUMIN 4.0   No results for input(s): LIPASE, AMYLASE in the last 168 hours. No results for input(s): AMMONIA in the last 168 hours. Coagulation Profile: No results for input(s): INR, PROTIME in the last 168 hours. Cardiac Enzymes: No results for input(s): CKTOTAL, CKMB, CKMBINDEX, TROPONINI in the last 168 hours. BNP (last 3 results) No results for input(s): PROBNP in the last 8760 hours. HbA1C: No results for input(s): HGBA1C in the last 72 hours. CBG: No results for input(s): GLUCAP in the last 168 hours. Lipid Profile: No results for input(s): CHOL, HDL, LDLCALC, TRIG, CHOLHDL, LDLDIRECT in the last 72 hours. Thyroid Function Tests: No results for input(s): TSH, T4TOTAL, FREET4, T3FREE, THYROIDAB in the last 72 hours. Anemia Panel: No results for input(s): VITAMINB12, FOLATE, FERRITIN, TIBC, IRON, RETICCTPCT in the last 72 hours. Urine analysis:    Component Value Date/Time   COLORURINE YELLOW 09/18/2015 2358   APPEARANCEUR TURBID* 09/18/2015 2358   LABSPEC 1.010 09/18/2015 2358   PHURINE 7.5 09/18/2015 2358   GLUCOSEU NEGATIVE 09/18/2015 2358   HGBUR MODERATE* 09/18/2015 2358   BILIRUBINUR NEGATIVE 09/18/2015 2358   KETONESUR NEGATIVE 09/18/2015 2358   PROTEINUR 100* 09/18/2015 2358   UROBILINOGEN 0.2 04/15/2015 1509   NITRITE NEGATIVE 09/18/2015 2358   LEUKOCYTESUR LARGE* 09/18/2015 2358   Sepsis Labs: @LABRCNTIP (procalcitonin:4,lacticidven:4)  ) Recent Results (from the past 240 hour(s))  Blood Culture (routine x 2)     Status: None (Preliminary result)   Collection Time: 09/19/15 12:22 AM  Result Value Ref Range Status   Specimen Description   Final    BLOOD LEFT ANTECUBITAL Performed at [facility]    Special Requests BOTTLES DRAWN AEROBIC AND ANAEROBIC 5CC  Final    Culture PENDING  Incomplete   Report Status PENDING  Incomplete  MRSA PCR Screening     Status: None   Collection Time: 09/19/15  1:42 AM  Result Value Ref Range Status   MRSA by PCR NEGATIVE NEGATIVE Final    Comment:        The GeneXpert MRSA Assay (FDA approved for NASAL specimens only), is one component of a comprehensive MRSA colonization surveillance program. It is not intended to diagnose MRSA infection nor to guide or monitor treatment for MRSA infections.          Radiology Studies: Dg Chest 2 View  09/18/2015  CLINICAL DATA:  Altered mental status, weakness, fever and loss of appetite. EXAM: CHEST  2 VIEW COMPARISON:  04/21/2015.  04/20/2015.  04/15/2015.  10/26/2012. FINDINGS: There is left ventricular prominence. The aorta is unfolded. There is some volume loss and abnormal patchy density in both lower lobes when compared to previous studies, suggesting mild basilar pneumonia bilaterally. No upper lobe lung disease. No heart failure or effusion. No significant bone finding. IMPRESSION: Diminished lung volumes and patchy density in both lower lobes consistent with mild bilateral lower lobe pneumonia. Electronically  Signed   By: Nelson Chimes M.D.   On: 09/18/2015 21:19   Dg Knee Complete 4 Views Right  09/18/2015  CLINICAL DATA:  Right leg weakness.  Right knee pain. EXAM: RIGHT KNEE - COMPLETE 4+ VIEW COMPARISON:  None. FINDINGS: Total right knee arthroplasty in expected alignment. No periprosthetic lucency. No fracture. There has been patellar resurfacing. No joint effusion. No focal soft tissue abnormality. IMPRESSION: Intact right knee total arthroplasty without complication. No acute bony abnormality. No joint effusion. Electronically Signed   By: Jeb Levering M.D.   On: 09/18/2015 21:17   Dg Hip Unilat With Pelvis 2-3 Views Right  09/18/2015  CLINICAL DATA:  Right leg weakness, progressive. EXAM: DG HIP (WITH OR WITHOUT PELVIS) 2-3V RIGHT COMPARISON:  None.  FINDINGS: The bones are under mineralized. The cortical margins of the bony pelvis are intact. No fracture. Pubic symphysis and sacroiliac joints are congruent. Both femoral heads are well-seated in the respective acetabula. Osteoarthritis of both hips, expected for age. Surgical hardware in the lower lumbar spine, partially included. IMPRESSION: Osteoarthritis of both hips.  No acute bony abnormality. Electronically Signed   By: Jeb Levering M.D.   On: 09/18/2015 21:16        Scheduled Meds: . amLODipine  10 mg Oral QHS  . apixaban  2.5 mg Oral BID  . donepezil  10 mg Oral QHS  . iron polysaccharides  150 mg Oral Daily  . latanoprost  1 drop Both Eyes QHS  . levothyroxine  25 mcg Oral QAC breakfast  . loratadine  10 mg Oral QHS  . memantine  10 mg Oral QHS   Continuous Infusions: . sodium chloride       LOS: 0 days    Time spent: 35 minutes.    [facility], MD [facility] Pager [phone]  If 7PM-7AM, please contact night-coverage [email or web address] Password [email or web address] 09/19/2015, 2:51 PM

## 2015-09-20 DIAGNOSIS — I10 Essential (primary) hypertension: Secondary | ICD-10-CM

## 2015-09-20 DIAGNOSIS — R7881 Bacteremia: Secondary | ICD-10-CM

## 2015-09-20 DIAGNOSIS — N3000 Acute cystitis without hematuria: Secondary | ICD-10-CM

## 2015-09-20 DIAGNOSIS — F0390 Unspecified dementia without behavioral disturbance: Secondary | ICD-10-CM

## 2015-09-20 LAB — BASIC METABOLIC PANEL
ANION GAP: 10 (ref 5–15)
BUN: 17 mg/dL (ref 6–20)
CO2: 24 mmol/L (ref 22–32)
Calcium: 7.9 mg/dL — ABNORMAL LOW (ref 8.9–10.3)
Chloride: 98 mmol/L — ABNORMAL LOW (ref 101–111)
Creatinine, Ser: 0.88 mg/dL (ref 0.44–1.00)
GFR, EST NON AFRICAN AMERICAN: 55 mL/min — AB (ref >60)
Glucose, Bld: 113 mg/dL — ABNORMAL HIGH (ref 65–99)
POTASSIUM: 3.7 mmol/L (ref 3.5–5.1)
SODIUM: 132 mmol/L — AB (ref 135–145)

## 2015-09-20 NOTE — Progress Notes (Signed)
Daughter [name] called, and wishes to speak to the doctor sometime today.  Her number is [phone].

## 2015-09-20 NOTE — Progress Notes (Signed)
PROGRESS NOTE  Katherine Miranda  [id number] DOB: 1921-05-26  DOA: 09/18/2015 PCP: [name], MD  Outpatient Specialists:  Not known  Brief Narrative:  82 year old female patient, PMH of HTN, dementia, A. fib, recurrent UTIs, ambulates with a walker, presented to ED on 09/18/15 with complaints of generalized weakness, confusion worse than her baseline, fever of 100.12F. As per report, history of recurrent UTI with similar symptoms in the past. En route to the ED, 92% on room air and desaturated to 88% and was placed on 2 L [place] (not on home oxygen). Unable to walk with walker, due to weakness. No fall or LOC reported. Admitted for bilateral lower lobe pneumonia and UTI.  Assessment & Plan:   Principal Problem:   Bilateral pneumonia Active Problems:   Dementia   HTN (hypertension)   Delirium   UTI (urinary tract infection)   Healthcare associated pneumonia/bilateral lower lobe - Initially empirically started on IV Rocephin, azithromycin and vancomycin. Since patient was hospitalized <90 days ago, broadened antibiotic spectrum: DC'ed Rocephin and azithromycin and started IV cefepime. MRSA PCR negative and hence discontinued vancomycin. - Recommend follow-up chest x-ray in 3-4 weeks to ensure resolution of pneumonia findings. - During prior hospitalization, patient's diet was upgraded by speech therapy to regular consistency diet.  Escherichia coli UTI (recurrent) with bacteremia - Continue cefepime pending final sensitivities. - Blood cultures 2: Gram-negative rods. Urine culture 1: Escherichia coli  Acute encephalopathy - Secondary to acute infectious process complicating underlying dementia. No agitation. As per daughter, mental status improved and close to baseline.  Essential hypertension - Controlled. Amlodipine.  A. Fib - Rate controlled. Continue Eliquis.  Dementia, moderate - Continue Namenda  Hypothyroid - Continue levothyroxine.     DVT prophylaxis: on  Eliquis AC Code Status: Full Family Communication: Discussed with patient's daughter Ms. [name] at length. Updated care and answered questions. Disposition Plan: DC home when medically stable, possibly in 24-48 hours. PTA: single, lives at home and has 24/7 assistance and care and ambulates with walker. PT evaluation to decide discharge location.   Consultants:   None  Procedures:   None  Antimicrobials:   Azithromycin 1 dose  Ceftriaxone 1 dose  Vancomycin 1 dose  Cefepime 4/20 >   Subjective: Denies complaints. Denies cough, dyspnea, dysuria or urinary frequency. As per RN, no acute issues. Pleasantly confused.  Objective:  Filed Vitals:   09/19/15 0527 09/19/15 1444 09/19/15 2227 09/20/15 0456  BP: 153/69 104/58 139/70 133/53  Pulse: 81 57 65 73  Temp: 98.9 F (37.2 C) 98.2 F (36.8 C) 97.7 F (36.5 C) 98 F (36.7 C)  TempSrc: Oral Oral Oral Oral  Resp: 21 20 19 19   Height:      Weight:      SpO2: 96% 100% 99% 94%    Intake/Output Summary (Last 24 hours) at 09/20/15 1254 Last data filed at 09/20/15 [id number]  Gross per 24 hour  Intake 1556.67 ml  Output      2 ml  Net 1554.67 ml   Filed Weights   09/18/15 1945 09/19/15 0100  Weight: 70.761 kg (156 lb) 69.5 kg (153 lb 3.5 oz)    Examination:  General exam: Pleasant elderly female sitting up comfortably in bed. Respiratory system: Diminished breath sounds in the bases but otherwise clear to auscultation. Respiratory effort normal. Cardiovascular system: S1 & S2 heard, RRR.Marland Kitchen No JVD, murmurs, rubs, gallops or clicks. No pedal edema. Gastrointestinal system: Abdomen is nondistended, soft and nontender. No organomegaly or masses felt. Normal  bowel sounds heard. Central nervous system: Alert and oriented to self only. No focal neurological deficits. Extremities: Symmetric 5 x 5 power. Skin: No rashes, lesions or ulcers Psychiatry: Judgement and insight appear normal. Mood & affect appropriate.      Data Reviewed: I have personally reviewed following labs and imaging studies  CBC:  Recent Labs Lab 09/18/15 2020  WBC 11.0*  NEUTROABS 9.4*  HGB 13.7  HCT 38.8  MCV 84.3  PLT 99991111   Basic Metabolic Panel:  Recent Labs Lab 09/18/15 2020 09/20/15 0522  NA 134* 132*  K 4.2 3.7  CL 96* 98*  CO2 26 24  GLUCOSE 141* 113*  BUN 24* 17  CREATININE 0.97 0.88  CALCIUM 9.3 7.9*   GFR: Estimated Creatinine Clearance: 39.9 mL/min (by C-G formula based on Cr of 0.88). Liver Function Tests:  Recent Labs Lab 09/18/15 2020  AST 20  ALT 21  ALKPHOS 94  BILITOT 1.0  PROT 7.7  ALBUMIN 4.0   No results for input(s): LIPASE, AMYLASE in the last 168 hours. No results for input(s): AMMONIA in the last 168 hours. Coagulation Profile: No results for input(s): INR, PROTIME in the last 168 hours. Cardiac Enzymes: No results for input(s): CKTOTAL, CKMB, CKMBINDEX, TROPONINI in the last 168 hours. BNP (last 3 results) No results for input(s): PROBNP in the last 8760 hours. HbA1C: No results for input(s): HGBA1C in the last 72 hours. CBG: No results for input(s): GLUCAP in the last 168 hours. Lipid Profile: No results for input(s): CHOL, HDL, LDLCALC, TRIG, CHOLHDL, LDLDIRECT in the last 72 hours. Thyroid Function Tests: No results for input(s): TSH, T4TOTAL, FREET4, T3FREE, THYROIDAB in the last 72 hours. Anemia Panel: No results for input(s): VITAMINB12, FOLATE, FERRITIN, TIBC, IRON, RETICCTPCT in the last 72 hours. Urine analysis:    Component Value Date/Time   COLORURINE YELLOW 09/18/2015 2358   APPEARANCEUR TURBID* 09/18/2015 2358   LABSPEC 1.010 09/18/2015 2358   PHURINE 7.5 09/18/2015 2358   GLUCOSEU NEGATIVE 09/18/2015 2358   HGBUR MODERATE* 09/18/2015 2358   BILIRUBINUR NEGATIVE 09/18/2015 2358   KETONESUR NEGATIVE 09/18/2015 2358   PROTEINUR 100* 09/18/2015 2358   UROBILINOGEN 0.2 04/15/2015 1509   NITRITE NEGATIVE 09/18/2015 2358   LEUKOCYTESUR LARGE*  09/18/2015 2358   Sepsis Labs: @LABRCNTIP (procalcitonin:4,lacticidven:4)  ) Recent Results (from the past 240 hour(s))  Blood Culture (routine x 2)     Status: None (Preliminary result)   Collection Time: 09/18/15  8:26 PM  Result Value Ref Range Status   Specimen Description BLOOD RIGHT HAND  Final   Special Requests BOTTLES DRAWN AEROBIC AND ANAEROBIC 10CC  Final   Culture  Setup Time   Final    GRAM NEGATIVE RODS IN BOTH AEROBIC AND ANAEROBIC BOTTLES CRITICAL RESULT CALLED TO, READ BACK BY AND VERIFIED WITH: Oval Linsey RN 16:40 09/19/15  ([email or web address]) Performed at [place]  Final   Report Status PENDING  Incomplete  Urine culture     Status: Abnormal (Preliminary result)   Collection Time: 09/18/15 10:25 PM  Result Value Ref Range Status   Specimen Description URINE, CATHETERIZED  Final   Special Requests NONE  Final   Culture >=100,000 COLONIES/mL ESCHERICHIA COLI (A)  Final   Report Status PENDING  Incomplete  Blood Culture (routine x 2)     Status: None (Preliminary result)   Collection Time: 09/19/15 12:22 AM  Result Value Ref Range Status   Specimen Description BLOOD LEFT ANTECUBITAL  Final   Special Requests BOTTLES DRAWN AEROBIC AND ANAEROBIC 5CC  Final   Culture  Setup Time   Final    GRAM NEGATIVE RODS IN BOTH AEROBIC AND ANAEROBIC BOTTLES CRITICAL RESULT CALLED TO, READ BACK BY AND VERIFIED WITH: R REID,RN @1953  09/19/15 Lavonna Monarch Performed at [facility]    Culture PENDING  Incomplete   Report Status PENDING  Incomplete  MRSA PCR Screening     Status: None   Collection Time: 09/19/15  1:42 AM  Result Value Ref Range Status   MRSA by PCR NEGATIVE NEGATIVE Final    Comment:        The GeneXpert MRSA Assay (FDA approved for NASAL specimens only), is one component of a comprehensive MRSA colonization surveillance program. It is not intended to diagnose MRSA infection nor to guide or monitor treatment for MRSA  infections.          Radiology Studies: Dg Chest 2 View  09/18/2015  CLINICAL DATA:  Altered mental status, weakness, fever and loss of appetite. EXAM: CHEST  2 VIEW COMPARISON:  04/21/2015.  04/20/2015.  04/15/2015.  10/26/2012. FINDINGS: There is left ventricular prominence. The aorta is unfolded. There is some volume loss and abnormal patchy density in both lower lobes when compared to previous studies, suggesting mild basilar pneumonia bilaterally. No upper lobe lung disease. No heart failure or effusion. No significant bone finding. IMPRESSION: Diminished lung volumes and patchy density in both lower lobes consistent with mild bilateral lower lobe pneumonia. Electronically Signed   By: Nelson Chimes M.D.   On: 09/18/2015 21:19   Dg Knee Complete 4 Views Right  09/18/2015  CLINICAL DATA:  Right leg weakness.  Right knee pain. EXAM: RIGHT KNEE - COMPLETE 4+ VIEW COMPARISON:  None. FINDINGS: Total right knee arthroplasty in expected alignment. No periprosthetic lucency. No fracture. There has been patellar resurfacing. No joint effusion. No focal soft tissue abnormality. IMPRESSION: Intact right knee total arthroplasty without complication. No acute bony abnormality. No joint effusion. Electronically Signed   By: Jeb Levering M.D.   On: 09/18/2015 21:17   Dg Hip Unilat With Pelvis 2-3 Views Right  09/18/2015  CLINICAL DATA:  Right leg weakness, progressive. EXAM: DG HIP (WITH OR WITHOUT PELVIS) 2-3V RIGHT COMPARISON:  None. FINDINGS: The bones are under mineralized. The cortical margins of the bony pelvis are intact. No fracture. Pubic symphysis and sacroiliac joints are congruent. Both femoral heads are well-seated in the respective acetabula. Osteoarthritis of both hips, expected for age. Surgical hardware in the lower lumbar spine, partially included. IMPRESSION: Osteoarthritis of both hips.  No acute bony abnormality. Electronically Signed   By: Jeb Levering M.D.   On: 09/18/2015 21:16         Scheduled Meds: . amLODipine  10 mg Oral QHS  . apixaban  2.5 mg Oral BID  . ceFEPime (MAXIPIME) IV  1 g Intravenous Q12H  . donepezil  10 mg Oral QHS  . iron polysaccharides  150 mg Oral Daily  . latanoprost  1 drop Both Eyes QHS  . levothyroxine  25 mcg Oral QAC breakfast  . loratadine  10 mg Oral QHS  . memantine  10 mg Oral QHS   Continuous Infusions:     LOS: 1 day    Time spent: 25 minutes.    [facility], MD [facility] Pager [phone]  If 7PM-7AM, please contact night-coverage [email or web address] Password [facility] 09/20/2015, 12:54 PM

## 2015-09-21 LAB — CULTURE, BLOOD (ROUTINE X 2)

## 2015-09-21 LAB — BASIC METABOLIC PANEL
Anion gap: 9 (ref 5–15)
BUN: 16 mg/dL (ref 6–20)
CHLORIDE: 101 mmol/L (ref 101–111)
CO2: 29 mmol/L (ref 22–32)
CREATININE: 0.82 mg/dL (ref 0.44–1.00)
Calcium: 8.6 mg/dL — ABNORMAL LOW (ref 8.9–10.3)
GFR calc Af Amer: >60 mL/min (ref >60)
GFR calc non Af Amer: >60 mL/min (ref >60)
Glucose, Bld: 122 mg/dL — ABNORMAL HIGH (ref 65–99)
Potassium: 3.9 mmol/L (ref 3.5–5.1)
Sodium: 139 mmol/L (ref 135–145)

## 2015-09-21 LAB — CBC
HEMATOCRIT: 38.5 % (ref 36.0–46.0)
HEMOGLOBIN: 13 g/dL (ref 12.0–15.0)
MCH: 29.2 pg (ref 26.0–34.0)
MCHC: 33.8 g/dL (ref 30.0–36.0)
MCV: 86.5 fL (ref 78.0–100.0)
Platelets: 241 10*3/uL (ref 150–400)
RBC: 4.45 MIL/uL (ref 3.87–5.11)
RDW: 14.6 % (ref 11.5–15.5)
WBC: 8.8 10*3/uL (ref 4.0–10.5)

## 2015-09-21 LAB — URINE CULTURE

## 2015-09-21 MED ORDER — LEVOFLOXACIN 250 MG PO TABS: 250.0000 mg | ORAL_TABLET | Freq: Every day | ORAL | Status: DC

## 2015-09-21 MED ORDER — LEVOFLOXACIN 500 MG PO TABS
500.0000 mg | ORAL_TABLET | Freq: Once | ORAL | Status: AC
  Administered 2015-09-22: 500 mg via ORAL
  Filled 2015-09-21 (×2): qty 1

## 2015-09-21 NOTE — Progress Notes (Addendum)
Pharmacy Antibiotic Note  Katherine Miranda is a 82 y.o. female admitted on 09/18/2015 with UTI and HCAP.  Pharmacy has been consulted for cefepime dosing.  09/21/2015 Day #3 antibiotics for e. Coli UTI with bacteremia.  Also treating PNA.  Pharmacy asked to d/c cefepime and change antibiotics to levofloxacin to complete a 10 course  Plan:  D/C Cefepime after this PM dose  Levofloxacin 500mg  PO x 1 4/23 then 250mg  daily x 6 doses to complete 10-day course. Use lower dose for age and est CrCl.  Height: 5\' 5"  (165.1 cm) Weight: 153 lb 3.5 oz (69.5 kg) IBW/kg (Calculated) : 57  Temp (24hrs), Avg:97.9 F (36.6 C), Min:97.5 F (36.4 C), Max:98.3 F (36.8 C)   Recent Labs Lab 09/18/15 2020 09/18/15 2031 09/19/15 0040 09/20/15 0522 09/21/15 0519  WBC 11.0*  --   --   --  8.8  CREATININE 0.97  --   --  0.88 0.82  LATICACIDVEN  --  0.83 0.89  --   --     Estimated Creatinine Clearance: 42.8 mL/min (by C-G formula based on Cr of 0.82).    No Known Allergies  Antimicrobials this admission:  4/20 vancomycin >> 4/20  4/20 zmax >> 4/20  4/20 rocephin >> 4/20  4/20 cefepime >>  Dose adjustments this admission:  -  Microbiology results:  4/20 BCx: 2/2 e. Coli (R to amp, I to amp/sulb, S to others)  4/20 UCx: e. Coli (susc as per bld cx)  4/20 MRSA PCR: neg  4/20 strep pneumo ur ag: neg  Thank you for allowing pharmacy to be a part of this patient's care.  Doreene Eland, PharmD, BCPS.   Pager: [phone] 09/21/2015 2:18 PM

## 2015-09-21 NOTE — Care Management Note (Addendum)
Case Management Note  Patient Details  Name: Katherine Miranda MRN: [id number] Date of Birth: September 04, 1920  Subjective/Objective:         Bilateral PNA, UTI           Action/Plan: Discharge Planning:  NCM spoke to pt at bedside. Lives at home with dtr, [name] [phone]. Per pt, she has oxygen at home. Unit RN will follow up daughter. States she had [facility] in the past. Waiting for final recommendations for home. Will need HH orders with [facility]. Will contact [facility] Liaison # [phone] with new order when available.    09/22/2015 1348 Contacted [facility] to make aware of scheduled dc home today with HH.    Expected Discharge Date:  09/22/2015               Expected Discharge Plan:  [place]  In-House Referral:  Clinical Social Work  Discharge planning Services  CM Consult  Post Acute Care Choice:  NA Choice offered to:  NA  DME Arranged:  N/A DME Agency:  NA  HH Arranged:  PT, OT [place] Agency:  [place]  Status of Service:  complete Medicare Important Message Given:  yes Date Medicare IM Given:    Medicare IM give by:    Date Additional Medicare IM Given:    Additional Medicare Important Message give by:     If discussed at [place] of Stay Meetings, dates discussed:    Additional Comments: Jonnie Finner RN CCM Case Mgmt phone [phone] Erenest Rasher, RN 09/21/2015, 2:28 PM

## 2015-09-21 NOTE — Progress Notes (Signed)
PROGRESS NOTE  Katherine Miranda  [id number] DOB: 1921/01/01  DOA: 09/18/2015 PCP: [name], MD  Outpatient Specialists:  Not known  Brief Narrative:  82 year old female patient, PMH of HTN, dementia, A. fib, recurrent UTIs, ambulates with a walker, presented to ED on 09/18/15 with complaints of generalized weakness, confusion worse than her baseline, fever of 100.54F. As per report, history of recurrent UTI with similar symptoms in the past. En route to the ED, 92% on room air and desaturated to 88% and was placed on 2 L [place] (not on home oxygen). Unable to walk with walker, due to weakness. No fall or LOC reported. Admitted for bilateral lower lobe pneumonia and UTI.  Assessment & Plan:   Principal Problem:   Bilateral pneumonia Active Problems:   Dementia   HTN (hypertension)   Delirium   UTI (urinary tract infection)   Healthcare associated pneumonia/bilateral lower lobe - Initially empirically started on IV Rocephin, azithromycin and vancomycin. Since patient was hospitalized <90 days ago, broadened antibiotic spectrum: DC'ed Rocephin and azithromycin and started IV cefepime. MRSA PCR negative and hence discontinued vancomycin. - Recommend follow-up chest x-ray in 3-4 weeks to ensure resolution of pneumonia findings. - During prior hospitalization, patient's diet was upgraded by speech therapy to regular consistency diet. - Unusual for her to have 2 different sources of infection i.e. UTI and pneumonia. In the absence of clear symptoms, it is possible that she actually did not have a pneumonia. In any event, complete 7 days treatment for same.  Escherichia coli UTI (recurrent) complicated by bacteremia - Continue cefepime pending final sensitivities. - Blood cultures 2: Gram-negative rods. Urine culture 1: Escherichia coli - Final sensitivities reviewed. Discussed with infectious disease M.D. on call who recommended transitioning to oral levofloxacin to complete total 10  days treatment.  Acute encephalopathy - Secondary to acute infectious process complicating underlying dementia. No agitation. As per daughter, mental status improved and close to baseline.  Essential hypertension - Controlled. Amlodipine.  A. Fib - Rate controlled. Continue Eliquis.  Dementia, moderate - Continue Namenda  Hypothyroid - Continue levothyroxine.     DVT prophylaxis: on Eliquis AC Code Status: Full Family Communication: Discussed with patient's daughter Ms. [name] at length on 4/21. None at bedside today. Disposition Plan: PTA: single, lives at home and has 24/7 assistance and care and ambulates with walker. PT evaluation to decide discharge location. Possible DC in 24 hours.   Consultants:   None  Procedures:   None  Antimicrobials:   Azithromycin 1 dose  Ceftriaxone 1 dose  Vancomycin 1 dose  Cefepime 4/20 >   Subjective: Denies complaints. Pleasantly confused. As per RN, no acute issues.  Objective:  Filed Vitals:   09/20/15 0456 09/20/15 1423 09/20/15 2044 09/21/15 0654  BP: 133/53 142/58 153/59 149/59  Pulse: 73 62 63 73  Temp: 98 F (36.7 C) 98.3 F (36.8 C) 97.5 F (36.4 C) 97.7 F (36.5 C)  TempSrc: Oral Oral Oral Oral  Resp: 19 19 19 18   Height:      Weight:      SpO2: 94% 98% 100% 97%    Intake/Output Summary (Last 24 hours) at 09/21/15 1230 Last data filed at 09/21/15 0825  Gross per 24 hour  Intake    240 ml  Output      0 ml  Net    240 ml   Filed Weights   09/18/15 1945 09/19/15 0100  Weight: 70.761 kg (156 lb) 69.5 kg (153 lb 3.5 oz)  Examination:  General exam: Pleasant elderly female sitting up comfortably in bed. Respiratory system: Diminished breath sounds in the bases but otherwise clear to auscultation. Respiratory effort normal. Cardiovascular system: S1 & S2 heard, RRR.Marland Kitchen No JVD, murmurs, rubs, gallops or clicks. No pedal edema. Gastrointestinal system: Abdomen is nondistended, soft and  nontender. No organomegaly or masses felt. Normal bowel sounds heard. Central nervous system: Alert and oriented to self only. No focal neurological deficits. Extremities: Symmetric 5 x 5 power. Skin: No rashes, lesions or ulcers Psychiatry: Judgement and insight appear normal. Mood & affect appropriate.     Data Reviewed: I have personally reviewed following labs and imaging studies  CBC:  Recent Labs Lab 09/18/15 2020 09/21/15 0519  WBC 11.0* 8.8  NEUTROABS 9.4*  --   HGB 13.7 13.0  HCT 38.8 38.5  MCV 84.3 86.5  PLT 265 [id number]   Basic Metabolic Panel:  Recent Labs Lab 09/18/15 2020 09/20/15 0522 09/21/15 0519  NA 134* 132* 139  K 4.2 3.7 3.9  CL 96* 98* 101  CO2 26 24 29   GLUCOSE 141* 113* 122*  BUN 24* 17 16  CREATININE 0.97 0.88 0.82  CALCIUM 9.3 7.9* 8.6*   GFR: Estimated Creatinine Clearance: 42.8 mL/min (by C-G formula based on Cr of 0.82). Liver Function Tests:  Recent Labs Lab 09/18/15 2020  AST 20  ALT 21  ALKPHOS 94  BILITOT 1.0  PROT 7.7  ALBUMIN 4.0   No results for input(s): LIPASE, AMYLASE in the last 168 hours. No results for input(s): AMMONIA in the last 168 hours. Coagulation Profile: No results for input(s): INR, PROTIME in the last 168 hours. Cardiac Enzymes: No results for input(s): CKTOTAL, CKMB, CKMBINDEX, TROPONINI in the last 168 hours. BNP (last 3 results) No results for input(s): PROBNP in the last 8760 hours. HbA1C: No results for input(s): HGBA1C in the last 72 hours. CBG: No results for input(s): GLUCAP in the last 168 hours. Lipid Profile: No results for input(s): CHOL, HDL, LDLCALC, TRIG, CHOLHDL, LDLDIRECT in the last 72 hours. Thyroid Function Tests: No results for input(s): TSH, T4TOTAL, FREET4, T3FREE, THYROIDAB in the last 72 hours. Anemia Panel: No results for input(s): VITAMINB12, FOLATE, FERRITIN, TIBC, IRON, RETICCTPCT in the last 72 hours. Urine analysis:    Component Value Date/Time   COLORURINE YELLOW  09/18/2015 2358   APPEARANCEUR TURBID* 09/18/2015 2358   LABSPEC 1.010 09/18/2015 2358   PHURINE 7.5 09/18/2015 2358   GLUCOSEU NEGATIVE 09/18/2015 2358   HGBUR MODERATE* 09/18/2015 2358   BILIRUBINUR NEGATIVE 09/18/2015 2358   KETONESUR NEGATIVE 09/18/2015 2358   PROTEINUR 100* 09/18/2015 2358   UROBILINOGEN 0.2 04/15/2015 1509   NITRITE NEGATIVE 09/18/2015 2358   LEUKOCYTESUR LARGE* 09/18/2015 2358   Sepsis Labs: @LABRCNTIP (procalcitonin:4,lacticidven:4)  ) Recent Results (from the past 240 hour(s))  Blood Culture (routine x 2)     Status: Abnormal (Preliminary result)   Collection Time: 09/18/15  8:26 PM  Result Value Ref Range Status   Specimen Description BLOOD RIGHT HAND  Final   Special Requests BOTTLES DRAWN AEROBIC AND ANAEROBIC 10CC  Final   Culture  Setup Time   Final    GRAM NEGATIVE RODS IN BOTH AEROBIC AND ANAEROBIC BOTTLES CRITICAL RESULT CALLED TO, READ BACK BY AND VERIFIED WITH: Oval Linsey RN 16:40 09/19/15  ([email or web address]) Performed at [facility] (A)  Final   Report Status PENDING  Incomplete   Organism ID, Bacteria ESCHERICHIA COLI  Final  Susceptibility   Escherichia coli - MIC*    AMPICILLIN >=32 RESISTANT Resistant     CEFAZOLIN <=4 SENSITIVE Sensitive     CEFEPIME <=1 SENSITIVE Sensitive     CEFTAZIDIME <=1 SENSITIVE Sensitive     CEFTRIAXONE <=1 SENSITIVE Sensitive     CIPROFLOXACIN <=0.25 SENSITIVE Sensitive     GENTAMICIN <=1 SENSITIVE Sensitive     IMIPENEM <=0.25 SENSITIVE Sensitive     TRIMETH/SULFA <=20 SENSITIVE Sensitive     AMPICILLIN/SULBACTAM 16 INTERMEDIATE Intermediate     PIP/TAZO <=4 SENSITIVE Sensitive     * ESCHERICHIA COLI  Urine culture     Status: Abnormal   Collection Time: 09/18/15 10:25 PM  Result Value Ref Range Status   Specimen Description URINE, CATHETERIZED  Final   Special Requests NONE  Final   Culture >=100,000 COLONIES/mL ESCHERICHIA COLI (A)  Final   Report Status 09/21/2015  FINAL  Final   Organism ID, Bacteria ESCHERICHIA COLI (A)  Final      Susceptibility   Escherichia coli - MIC*    AMPICILLIN >=32 RESISTANT Resistant     CEFAZOLIN <=4 SENSITIVE Sensitive     CEFTRIAXONE <=1 SENSITIVE Sensitive     CIPROFLOXACIN <=0.25 SENSITIVE Sensitive     GENTAMICIN <=1 SENSITIVE Sensitive     IMIPENEM <=0.25 SENSITIVE Sensitive     NITROFURANTOIN <=16 SENSITIVE Sensitive     TRIMETH/SULFA <=20 SENSITIVE Sensitive     AMPICILLIN/SULBACTAM 16 INTERMEDIATE Intermediate     PIP/TAZO <=4 SENSITIVE Sensitive     * >=100,000 COLONIES/mL ESCHERICHIA COLI  Blood Culture (routine x 2)     Status: Abnormal   Collection Time: 09/19/15 12:22 AM  Result Value Ref Range Status   Specimen Description BLOOD LEFT ANTECUBITAL  Final   Special Requests BOTTLES DRAWN AEROBIC AND ANAEROBIC 5CC  Final   Culture  Setup Time   Final    GRAM NEGATIVE RODS IN BOTH AEROBIC AND ANAEROBIC BOTTLES CRITICAL RESULT CALLED TO, READ BACK BY AND VERIFIED WITH: R REID,RN @1953  09/19/15 W J[facility]NSON    Culture (A)  Final    ESCHERICHIA COLI SUSCEPTIBILITIES PERFORMED ON PREVIOUS CULTURE WITHIN THE LAST 5 DAYS. Performed at [facility]    Report Status 09/21/2015 FINAL  Final  MRSA PCR Screening     Status: None   Collection Time: 09/19/15  1:42 AM  Result Value Ref Range Status   MRSA by PCR NEGATIVE NEGATIVE Final    Comment:        The GeneXpert MRSA Assay (FDA approved for NASAL specimens only), is one component of a comprehensive MRSA colonization surveillance program. It is not intended to diagnose MRSA infection nor to guide or monitor treatment for MRSA infections.          Radiology Studies: No results found.      Scheduled Meds: . amLODipine  10 mg Oral QHS  . apixaban  2.5 mg Oral BID  . ceFEPime (MAXIPIME) IV  1 g Intravenous Q12H  . donepezil  10 mg Oral QHS  . iron polysaccharides  150 mg Oral Daily  . latanoprost  1 drop Both Eyes QHS  .  levothyroxine  25 mcg Oral QAC breakfast  . loratadine  10 mg Oral QHS  . memantine  10 mg Oral QHS   Continuous Infusions:     LOS: 2 days    Time spent: 15 minutes.    [name], MD [facility] Pager [phone]  If 7PM-7AM, please contact night-coverage [email or web address] Password  [email or web address] 09/21/2015, 12:30 PM

## 2015-09-21 NOTE — Care Management Important Message (Signed)
Important Message  Patient Details  Name: UMA JUNIPER MRN: [id number] Date of Birth: November 26, 1920   Medicare Important Message Given:  Yes    Erenest Rasher, RN 09/21/2015, 2:32 PM

## 2015-09-21 NOTE — Evaluation (Signed)
Physical Therapy Evaluation Patient Details Name: Katherine Miranda MRN: [id number] DOB: 1920/07/28 Today's Date: 09/21/2015   History of Present Illness  Pt 82 yo with h/o dementia , HTN, a fib, with recetn B pna, and now with UTI. Had been in [facility] for rehab, but now back at home with 24 hr care givers to assist.   Clinical Impression  Pt with decreased strength and decreased ability with functional mobility deferring from PLOF. Pt to benefit from continued PT to increase safety and ability to transition home with 24 hr caregivers. Recommend continued HHPT and [place], as well. During session educated dtr on use of gait belt for her and her caregivers, cueing necessary to help with initiating movement and decreasing shuffling with ambulation, and noticed pill rolling in quite sitting. Pt was on 2 L O2 when I entered at 100% and dtr stated she was not on home O2 regularly. During session removed and pt on RA and with ambulation down to 93%. Nursing was going to check patient later after quite resting to assess O2 saturation.     Follow Up Recommendations Home health PT (and recommend [place] [facility] as well. They are pleased and have used [facility] in the past. )    Equipment Recommendations  None recommended by PT    Recommendations for Other Services       Precautions / Restrictions        Mobility  Bed Mobility Overal bed mobility: Needs Assistance Bed Mobility: Supine to Sit;Sit to Supine     Supine to sit: Min guard     General bed mobility comments: just cues and instruction and pt follows them nicely   Transfers Overall transfer level: Needs assistance Equipment used: Rolling walker (2 wheeled) Transfers: Sit to/from Stand Sit to Stand: Min assist         General transfer comment: cues for hand plamcnet and safety and especially with turn to sit for cues of not to sit too early and hand placement   Ambulation/Gait Ambulation/Gait assistance: Min assist;+2  safety/equipment Ambulation Distance (Feet): 50 Feet (2 xs ) Assistive device: Rolling walker (2 wheeled) Gait Pattern/deviations: Decreased stride length;Shuffle     General Gait Details: difficulty of initiating step on R side at times,a dn R knee with audible creaking noises. Cue to stand taller at times for right knee, then left knee began to flex , but pt responds to cues . If shuffled gait on right especially, pt responded to "take big steps" or stop " and restart" if need to get stepping movment patern going again.   Stairs            Wheelchair Mobility    Modified Rankin (Stroke Patients Only)       Balance Overall balance assessment: Needs assistance             Standing balance comment: needs someone with her at all times, educated dtr with this as well. with gait belt and cueing.                              Pertinent Vitals/Pain Pain Assessment: No/denies pain (dtr reports that patient always says "no " to pain. Very high tolerance/)    Home Living Family/patient expects to be discharged to:: Private residence Living Arrangements: Children Available Help at Discharge: Family;Personal care attendant;Available 24 hours/day Type of Home: House Home Access: Ramped entrance     Home Layout: One level Home Equipment:  Walker - 2 wheels;Walker - 4 wheels;Transport chair;Bedside commode;Shower seat - built in      Prior Function Level of Independence: Needs assistance         Comments: caregivers are alwasy with her assisting her with all ADLS and mobility, however pt particiapates and follows commnads very nicely.      Hand Dominance        Extremity/Trunk Assessment               Lower Extremity Assessment: Generalized weakness (difficulty with initiation of momvnet patterns, also noted pill rolling in hands at rest)         Communication   Communication: Other (comment) (pt very pleasant however due to dementia will likley  alwasy say YES  with not a lot variation )  Cognition Arousal/Alertness: Awake/alert Behavior During Therapy: WFL for tasks assessed/performed Overall Cognitive Status: Within Functional Limits for tasks assessed (however pt has h/o dementia and is at baseline per dtr)                      General Comments      Exercises        Assessment/Plan    PT Assessment Patient needs continued PT services  PT Diagnosis Difficulty walking;Generalized weakness   PT Problem List Decreased strength;Decreased activity tolerance;Decreased balance;Decreased mobility  PT Treatment Interventions Gait training;Functional mobility training;Therapeutic activities;Therapeutic exercise;Patient/family education   PT Goals (Current goals can be found in the Care Plan section) Acute Rehab PT Goals Patient Stated Goal: I would like to see her go home if you feel that would be safe , per dtr PT Goal Formulation: With patient/family Time For Goal Achievement: 10/05/15 Potential to Achieve Goals: Good    Frequency Min 3X/week   Barriers to discharge        Co-evaluation               End of Session Equipment Utilized During Treatment: Gait belt Activity Tolerance: Patient tolerated treatment well Patient left: in bed;with call bell/phone within reach;with bed alarm set;with family/visitor present Nurse Communication: Mobility status         Time: [id number] PT Time Calculation (min) (ACUTE ONLY): 48 min   Charges:   PT Evaluation $PT Eval Low Complexity: 1 Procedure PT Treatments $Gait Training: 8-22 mins $Therapeutic Activity: 8-22 mins   PT G CodesClide Dales 10-20-2015, 1:51 PM  Clide Dales, PT Pager: [phone] 2015-10-20

## 2015-09-22 DIAGNOSIS — R410 Disorientation, unspecified: Secondary | ICD-10-CM

## 2015-09-22 LAB — CULTURE, BLOOD (ROUTINE X 2)

## 2015-09-22 MED ORDER — LEVOFLOXACIN 250 MG PO TABS: 250.0000 mg | ORAL_TABLET | Freq: Every day | ORAL | Status: DC

## 2015-09-22 NOTE — Discharge Summary (Addendum)
Physician Discharge Summary  NEA SCHNIEDER  [id number]  DOB: 17-Sep-1920  DOA: 09/18/2015  PCP: [name], MD  Admit date: 09/18/2015 Discharge date: 09/22/2015  Time spent: Greater than 30 minutes  Recommendations for Outpatient Follow-up:  1. Dr. [name], PCP in 1 week. 2. May consider outpatient urology consultation for evaluation and management of recurrent UTIs. 3. Home health PT & OT. 4. Recommend follow-up chest x-ray in 3-4 weeks to ensure resolution of pneumonia findings.  Discharge Diagnoses:  Principal Problem:   Bilateral pneumonia Active Problems:   Dementia   HTN (hypertension)   Delirium   UTI (urinary tract infection)   Discharge Condition: Improved & Stable  Diet recommendation: Heart healthy diet.  Filed Weights   09/18/15 1945 09/19/15 0100  Weight: 70.761 kg (156 lb) 69.5 kg (153 lb 3.5 oz)    History of present illness:  82 year old female patient, PMH of HTN, dementia, A. fib, recurrent UTIs, ambulates with a walker, presented to ED on 09/18/15 with complaints of generalized weakness, confusion worse than her baseline, fever of 100.44F. As per report, history of recurrent UTI with similar symptoms in the past. En route to the ED, 92% on room air and desaturated to 88% and was placed on 2 L [place] (not on home oxygen). Unable to walk with walker, due to weakness. No fall or LOC reported. Admitted for bilateral lower lobe pneumonia and UTI.  Hospital Course:   Healthcare associated pneumonia/bilateral lower lobe - Initially empirically started on IV Rocephin, azithromycin and vancomycin. Since patient was hospitalized <90 days ago, broadened antibiotic spectrum: DC'ed Rocephin and azithromycin and started IV cefepime. MRSA PCR negative and hence discontinued vancomycin. - Recommend follow-up chest x-ray in 3-4 weeks to ensure resolution of pneumonia findings. - During prior hospitalization, patient's diet was upgraded by speech therapy to  regular consistency diet. - Unusual for her to have 2 different sources of infection i.e. UTI and pneumonia. In the absence of clear symptoms, it is possible that she actually did not have a pneumonia. In any event, complete 7 days treatment for same. - PTA: single, lives at home and has 24/7 assistance and care and ambulates with walker.physical therapy evaluated and recommended home health PT and OT.  - No hypoxia on evaluating for home oxygen requirements.   Escherichia coli UTI (recurrent) complicated by bacteremia - Treated empirically with cefepime initially. - Blood cultures 2: & Urine culture 1: Escherichia coli - Final sensitivities reviewed. Discussed with infectious disease M.D. on call on 4/22, who recommended transitioning to oral levofloxacin to complete total 10 days treatment. No surveillance blood cultures recommended.  Acute encephalopathy - Secondary to acute infectious process complicating underlying dementia. No agitation. As per daughter, mental status improved and close to baseline.  Essential hypertension - Mildly intermittently uncontrolled. Continue Amlodipine and outpatient follow-up for further adjustments.  A. Fib - Rate controlled. Continue Eliquis.  Dementia, moderate - Continue Namenda & Aricept  Hypothyroid - Continue levothyroxine.    Consultants:   None  Procedures:   None   Discharge Exam:  Complaints: Denies complaints. Pleasantly confused. No chest pain, cough or pain. As per RN, no acute issues.   Filed Vitals:   09/21/15 1243 09/21/15 1338 09/21/15 2202 09/22/15 0600  BP: 155/59  162/63 158/73  Pulse: 71 89 58 79  Temp: 98.2 F (36.8 C)  98.2 F (36.8 C) 98.5 F (36.9 C)  TempSrc: Oral  Oral Oral  Resp: 18  18 18   Height:  Weight:      SpO2: 100% 93% 96% 92%    General exam: Pleasant elderly female sitting up comfortably in bed. Just finished eating her breakfast this morning. Respiratory system: Clear. No  increased work of breathing. Cardiovascular system: S1 & S2 heard, RRR. No JVD, murmurs, gallops, clicks or pedal edema. Gastrointestinal system: Abdomen is nondistended, soft and nontender. Normal bowel sounds heard. Central nervous system: Alert and oriented only to self . No focal neurological deficits. Extremities: Symmetric 5 x 5 power.  Discharge Instructions      Discharge Instructions    Call MD for:  difficulty breathing, headache or visual disturbances    Complete by:  As directed      Call MD for:  extreme fatigue    Complete by:  As directed      Call MD for:  persistant dizziness or light-headedness    Complete by:  As directed      Call MD for:  persistant nausea and vomiting    Complete by:  As directed      Call MD for:  severe uncontrolled pain    Complete by:  As directed      Call MD for:  temperature >100.4    Complete by:  As directed      Diet - low sodium heart healthy    Complete by:  As directed      Increase activity slowly    Complete by:  As directed             Medication List    TAKE these medications        acetaminophen 500 MG tablet  Commonly known as:  TYLENOL  Take 500-1,000 mg by mouth every 6 (six) hours as needed for moderate pain.     alendronate 70 MG tablet  Commonly known as:  FOSAMAX  Take 70 mg by mouth every Tuesday. Take with a full glass of water on an empty stomach.     amLODipine 10 MG tablet  Commonly known as:  NORVASC  Take 10 mg by mouth at bedtime.     CALCIUM PO  Take 1 tablet by mouth 2 (two) times daily.     CoQ10 200 MG Caps  Take 200 mg by mouth daily.     CRANBERRY PO  Take 300 mg by mouth daily.     donepezil 10 MG tablet  Commonly known as:  ARICEPT  Take 10 mg by mouth at bedtime.     ELIQUIS 2.5 MG Tabs tablet  Generic drug:  apixaban  TAKE 1 TABLET TWICE A DAY     Fish Oil 1000 MG Caps  Take 1,000 mg by mouth daily.     iron polysaccharides 150 MG capsule  Commonly known as:  NIFEREX   Take 150 mg by mouth daily.     levofloxacin 250 MG tablet  Commonly known as:  LEVAQUIN  Take 1 tablet (250 mg total) by mouth daily.  Start taking on:  09/23/2015     levothyroxine 25 MCG tablet  Commonly known as:  SYNTHROID, LEVOTHROID  Take 1 tablet (25 mcg total) by mouth daily before breakfast.     loratadine 10 MG tablet  Commonly known as:  CLARITIN  Take 10 mg by mouth at bedtime.     memantine 10 MG tablet  Commonly known as:  NAMENDA  Take 10 mg by mouth at bedtime.     PROBIOTIC PO  Take 1 tablet by mouth daily.  Travoprost (BAK Free) 0.004 % Soln ophthalmic solution  Commonly known as:  TRAVATAN  Place 1 drop into both eyes at bedtime.     UVA URSI PO  Take 1 tablet by mouth daily.     vitamin C 1000 MG tablet  Take 1,000 mg by mouth 2 (two) times daily.     Vitamin D 2000 units tablet  Take 2,000 Units by mouth 2 (two) times daily.       Follow-up Information    Follow up with SHAW,[name], MD. Schedule an appointment as soon as possible for a visit in 1 week.   Specialty:  Family Medicine   Contact information:   [address] [place] [address] [phone]       Get Medicines reviewed and adjusted: Please take all your medications with you for your next visit with your Primary MD  Please request your Primary MD to go over all hospital tests and procedure/radiological results at the follow up. Please ask your Primary MD to get all Hospital records sent to his/her office.  If you experience worsening of your admission symptoms, develop shortness of breath, life threatening emergency, suicidal or homicidal thoughts you must seek medical attention immediately by calling 911 or calling your MD immediately if symptoms less severe.  You must read complete instructions/literature along with all the possible adverse reactions/side effects for all the Medicines you take and that have been prescribed to you. Take any new Medicines  after you have completely understood and accept all the possible adverse reactions/side effects.   Do not drive when taking pain medications.   Do not take more than prescribed Pain, Sleep and Anxiety Medications  Special Instructions: If you have smoked or chewed Tobacco in the last 2 yrs please stop smoking, stop any regular Alcohol and or any Recreational drug use.  Wear Seat belts while driving.  Please note  You were cared for by a hospitalist during your hospital stay. Once you are discharged, your primary care physician will handle any further medical issues. Please note that NO REFILLS for any discharge medications will be authorized once you are discharged, as it is imperative that you return to your primary care physician (or establish a relationship with a primary care physician if you do not have one) for your aftercare needs so that they can reassess your need for medications and monitor your lab values.    The results of significant diagnostics from this hospitalization (including imaging, microbiology, ancillary and laboratory) are listed below for reference.    Significant Diagnostic Studies: Dg Chest 2 View  09/18/2015  CLINICAL DATA:  Altered mental status, weakness, fever and loss of appetite. EXAM: CHEST  2 VIEW COMPARISON:  04/21/2015.  04/20/2015.  04/15/2015.  10/26/2012. FINDINGS: There is left ventricular prominence. The aorta is unfolded. There is some volume loss and abnormal patchy density in both lower lobes when compared to previous studies, suggesting mild basilar pneumonia bilaterally. No upper lobe lung disease. No heart failure or effusion. No significant bone finding. IMPRESSION: Diminished lung volumes and patchy density in both lower lobes consistent with mild bilateral lower lobe pneumonia. Electronically Signed   By: Nelson Chimes M.D.   On: 09/18/2015 21:19   Dg Knee Complete 4 Views Right  09/18/2015  CLINICAL DATA:  Right leg weakness.  Right knee  pain. EXAM: RIGHT KNEE - COMPLETE 4+ VIEW COMPARISON:  None. FINDINGS: Total right knee arthroplasty in expected alignment. No periprosthetic lucency. No fracture. There  has been patellar resurfacing. No joint effusion. No focal soft tissue abnormality. IMPRESSION: Intact right knee total arthroplasty without complication. No acute bony abnormality. No joint effusion. Electronically Signed   By: Jeb Levering M.D.   On: 09/18/2015 21:17   Dg Hip Unilat With Pelvis 2-3 Views Right  09/18/2015  CLINICAL DATA:  Right leg weakness, progressive. EXAM: DG HIP (WITH OR WITHOUT PELVIS) 2-3V RIGHT COMPARISON:  None. FINDINGS: The bones are under mineralized. The cortical margins of the bony pelvis are intact. No fracture. Pubic symphysis and sacroiliac joints are congruent. Both femoral heads are well-seated in the respective acetabula. Osteoarthritis of both hips, expected for age. Surgical hardware in the lower lumbar spine, partially included. IMPRESSION: Osteoarthritis of both hips.  No acute bony abnormality. Electronically Signed   By: Jeb Levering M.D.   On: 09/18/2015 21:16    Microbiology: Recent Results (from the past 240 hour(s))  Blood Culture (routine x 2)     Status: Abnormal   Collection Time: 09/18/15  8:26 PM  Result Value Ref Range Status   Specimen Description BLOOD RIGHT HAND  Final   Special Requests BOTTLES DRAWN AEROBIC AND ANAEROBIC 10CC  Final   Culture  Setup Time   Final    GRAM NEGATIVE RODS IN BOTH AEROBIC AND ANAEROBIC BOTTLES CRITICAL RESULT CALLED TO, READ BACK BY AND VERIFIED WITH: Oval Linsey RN 16:40 09/19/15  ([email or web address]) Performed at [place] (A)  Final   Report Status 09/22/2015 FINAL  Final   Organism ID, Bacteria ESCHERICHIA COLI  Final      Susceptibility   Escherichia coli - MIC*    AMPICILLIN >=32 RESISTANT Resistant     CEFAZOLIN <=4 SENSITIVE Sensitive     CEFEPIME <=1 SENSITIVE Sensitive     CEFTAZIDIME <=1  SENSITIVE Sensitive     CEFTRIAXONE <=1 SENSITIVE Sensitive     CIPROFLOXACIN <=0.25 SENSITIVE Sensitive     GENTAMICIN <=1 SENSITIVE Sensitive     IMIPENEM <=0.25 SENSITIVE Sensitive     TRIMETH/SULFA <=20 SENSITIVE Sensitive     AMPICILLIN/SULBACTAM 16 INTERMEDIATE Intermediate     PIP/TAZO <=4 SENSITIVE Sensitive     * ESCHERICHIA COLI  Urine culture     Status: Abnormal   Collection Time: 09/18/15 10:25 PM  Result Value Ref Range Status   Specimen Description URINE, CATHETERIZED  Final   Special Requests NONE  Final   Culture >=100,000 COLONIES/mL ESCHERICHIA COLI (A)  Final   Report Status 09/21/2015 FINAL  Final   Organism ID, Bacteria ESCHERICHIA COLI (A)  Final      Susceptibility   Escherichia coli - MIC*    AMPICILLIN >=32 RESISTANT Resistant     CEFAZOLIN <=4 SENSITIVE Sensitive     CEFTRIAXONE <=1 SENSITIVE Sensitive     CIPROFLOXACIN <=0.25 SENSITIVE Sensitive     GENTAMICIN <=1 SENSITIVE Sensitive     IMIPENEM <=0.25 SENSITIVE Sensitive     NITROFURANTOIN <=16 SENSITIVE Sensitive     TRIMETH/SULFA <=20 SENSITIVE Sensitive     AMPICILLIN/SULBACTAM 16 INTERMEDIATE Intermediate     PIP/TAZO <=4 SENSITIVE Sensitive     * >=100,000 COLONIES/mL ESCHERICHIA COLI  Blood Culture (routine x 2)     Status: Abnormal   Collection Time: 09/19/15 12:22 AM  Result Value Ref Range Status   Specimen Description BLOOD LEFT ANTECUBITAL  Final   Special Requests BOTTLES DRAWN AEROBIC AND ANAEROBIC 5CC  Final   Culture  Setup Time   Final  GRAM NEGATIVE RODS IN BOTH AEROBIC AND ANAEROBIC BOTTLES CRITICAL RESULT CALLED TO, READ BACK BY AND VERIFIED WITH: R REID,RN @1953  09/19/15 W [facility]    Culture (A)  Final    ESCHERICHIA COLI SUSCEPTIBILITIES PERFORMED ON PREVIOUS CULTURE WITHIN THE LAST 5 DAYS. Performed at [facility]    Report Status 09/21/2015 FINAL  Final  MRSA PCR Screening     Status: None   Collection Time: 09/19/15  1:42 AM  Result Value Ref Range Status    MRSA by PCR NEGATIVE NEGATIVE Final    Comment:        The GeneXpert MRSA Assay (FDA approved for NASAL specimens only), is one component of a comprehensive MRSA colonization surveillance program. It is not intended to diagnose MRSA infection nor to guide or monitor treatment for MRSA infections.      Labs: Basic Metabolic Panel:  Recent Labs Lab 09/18/15 2020 09/20/15 0522 09/21/15 0519  NA 134* 132* 139  K 4.2 3.7 3.9  CL 96* 98* 101  CO2 26 24 29   GLUCOSE 141* 113* 122*  BUN 24* 17 16  CREATININE 0.97 0.88 0.82  CALCIUM 9.3 7.9* 8.6*   Liver Function Tests:  Recent Labs Lab 09/18/15 2020  AST 20  ALT 21  ALKPHOS 94  BILITOT 1.0  PROT 7.7  ALBUMIN 4.0   No results for input(s): LIPASE, AMYLASE in the last 168 hours. No results for input(s): AMMONIA in the last 168 hours. CBC:  Recent Labs Lab 09/18/15 2020 09/21/15 0519  WBC 11.0* 8.8  NEUTROABS 9.4*  --   HGB 13.7 13.0  HCT 38.8 38.5  MCV 84.3 86.5  PLT 265 241   Cardiac Enzymes: No results for input(s): CKTOTAL, CKMB, CKMBINDEX, TROPONINI in the last 168 hours. BNP: BNP (last 3 results) No results for input(s): BNP in the last 8760 hours.  ProBNP (last 3 results) No results for input(s): PROBNP in the last 8760 hours.  CBG: No results for input(s): GLUCAP in the last 168 hours.     Signed:  Vernell Leep, MD, FACP, FHM. [facility] Pager [phone]  If 7PM-7AM, please contact night-coverage [email or web address] Password [email or web address] 09/22/2015, 1:17 PM

## 2015-09-22 NOTE — Progress Notes (Signed)
SATURATION QUALIFICATIONS: (This note is used to comply with regulatory documentation for home oxygen)  Patient Saturations on Room Air at Rest = 90%  Patient Saturations on Room Air while Ambulating = 92%  Patient Saturations on 0 Liters of oxygen while Ambulating = 92%

## 2015-09-22 NOTE — Progress Notes (Signed)
Went over d/c instructions with patient and daughter.  Daughter verbalized understanding.  Left hospital with all belongings and hard script.

## 2015-09-22 NOTE — Discharge Instructions (Signed)
Bacteremia °Bacteremia is the presence of bacteria in the blood. A small amount of bacteria may not cause any symptoms. °Sometimes, the bacteria spread and cause infection in other parts of the body, such as the heart, joints, bones, or brain. Having a great amount of bacteria can cause a serious, sometimes life-threatening infection called sepsis. °CAUSES °This condition is caused by bacteria that get into the blood. Bacteria can enter the blood: °· During a dental or medical procedure. °· After you brush your teeth so hard that the gums bleed. °· Through a scrape or cut on your skin. °More severe types of bacteremia can be caused by: °· A bacterial infection, such as pneumonia, that spreads to the blood. °· Using a dirty needle. °RISK FACTORS °This condition is more likely to develop in: °· Children and elderly adults. °· People who have a long-lasting (chronic) disease or medical condition. °· People who have an artificial joint or heart valve. °· People who have heart valve disease. °· People who have a tube, such as a catheter or IV tube, that has been inserted for a medical treatment. °· People who have a weak body defense system (immune system). °· People who use IV drugs. °SYMPTOMS °Usually, this condition does not cause symptoms when it is mild. When it is more serious, it may cause: °· Fever. °· Chills. °· Racing heart. °· Shortness of breath. °· Dizziness. °· Weakness. °· Confusion. °· Nausea or vomiting. °· Diarrhea. °Bacteremia that has spread to other parts of the body may cause symptoms in those areas. °DIAGNOSIS °This condition may be diagnosed with a physical exam and tests, such as: °· A complete blood count (CBC). This test looks for signs of infection. °· Blood cultures. These look for bacteria in your blood. °· Tests of any IV tubes. These look for a source of infection. °· Urine tests. °· Imaging tests, such as an X-ray, CT scan, MRI, or heart ultrasound. °TREATMENT °If the condition is mild,  treatment is usually not needed. Usually, the body's immune system will remove the bacteria. If the condition is more serious, it may be treated with: °· Antibiotic medicines through an IV tube. These may be given for about 2 weeks. At first, the antibiotic that is given may kill most types of blood bacteria. If your test results show that a certain kind of bacteria is causing problems, the antibiotic may be changed to kill only the bacteria that are causing problems. °· Antibiotics taken by mouth. °· Removing any catheter or IV tube that is a source of infection. °· Blood pressure and breathing support, if needed. °· Surgery to control the source or spread of infection, if needed. °HOME CARE INSTRUCTIONS °· Take over-the-counter and prescription medicines only as told by your health care provider. °· If you were prescribed an antibiotic, take it as told by your health care provider. Do not stop taking the antibiotic even if you start to feel better. °· Rest at home until your condition is under control. °· Drink enough fluid to keep your urine clear or pale yellow. °· Keep all follow-up visits as told by your health care provider. This is important. °PREVENTION °Take these actions to help prevent future episodes of bacteremia: °· Get all vaccinations as recommended by your health care provider. °· Clean and cover scrapes or cuts. °· Bathe regularly. °· Wash your hands often. °· Before any dental or surgical procedure, ask your health care provider if you should take an antibiotic. °SEEK MEDICAL   CARE IF:  Your symptoms get worse.  You continue to have symptoms after treatment.  You develop new symptoms after treatment. SEEK IMMEDIATE MEDICAL CARE IF:  You have chest pain or trouble breathing.  You develop confusion, dizziness, or weakness.  You develop pale skin.   This information is not intended to replace advice given to you by your health care provider. Make sure you discuss any questions you have  with your health care provider.   Document Released: 03/01/2006 Document Revised: 02/06/2015 Document Reviewed: 07/21/2014 Elsevier Interactive Patient Education 2016 [employer]   Urinary Tract Infection Urinary tract infections (UTIs) can develop anywhere along your urinary tract. Your urinary tract is your body's drainage system for removing wastes and extra water. Your urinary tract includes two kidneys, two ureters, a bladder, and a urethra. Your kidneys are a pair of bean-shaped organs. Each kidney is about the size of your fist. They are located below your ribs, one on each side of your spine. CAUSES Infections are caused by microbes, which are microscopic organisms, including fungi, viruses, and bacteria. These organisms are so small that they can only be seen through a microscope. Bacteria are the microbes that most commonly cause UTIs. SYMPTOMS  Symptoms of UTIs may vary by age and gender of the patient and by the location of the infection. Symptoms in young women typically include a frequent and intense urge to urinate and a painful, burning feeling in the bladder or urethra during urination. Older women and men are more likely to be tired, shaky, and weak and have muscle aches and abdominal pain. A fever may mean the infection is in your kidneys. Other symptoms of a kidney infection include pain in your back or sides below the ribs, nausea, and vomiting. DIAGNOSIS To diagnose a UTI, your caregiver will ask you about your symptoms. Your caregiver will also ask you to provide a urine sample. The urine sample will be tested for bacteria and white blood cells. White blood cells are made by your body to help fight infection. TREATMENT  Typically, UTIs can be treated with medication. Because most UTIs are caused by a bacterial infection, they usually can be treated with the use of antibiotics. The choice of antibiotic and length of treatment depend on your symptoms and the type of bacteria  causing your infection. HOME CARE INSTRUCTIONS  If you were prescribed antibiotics, take them exactly as your caregiver instructs you. Finish the medication even if you feel better after you have only taken some of the medication.  Drink enough water and fluids to keep your urine clear or pale yellow.  Avoid caffeine, tea, and carbonated beverages. They tend to irritate your bladder.  Empty your bladder often. Avoid holding urine for long periods of time.  Empty your bladder before and after sexual intercourse.  After a bowel movement, women should cleanse from front to back. Use each tissue only once. SEEK MEDICAL CARE IF:   You have back pain.  You develop a fever.  Your symptoms do not begin to resolve within 3 days. SEEK IMMEDIATE MEDICAL CARE IF:   You have severe back pain or lower abdominal pain.  You develop chills.  You have nausea or vomiting.  You have continued burning or discomfort with urination. MAKE SURE YOU:   Understand these instructions.  Will watch your condition.  Will get help right away if you are not doing well or get worse.   This information is not intended to replace advice given  to you by your health care provider. Make sure you discuss any questions you have with your health care provider.   Document Released: 02/25/2005 Document Revised: 02/06/2015 Document Reviewed: 06/26/2011 Elsevier Interactive Patient Education 2016 [place] Pneumonia, Adult Pneumonia is an infection of the lungs. There are different types of pneumonia. One type can develop while a person is in a hospital. A different type, called community-acquired pneumonia, develops in people who are not, or have not recently been, in the hospital or other health care facility.  CAUSES Pneumonia may be caused by bacteria, viruses, or funguses. Community-acquired pneumonia is often caused by Streptococcus pneumonia bacteria. These bacteria are often passed  from one person to another by breathing in droplets from the cough or sneeze of an infected person. RISK FACTORS The condition is more likely to develop in:  People who havechronic diseases, such as chronic obstructive pulmonary disease (COPD), asthma, congestive heart failure, cystic fibrosis, diabetes, or kidney disease.  People who haveearly-stage or late-stage HIV.  People who havesickle cell disease.  People who havehad their spleen removed (splenectomy).  People who havepoor Human resources officer.  People who havemedical conditions that increase the risk of breathing in (aspirating) secretions their own mouth and nose.   People who havea weakened immune system (immunocompromised).  People who smoke.  People whotravel to areas where pneumonia-causing germs commonly exist.  People whoare around animal habitats or animals that have pneumonia-causing germs, including birds, bats, rabbits, cats, and farm animals. SYMPTOMS Symptoms of this condition include:  Adry cough.  A wet (productive) cough.  Fever.  Sweating.  Chest pain, especially when breathing deeply or coughing.  Rapid breathing or difficulty breathing.  Shortness of breath.  Shaking chills.  Fatigue.  Muscle aches. DIAGNOSIS Your health care provider will take a medical history and perform a physical exam. You may also have other tests, including:  Imaging studies of your chest, including X-rays.  Tests to check your blood oxygen level and other blood gases.  Other tests on blood, mucus (sputum), fluid around your lungs (pleural fluid), and urine. If your pneumonia is severe, other tests may be done to identify the specific cause of your illness. TREATMENT The type of treatment that you receive depends on many factors, such as the cause of your pneumonia, the medicines you take, and other medical conditions that you have. For most adults, treatment and recovery from pneumonia may occur at home.  In some cases, treatment must happen in a hospital. Treatment may include:  Antibiotic medicines, if the pneumonia was caused by bacteria.  Antiviral medicines, if the pneumonia was caused by a virus.  Medicines that are given by mouth or through an IV tube.  Oxygen.  Respiratory therapy. Although rare, treating severe pneumonia may include:  Mechanical ventilation. This is done if you are not breathing well on your own and you cannot maintain a safe blood oxygen level.  Thoracentesis. This procedureremoves fluid around one lung or both lungs to help you breathe better. HOME CARE INSTRUCTIONS  Take over-the-counter and prescription medicines only as told by your health care provider.  Only takecough medicine if you are losing sleep. Understand that cough medicine can prevent your body's natural ability to remove mucus from your lungs.  If you were prescribed an antibiotic medicine, take it as told by your health care provider. Do not stop taking the antibiotic even if you start to feel better.  Sleep in a semi-upright position at night. Try sleeping in  a reclining chair, or place a few pillows under your head.  Do not use tobacco products, including cigarettes, chewing tobacco, and e-cigarettes. If you need help quitting, ask your health care provider.  Drink enough water to keep your urine clear or pale yellow. This will help to thin out mucus secretions in your lungs. PREVENTION There are ways that you can decrease your risk of developing community-acquired pneumonia. Consider getting a pneumococcal vaccine if:  You are older than 80 years of age.  You are older than 80 years of age and are undergoing cancer treatment, have chronic lung disease, or have other medical conditions that affect your immune system. Ask your health care provider if this applies to you. There are different types and schedules of pneumococcal vaccines. Ask your health care provider which vaccination  option is best for you. You may also prevent community-acquired pneumonia if you take these actions:  Get an influenza vaccine every year. Ask your health care provider which type of influenza vaccine is best for you.  Go to the dentist on a regular basis.  Wash your hands often. Use hand sanitizer if soap and water are not available. SEEK MEDICAL CARE IF:  You have a fever.  You are losing sleep because you cannot control your cough with cough medicine. SEEK IMMEDIATE MEDICAL CARE IF:  You have worsening shortness of breath.  You have increased chest pain.  Your sickness becomes worse, especially if you are an older adult or have a weakened immune system.  You cough up blood.   This information is not intended to replace advice given to you by your health care provider. Make sure you discuss any questions you have with your health care provider.   Document Released: 05/18/2005 Document Revised: 02/06/2015 Document Reviewed: 09/12/2014 Elsevier Interactive Patient Education [employer].

## 2015-09-22 NOTE — Progress Notes (Signed)
Patient Saturations on Room Air at Rest 90 %

## 2015-09-24 DIAGNOSIS — D181 Lymphangioma, any site: Secondary | ICD-10-CM | POA: Diagnosis not present

## 2015-09-24 DIAGNOSIS — I10 Essential (primary) hypertension: Secondary | ICD-10-CM | POA: Diagnosis not present

## 2015-09-24 DIAGNOSIS — R1310 Dysphagia, unspecified: Secondary | ICD-10-CM | POA: Diagnosis not present

## 2015-09-24 DIAGNOSIS — B9620 Unspecified Escherichia coli [E. coli] as the cause of diseases classified elsewhere: Secondary | ICD-10-CM | POA: Diagnosis not present

## 2015-09-24 DIAGNOSIS — N390 Urinary tract infection, site not specified: Secondary | ICD-10-CM | POA: Diagnosis not present

## 2015-09-24 DIAGNOSIS — Z9181 History of falling: Secondary | ICD-10-CM | POA: Diagnosis not present

## 2015-09-24 DIAGNOSIS — Z87440 Personal history of urinary (tract) infections: Secondary | ICD-10-CM | POA: Diagnosis not present

## 2015-09-24 DIAGNOSIS — I4891 Unspecified atrial fibrillation: Secondary | ICD-10-CM | POA: Diagnosis not present

## 2015-09-24 DIAGNOSIS — J189 Pneumonia, unspecified organism: Secondary | ICD-10-CM | POA: Diagnosis not present

## 2015-09-24 DIAGNOSIS — Z7901 Long term (current) use of anticoagulants: Secondary | ICD-10-CM | POA: Diagnosis not present

## 2015-09-24 DIAGNOSIS — F0390 Unspecified dementia without behavioral disturbance: Secondary | ICD-10-CM | POA: Diagnosis not present

## 2015-09-25 DIAGNOSIS — J189 Pneumonia, unspecified organism: Secondary | ICD-10-CM | POA: Diagnosis not present

## 2015-09-25 DIAGNOSIS — D181 Lymphangioma, any site: Secondary | ICD-10-CM | POA: Diagnosis not present

## 2015-09-25 DIAGNOSIS — N390 Urinary tract infection, site not specified: Secondary | ICD-10-CM | POA: Diagnosis not present

## 2015-09-25 DIAGNOSIS — B9620 Unspecified Escherichia coli [E. coli] as the cause of diseases classified elsewhere: Secondary | ICD-10-CM | POA: Diagnosis not present

## 2015-09-25 DIAGNOSIS — F0390 Unspecified dementia without behavioral disturbance: Secondary | ICD-10-CM | POA: Diagnosis not present

## 2015-09-25 DIAGNOSIS — I10 Essential (primary) hypertension: Secondary | ICD-10-CM | POA: Diagnosis not present

## 2015-09-26 DIAGNOSIS — F0390 Unspecified dementia without behavioral disturbance: Secondary | ICD-10-CM | POA: Diagnosis not present

## 2015-09-26 DIAGNOSIS — N390 Urinary tract infection, site not specified: Secondary | ICD-10-CM | POA: Diagnosis not present

## 2015-09-26 DIAGNOSIS — J180 Bronchopneumonia, unspecified organism: Secondary | ICD-10-CM | POA: Diagnosis not present

## 2015-10-01 DIAGNOSIS — I10 Essential (primary) hypertension: Secondary | ICD-10-CM | POA: Diagnosis not present

## 2015-10-01 DIAGNOSIS — J189 Pneumonia, unspecified organism: Secondary | ICD-10-CM | POA: Diagnosis not present

## 2015-10-01 DIAGNOSIS — F0390 Unspecified dementia without behavioral disturbance: Secondary | ICD-10-CM | POA: Diagnosis not present

## 2015-10-01 DIAGNOSIS — D181 Lymphangioma, any site: Secondary | ICD-10-CM | POA: Diagnosis not present

## 2015-10-01 DIAGNOSIS — N390 Urinary tract infection, site not specified: Secondary | ICD-10-CM | POA: Diagnosis not present

## 2015-10-01 DIAGNOSIS — B9620 Unspecified Escherichia coli [E. coli] as the cause of diseases classified elsewhere: Secondary | ICD-10-CM | POA: Diagnosis not present

## 2015-10-02 DIAGNOSIS — B9620 Unspecified Escherichia coli [E. coli] as the cause of diseases classified elsewhere: Secondary | ICD-10-CM | POA: Diagnosis not present

## 2015-10-02 DIAGNOSIS — D181 Lymphangioma, any site: Secondary | ICD-10-CM | POA: Diagnosis not present

## 2015-10-02 DIAGNOSIS — N390 Urinary tract infection, site not specified: Secondary | ICD-10-CM | POA: Diagnosis not present

## 2015-10-02 DIAGNOSIS — I10 Essential (primary) hypertension: Secondary | ICD-10-CM | POA: Diagnosis not present

## 2015-10-02 DIAGNOSIS — J189 Pneumonia, unspecified organism: Secondary | ICD-10-CM | POA: Diagnosis not present

## 2015-10-02 DIAGNOSIS — F0390 Unspecified dementia without behavioral disturbance: Secondary | ICD-10-CM | POA: Diagnosis not present

## 2015-10-03 DIAGNOSIS — J189 Pneumonia, unspecified organism: Secondary | ICD-10-CM | POA: Diagnosis not present

## 2015-10-03 DIAGNOSIS — N390 Urinary tract infection, site not specified: Secondary | ICD-10-CM | POA: Diagnosis not present

## 2015-10-03 DIAGNOSIS — I10 Essential (primary) hypertension: Secondary | ICD-10-CM | POA: Diagnosis not present

## 2015-10-03 DIAGNOSIS — F0390 Unspecified dementia without behavioral disturbance: Secondary | ICD-10-CM | POA: Diagnosis not present

## 2015-10-03 DIAGNOSIS — B9620 Unspecified Escherichia coli [E. coli] as the cause of diseases classified elsewhere: Secondary | ICD-10-CM | POA: Diagnosis not present

## 2015-10-03 DIAGNOSIS — D181 Lymphangioma, any site: Secondary | ICD-10-CM | POA: Diagnosis not present

## 2015-10-04 DIAGNOSIS — N3020 Other chronic cystitis without hematuria: Secondary | ICD-10-CM | POA: Diagnosis not present

## 2015-10-07 DIAGNOSIS — I10 Essential (primary) hypertension: Secondary | ICD-10-CM | POA: Diagnosis not present

## 2015-10-07 DIAGNOSIS — N390 Urinary tract infection, site not specified: Secondary | ICD-10-CM | POA: Diagnosis not present

## 2015-10-07 DIAGNOSIS — B9620 Unspecified Escherichia coli [E. coli] as the cause of diseases classified elsewhere: Secondary | ICD-10-CM | POA: Diagnosis not present

## 2015-10-07 DIAGNOSIS — J189 Pneumonia, unspecified organism: Secondary | ICD-10-CM | POA: Diagnosis not present

## 2015-10-07 DIAGNOSIS — D181 Lymphangioma, any site: Secondary | ICD-10-CM | POA: Diagnosis not present

## 2015-10-07 DIAGNOSIS — F0390 Unspecified dementia without behavioral disturbance: Secondary | ICD-10-CM | POA: Diagnosis not present

## 2015-10-09 DIAGNOSIS — F0390 Unspecified dementia without behavioral disturbance: Secondary | ICD-10-CM | POA: Diagnosis not present

## 2015-10-09 DIAGNOSIS — D181 Lymphangioma, any site: Secondary | ICD-10-CM | POA: Diagnosis not present

## 2015-10-09 DIAGNOSIS — B9620 Unspecified Escherichia coli [E. coli] as the cause of diseases classified elsewhere: Secondary | ICD-10-CM | POA: Diagnosis not present

## 2015-10-09 DIAGNOSIS — I10 Essential (primary) hypertension: Secondary | ICD-10-CM | POA: Diagnosis not present

## 2015-10-09 DIAGNOSIS — N390 Urinary tract infection, site not specified: Secondary | ICD-10-CM | POA: Diagnosis not present

## 2015-10-09 DIAGNOSIS — J189 Pneumonia, unspecified organism: Secondary | ICD-10-CM | POA: Diagnosis not present

## 2015-10-10 DIAGNOSIS — I10 Essential (primary) hypertension: Secondary | ICD-10-CM | POA: Diagnosis not present

## 2015-10-10 DIAGNOSIS — F0390 Unspecified dementia without behavioral disturbance: Secondary | ICD-10-CM | POA: Diagnosis not present

## 2015-10-10 DIAGNOSIS — D181 Lymphangioma, any site: Secondary | ICD-10-CM | POA: Diagnosis not present

## 2015-10-10 DIAGNOSIS — N390 Urinary tract infection, site not specified: Secondary | ICD-10-CM | POA: Diagnosis not present

## 2015-10-10 DIAGNOSIS — B9620 Unspecified Escherichia coli [E. coli] as the cause of diseases classified elsewhere: Secondary | ICD-10-CM | POA: Diagnosis not present

## 2015-10-10 DIAGNOSIS — J189 Pneumonia, unspecified organism: Secondary | ICD-10-CM | POA: Diagnosis not present

## 2015-10-14 DIAGNOSIS — N390 Urinary tract infection, site not specified: Secondary | ICD-10-CM | POA: Diagnosis not present

## 2015-10-14 DIAGNOSIS — F0390 Unspecified dementia without behavioral disturbance: Secondary | ICD-10-CM | POA: Diagnosis not present

## 2015-10-14 DIAGNOSIS — D181 Lymphangioma, any site: Secondary | ICD-10-CM | POA: Diagnosis not present

## 2015-10-14 DIAGNOSIS — I10 Essential (primary) hypertension: Secondary | ICD-10-CM | POA: Diagnosis not present

## 2015-10-14 DIAGNOSIS — B9620 Unspecified Escherichia coli [E. coli] as the cause of diseases classified elsewhere: Secondary | ICD-10-CM | POA: Diagnosis not present

## 2015-10-14 DIAGNOSIS — J189 Pneumonia, unspecified organism: Secondary | ICD-10-CM | POA: Diagnosis not present

## 2015-10-15 ENCOUNTER — Other Ambulatory Visit: Payer: Self-pay | Admitting: Family Medicine

## 2015-10-15 ENCOUNTER — Ambulatory Visit
Admission: RE | Admit: 2015-10-15 | Discharge: 2015-10-15 | Disposition: A | Discharge status: Home / Self Care | Payer: Medicare Other | Source: Ambulatory Visit | Attending: Family Medicine | Admitting: Family Medicine

## 2015-10-15 DIAGNOSIS — J189 Pneumonia, unspecified organism: Secondary | ICD-10-CM | POA: Diagnosis not present

## 2015-10-15 DIAGNOSIS — Z09 Encounter for follow-up examination after completed treatment for conditions other than malignant neoplasm: Secondary | ICD-10-CM

## 2015-10-16 DIAGNOSIS — N390 Urinary tract infection, site not specified: Secondary | ICD-10-CM | POA: Diagnosis not present

## 2015-10-16 DIAGNOSIS — J189 Pneumonia, unspecified organism: Secondary | ICD-10-CM | POA: Diagnosis not present

## 2015-10-16 DIAGNOSIS — D181 Lymphangioma, any site: Secondary | ICD-10-CM | POA: Diagnosis not present

## 2015-10-16 DIAGNOSIS — I10 Essential (primary) hypertension: Secondary | ICD-10-CM | POA: Diagnosis not present

## 2015-10-16 DIAGNOSIS — B9620 Unspecified Escherichia coli [E. coli] as the cause of diseases classified elsewhere: Secondary | ICD-10-CM | POA: Diagnosis not present

## 2015-10-16 DIAGNOSIS — F0390 Unspecified dementia without behavioral disturbance: Secondary | ICD-10-CM | POA: Diagnosis not present

## 2015-10-17 DIAGNOSIS — F0390 Unspecified dementia without behavioral disturbance: Secondary | ICD-10-CM | POA: Diagnosis not present

## 2015-10-17 DIAGNOSIS — J189 Pneumonia, unspecified organism: Secondary | ICD-10-CM | POA: Diagnosis not present

## 2015-10-17 DIAGNOSIS — B9620 Unspecified Escherichia coli [E. coli] as the cause of diseases classified elsewhere: Secondary | ICD-10-CM | POA: Diagnosis not present

## 2015-10-17 DIAGNOSIS — N390 Urinary tract infection, site not specified: Secondary | ICD-10-CM | POA: Diagnosis not present

## 2015-10-17 DIAGNOSIS — D181 Lymphangioma, any site: Secondary | ICD-10-CM | POA: Diagnosis not present

## 2015-10-17 DIAGNOSIS — I10 Essential (primary) hypertension: Secondary | ICD-10-CM | POA: Diagnosis not present

## 2015-10-18 DIAGNOSIS — F0390 Unspecified dementia without behavioral disturbance: Secondary | ICD-10-CM | POA: Diagnosis not present

## 2015-10-18 DIAGNOSIS — D181 Lymphangioma, any site: Secondary | ICD-10-CM | POA: Diagnosis not present

## 2015-10-18 DIAGNOSIS — N390 Urinary tract infection, site not specified: Secondary | ICD-10-CM | POA: Diagnosis not present

## 2015-10-18 DIAGNOSIS — I10 Essential (primary) hypertension: Secondary | ICD-10-CM | POA: Diagnosis not present

## 2015-10-18 DIAGNOSIS — B9620 Unspecified Escherichia coli [E. coli] as the cause of diseases classified elsewhere: Secondary | ICD-10-CM | POA: Diagnosis not present

## 2015-10-18 DIAGNOSIS — J189 Pneumonia, unspecified organism: Secondary | ICD-10-CM | POA: Diagnosis not present

## 2015-10-21 DIAGNOSIS — J189 Pneumonia, unspecified organism: Secondary | ICD-10-CM | POA: Diagnosis not present

## 2015-10-21 DIAGNOSIS — B9620 Unspecified Escherichia coli [E. coli] as the cause of diseases classified elsewhere: Secondary | ICD-10-CM | POA: Diagnosis not present

## 2015-10-21 DIAGNOSIS — I10 Essential (primary) hypertension: Secondary | ICD-10-CM | POA: Diagnosis not present

## 2015-10-21 DIAGNOSIS — D181 Lymphangioma, any site: Secondary | ICD-10-CM | POA: Diagnosis not present

## 2015-10-21 DIAGNOSIS — N390 Urinary tract infection, site not specified: Secondary | ICD-10-CM | POA: Diagnosis not present

## 2015-10-21 DIAGNOSIS — F0390 Unspecified dementia without behavioral disturbance: Secondary | ICD-10-CM | POA: Diagnosis not present

## 2015-10-22 DIAGNOSIS — N390 Urinary tract infection, site not specified: Secondary | ICD-10-CM | POA: Diagnosis not present

## 2015-10-22 DIAGNOSIS — I10 Essential (primary) hypertension: Secondary | ICD-10-CM | POA: Diagnosis not present

## 2015-10-22 DIAGNOSIS — B9620 Unspecified Escherichia coli [E. coli] as the cause of diseases classified elsewhere: Secondary | ICD-10-CM | POA: Diagnosis not present

## 2015-10-22 DIAGNOSIS — J189 Pneumonia, unspecified organism: Secondary | ICD-10-CM | POA: Diagnosis not present

## 2015-10-22 DIAGNOSIS — F0390 Unspecified dementia without behavioral disturbance: Secondary | ICD-10-CM | POA: Diagnosis not present

## 2015-10-22 DIAGNOSIS — D181 Lymphangioma, any site: Secondary | ICD-10-CM | POA: Diagnosis not present

## 2015-10-23 DIAGNOSIS — I10 Essential (primary) hypertension: Secondary | ICD-10-CM | POA: Diagnosis not present

## 2015-10-23 DIAGNOSIS — J189 Pneumonia, unspecified organism: Secondary | ICD-10-CM | POA: Diagnosis not present

## 2015-10-23 DIAGNOSIS — N390 Urinary tract infection, site not specified: Secondary | ICD-10-CM | POA: Diagnosis not present

## 2015-10-23 DIAGNOSIS — F0390 Unspecified dementia without behavioral disturbance: Secondary | ICD-10-CM | POA: Diagnosis not present

## 2015-10-23 DIAGNOSIS — B9620 Unspecified Escherichia coli [E. coli] as the cause of diseases classified elsewhere: Secondary | ICD-10-CM | POA: Diagnosis not present

## 2015-10-23 DIAGNOSIS — D181 Lymphangioma, any site: Secondary | ICD-10-CM | POA: Diagnosis not present

## 2015-10-24 DIAGNOSIS — I10 Essential (primary) hypertension: Secondary | ICD-10-CM | POA: Diagnosis not present

## 2015-10-24 DIAGNOSIS — B9620 Unspecified Escherichia coli [E. coli] as the cause of diseases classified elsewhere: Secondary | ICD-10-CM | POA: Diagnosis not present

## 2015-10-24 DIAGNOSIS — N390 Urinary tract infection, site not specified: Secondary | ICD-10-CM | POA: Diagnosis not present

## 2015-10-24 DIAGNOSIS — J189 Pneumonia, unspecified organism: Secondary | ICD-10-CM | POA: Diagnosis not present

## 2015-10-24 DIAGNOSIS — F0390 Unspecified dementia without behavioral disturbance: Secondary | ICD-10-CM | POA: Diagnosis not present

## 2015-10-24 DIAGNOSIS — D181 Lymphangioma, any site: Secondary | ICD-10-CM | POA: Diagnosis not present

## 2015-11-22 DIAGNOSIS — I129 Hypertensive chronic kidney disease with stage 1 through stage 4 chronic kidney disease, or unspecified chronic kidney disease: Secondary | ICD-10-CM | POA: Diagnosis not present

## 2015-11-22 DIAGNOSIS — H409 Unspecified glaucoma: Secondary | ICD-10-CM | POA: Diagnosis not present

## 2015-11-22 DIAGNOSIS — S81801A Unspecified open wound, right lower leg, initial encounter: Secondary | ICD-10-CM | POA: Diagnosis not present

## 2015-11-22 DIAGNOSIS — F0390 Unspecified dementia without behavioral disturbance: Secondary | ICD-10-CM | POA: Diagnosis not present

## 2015-11-22 DIAGNOSIS — M150 Primary generalized (osteo)arthritis: Secondary | ICD-10-CM | POA: Diagnosis not present

## 2015-11-22 DIAGNOSIS — N183 Chronic kidney disease, stage 3 (moderate): Secondary | ICD-10-CM | POA: Diagnosis not present

## 2015-11-22 DIAGNOSIS — I679 Cerebrovascular disease, unspecified: Secondary | ICD-10-CM | POA: Diagnosis not present

## 2015-11-22 DIAGNOSIS — I482 Chronic atrial fibrillation: Secondary | ICD-10-CM | POA: Diagnosis not present

## 2015-11-22 DIAGNOSIS — E782 Mixed hyperlipidemia: Secondary | ICD-10-CM | POA: Diagnosis not present

## 2015-11-22 DIAGNOSIS — M8580 Other specified disorders of bone density and structure, unspecified site: Secondary | ICD-10-CM | POA: Diagnosis not present

## 2015-11-22 DIAGNOSIS — E039 Hypothyroidism, unspecified: Secondary | ICD-10-CM | POA: Diagnosis not present

## 2015-11-22 DIAGNOSIS — N390 Urinary tract infection, site not specified: Secondary | ICD-10-CM | POA: Diagnosis not present

## 2015-11-29 DIAGNOSIS — N3946 Mixed incontinence: Secondary | ICD-10-CM | POA: Diagnosis not present

## 2015-11-29 DIAGNOSIS — N3020 Other chronic cystitis without hematuria: Secondary | ICD-10-CM | POA: Diagnosis not present

## 2015-11-29 DIAGNOSIS — R350 Frequency of micturition: Secondary | ICD-10-CM | POA: Diagnosis not present

## 2015-12-10 DIAGNOSIS — N3000 Acute cystitis without hematuria: Secondary | ICD-10-CM | POA: Diagnosis not present

## 2015-12-10 DIAGNOSIS — R350 Frequency of micturition: Secondary | ICD-10-CM | POA: Diagnosis not present

## 2015-12-20 DIAGNOSIS — N183 Chronic kidney disease, stage 3 (moderate): Secondary | ICD-10-CM | POA: Diagnosis not present

## 2016-01-03 DIAGNOSIS — H401122 Primary open-angle glaucoma, left eye, moderate stage: Secondary | ICD-10-CM | POA: Diagnosis not present

## 2016-01-03 DIAGNOSIS — H401112 Primary open-angle glaucoma, right eye, moderate stage: Secondary | ICD-10-CM | POA: Diagnosis not present

## 2016-01-15 ENCOUNTER — Ambulatory Visit (INDEPENDENT_AMBULATORY_CARE_PROVIDER_SITE_OTHER): Payer: Medicare Other | Admitting: Podiatry

## 2016-01-15 ENCOUNTER — Encounter: Payer: Self-pay | Admitting: Podiatry

## 2016-01-15 DIAGNOSIS — M79676 Pain in unspecified toe(s): Secondary | ICD-10-CM

## 2016-01-15 DIAGNOSIS — B351 Tinea unguium: Secondary | ICD-10-CM

## 2016-01-16 NOTE — Progress Notes (Signed)
Patient ID: Katherine Miranda, female   DOB: [birth year], 82 y.o.   MRN: [id number]  Subjective: This patient presents today with her daughter present in the treatment room. The daughters requesting debridement of her mother's toenails which he says are deformed and thickened and cause her mother difficulty walking wearing shoes. Patient's daughter also states that there is some history of possible trauma to the right hallux toenail area exact time not totally known but estimated in the past 2 weeks resulting in a partial sloughing of the right hallux toenail. This patient was last evaluated in office on 01/02/2015.    Patient is difficulty responding and her daughter is present in the treatment room  Vascular: No peripheral edema noted bilaterally DP and PT pulses 2-4 bilaterally Capillary reflex immediate bilaterally  Neurological: Sensation to 10 g monofilament wire intact 4/5 bilaterally Vibratory sensation patient have difficulty responding Ankle reflex reactive bilaterally  Dermatological: Varicose veins ankles bilaterally The toenails are elongated, brittle, discolored, incurvated and tender to direct palpation 6-10 Ecchymosis distal right hallux without open wound The right hallux distal nail plate is partially sloughed  Musculoskeletal: No deformities noted  Assessment: Mild trauma distal right hallux with partial nail sloughed right hallux without any clinical sign of bacterial infection Symptomatic onychomycoses 6-10  Plan: No active treatment indicated for mild trauma distal right hallux Debridement of toenails mechanically and electrically without any bleeding Patient return as needed or debridement

## 2016-01-28 DIAGNOSIS — N3020 Other chronic cystitis without hematuria: Secondary | ICD-10-CM | POA: Diagnosis not present

## 2016-01-28 DIAGNOSIS — N3946 Mixed incontinence: Secondary | ICD-10-CM | POA: Diagnosis not present

## 2016-03-27 DIAGNOSIS — F0390 Unspecified dementia without behavioral disturbance: Secondary | ICD-10-CM | POA: Diagnosis not present

## 2016-03-27 DIAGNOSIS — I129 Hypertensive chronic kidney disease with stage 1 through stage 4 chronic kidney disease, or unspecified chronic kidney disease: Secondary | ICD-10-CM | POA: Diagnosis not present

## 2016-03-27 DIAGNOSIS — E039 Hypothyroidism, unspecified: Secondary | ICD-10-CM | POA: Diagnosis not present

## 2016-03-27 DIAGNOSIS — N183 Chronic kidney disease, stage 3 (moderate): Secondary | ICD-10-CM | POA: Diagnosis not present

## 2016-03-27 DIAGNOSIS — I482 Chronic atrial fibrillation: Secondary | ICD-10-CM | POA: Diagnosis not present

## 2016-03-27 DIAGNOSIS — Z23 Encounter for immunization: Secondary | ICD-10-CM | POA: Diagnosis not present

## 2016-03-27 DIAGNOSIS — E782 Mixed hyperlipidemia: Secondary | ICD-10-CM | POA: Diagnosis not present

## 2016-03-27 DIAGNOSIS — R410 Disorientation, unspecified: Secondary | ICD-10-CM | POA: Diagnosis not present

## 2016-04-03 DIAGNOSIS — N3020 Other chronic cystitis without hematuria: Secondary | ICD-10-CM | POA: Diagnosis not present

## 2016-04-03 DIAGNOSIS — N3946 Mixed incontinence: Secondary | ICD-10-CM | POA: Diagnosis not present

## 2016-04-03 DIAGNOSIS — R350 Frequency of micturition: Secondary | ICD-10-CM | POA: Diagnosis not present

## 2016-04-07 DIAGNOSIS — E871 Hypo-osmolality and hyponatremia: Secondary | ICD-10-CM | POA: Diagnosis not present

## 2016-04-27 DIAGNOSIS — E871 Hypo-osmolality and hyponatremia: Secondary | ICD-10-CM | POA: Diagnosis not present

## 2016-05-11 IMAGING — RF DG SWALLOWING FUNCTION
1 series · 17 of 24 positions shown · non-contrast
Comparison: None.

CLINICAL DATA: Dysphagia.  Assessment for diet upgrade.

EXAM:
MODIFIED BARIUM SWALLOW
TECHNIQUE: Different consistencies of barium were administered orally to the
patient by the Speech Pathologist. Imaging of the pharynx was
performed in the lateral projection.
FLUOROSCOPY TIME:  Radiation Exposure Index (as provided by the
fluoroscopic device):
If the device does not provide the exposure index:
Fluoroscopy Time:  2 minutes and 20 seconds
Number of Acquired Images:  Fluoroscopic study

[Series 1: run · 26 acquisitions, 17 frames shown]
[im 1/26]
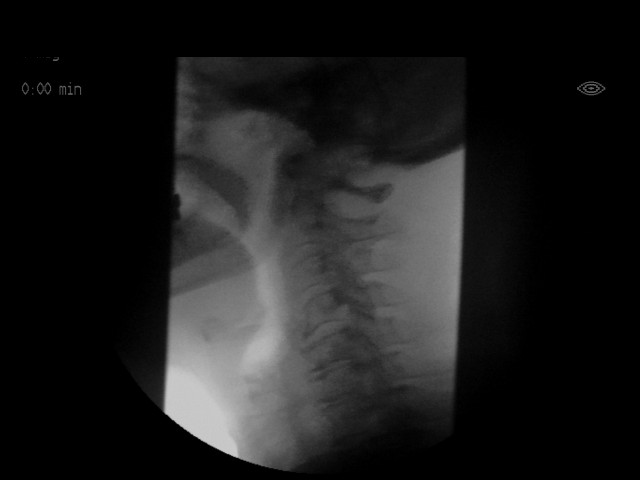
[im 3/26]
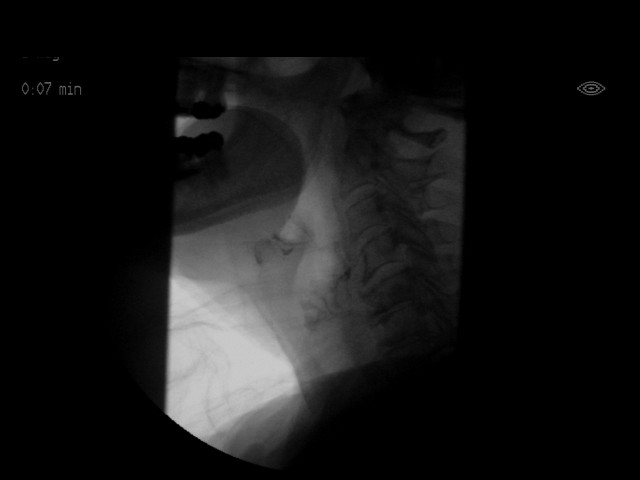
[im 4/26]
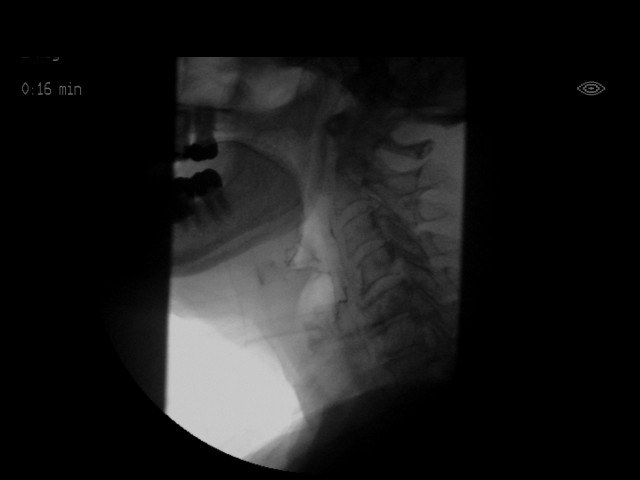
[im 5/26]
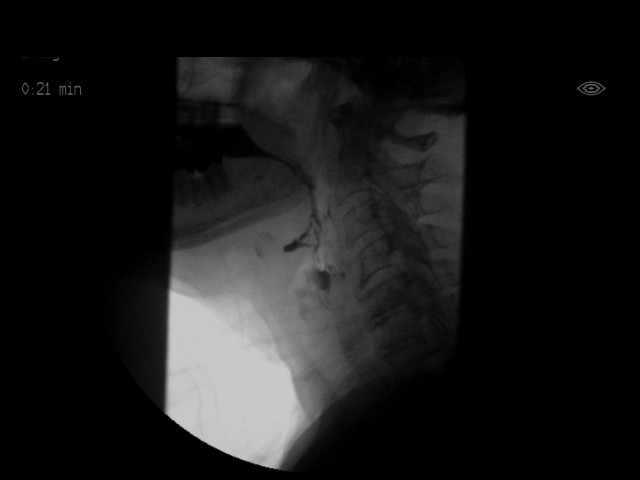
[im 7/26]
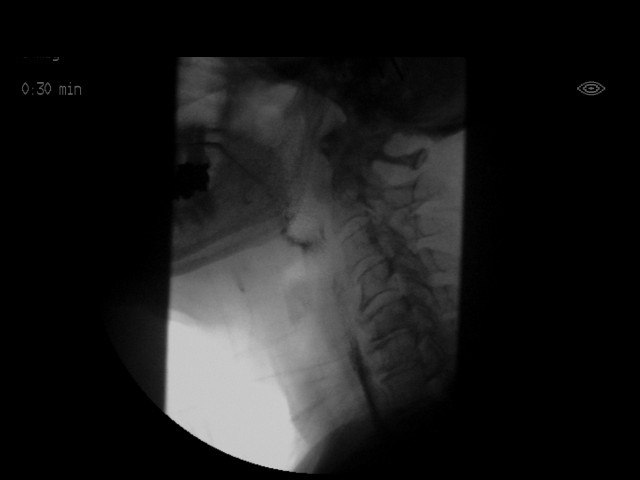
[im 8/26]
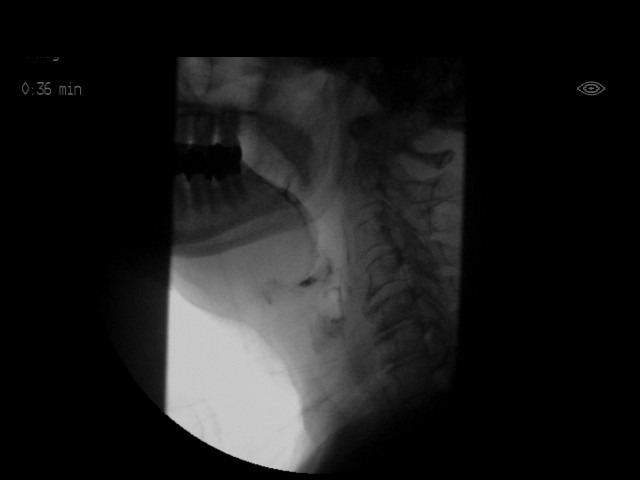
[im 10/26]
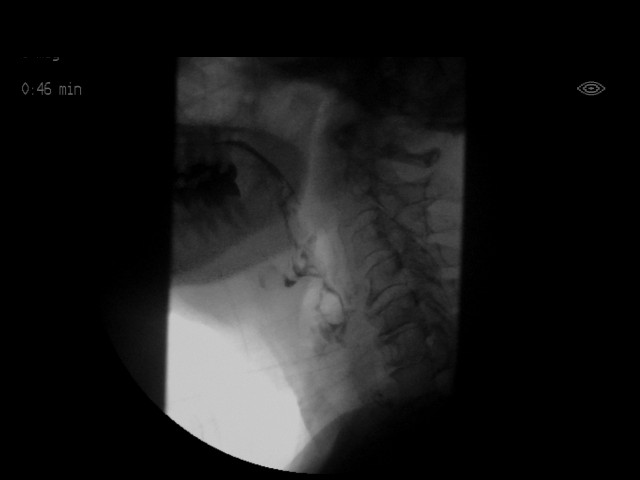
[im 11/26]
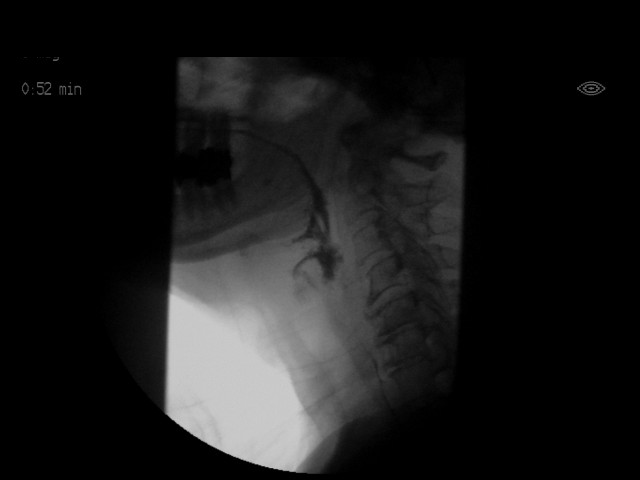
[im 14/26]
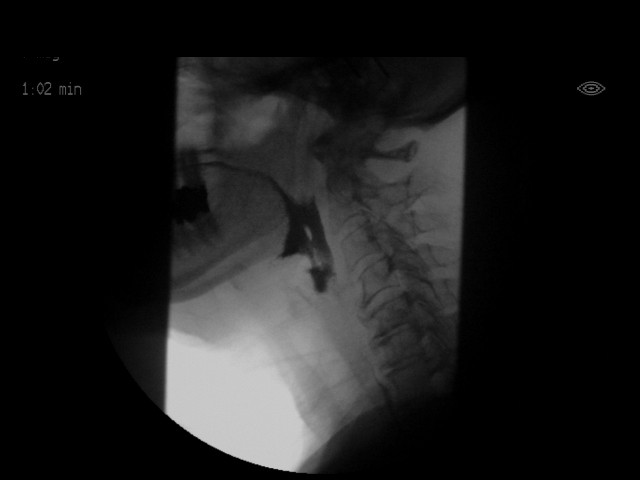
[im 15/26]
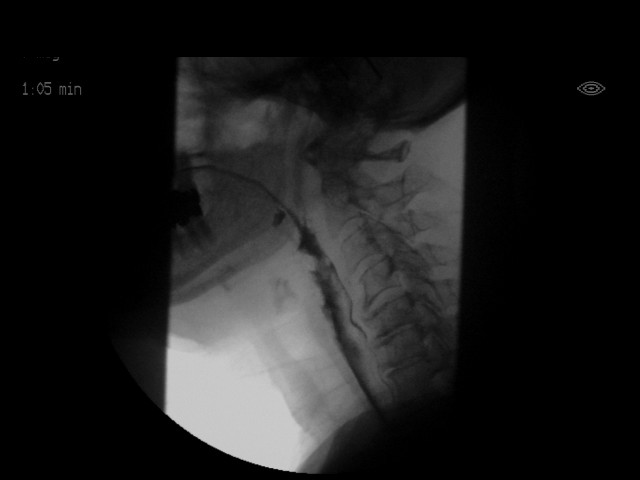
[im 16/26]
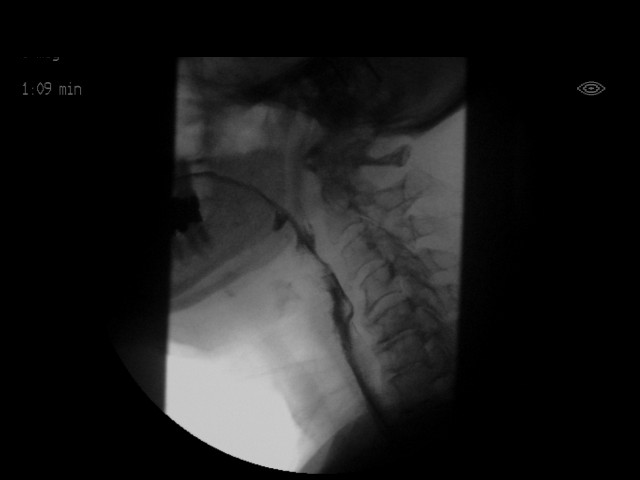
[im 18/26]
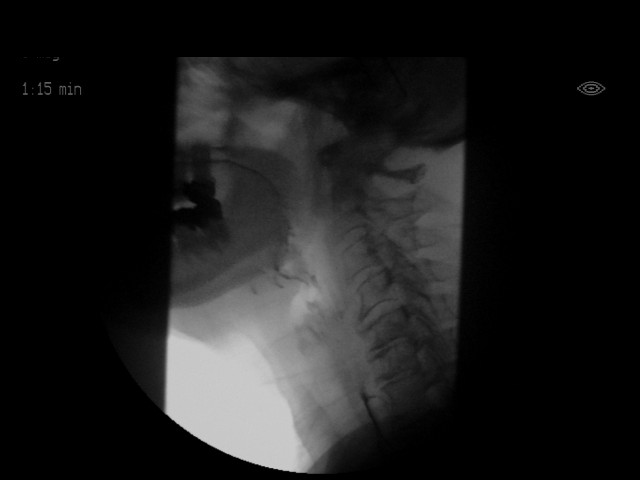
[im 19/26]
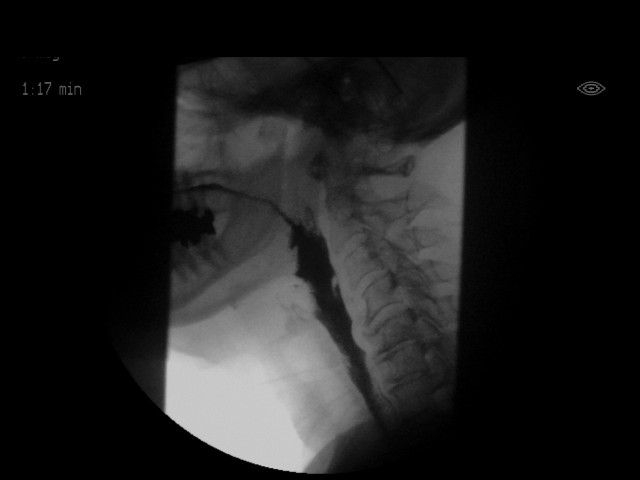
[im 21/26]
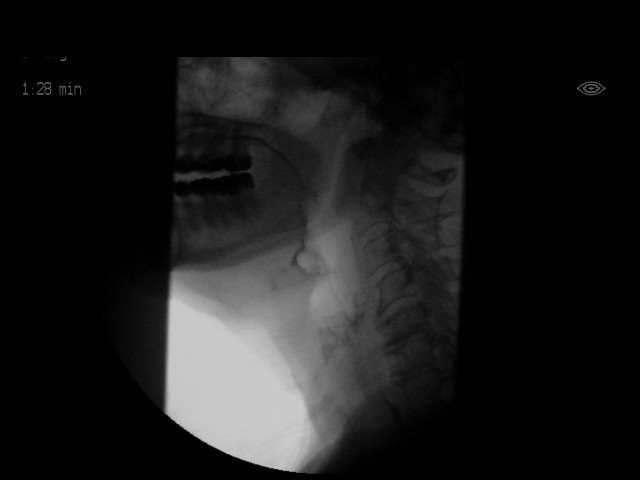
[im 22/26]
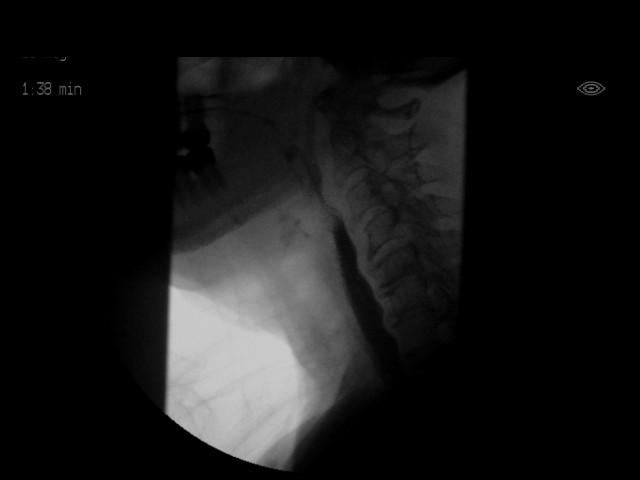
[im 23/26]
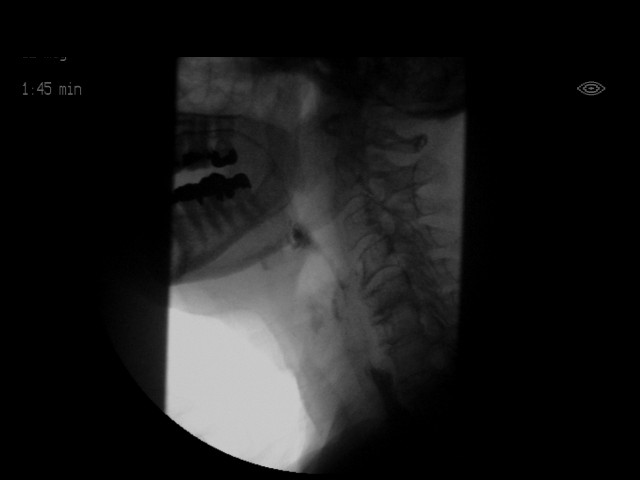
[im 26/26]
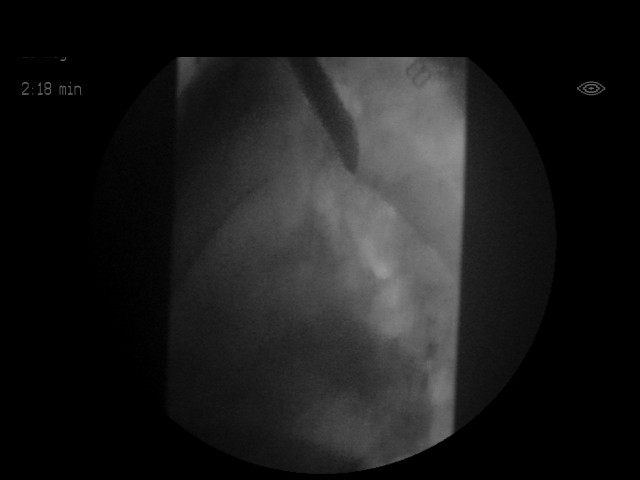

[17 of 24 positions shown; findings below may reference images not displayed]

FINDINGS: Thin liquid- minimal oral pharyngeal delay. No penetration or
aspiration. No significant vallecular pooling.

Nectar thick liquid- not administered.

Honey- not administered.

Viteli?Oshberg mild oropharyngeal delay.  No penetration or aspiration.

Viteli?Anjaly with cracker- mild oral pharyngeal delay. No penetration or
aspiration.

Barium tablet - patient attempted to swallow, but was not able to
swallow a barium tablet.
IMPRESSION: 1. Mild oral pharyngeal delay with all consistencies.
2. No penetration or aspiration.

Please refer to the Speech Pathologists report for complete details
and recommendations.

## 2016-06-26 DIAGNOSIS — E782 Mixed hyperlipidemia: Secondary | ICD-10-CM | POA: Diagnosis not present

## 2016-06-26 DIAGNOSIS — F0390 Unspecified dementia without behavioral disturbance: Secondary | ICD-10-CM | POA: Diagnosis not present

## 2016-06-26 DIAGNOSIS — N183 Chronic kidney disease, stage 3 (moderate): Secondary | ICD-10-CM | POA: Diagnosis not present

## 2016-06-26 DIAGNOSIS — I129 Hypertensive chronic kidney disease with stage 1 through stage 4 chronic kidney disease, or unspecified chronic kidney disease: Secondary | ICD-10-CM | POA: Diagnosis not present

## 2016-06-26 DIAGNOSIS — I482 Chronic atrial fibrillation: Secondary | ICD-10-CM | POA: Diagnosis not present

## 2016-06-26 DIAGNOSIS — E039 Hypothyroidism, unspecified: Secondary | ICD-10-CM | POA: Diagnosis not present

## 2016-06-29 ENCOUNTER — Encounter (HOSPITAL_COMMUNITY): Payer: Self-pay | Admitting: Emergency Medicine

## 2016-06-29 ENCOUNTER — Emergency Department (HOSPITAL_COMMUNITY): Payer: Medicare Other

## 2016-06-29 ENCOUNTER — Emergency Department (HOSPITAL_COMMUNITY)
Admission: EM | Admit: 2016-06-29 | Discharge: 2016-06-29 | Disposition: A | Discharge status: Home / Self Care | Payer: Medicare Other | Attending: Emergency Medicine | Admitting: Emergency Medicine

## 2016-06-29 DIAGNOSIS — N289 Disorder of kidney and ureter, unspecified: Secondary | ICD-10-CM | POA: Diagnosis not present

## 2016-06-29 DIAGNOSIS — F0390 Unspecified dementia without behavioral disturbance: Secondary | ICD-10-CM | POA: Insufficient documentation

## 2016-06-29 DIAGNOSIS — I10 Essential (primary) hypertension: Secondary | ICD-10-CM | POA: Diagnosis not present

## 2016-06-29 DIAGNOSIS — Z7901 Long term (current) use of anticoagulants: Secondary | ICD-10-CM | POA: Diagnosis not present

## 2016-06-29 DIAGNOSIS — Z950 Presence of cardiac pacemaker: Secondary | ICD-10-CM | POA: Diagnosis not present

## 2016-06-29 DIAGNOSIS — Z79899 Other long term (current) drug therapy: Secondary | ICD-10-CM | POA: Insufficient documentation

## 2016-06-29 DIAGNOSIS — N390 Urinary tract infection, site not specified: Secondary | ICD-10-CM | POA: Diagnosis not present

## 2016-06-29 DIAGNOSIS — R5383 Other fatigue: Secondary | ICD-10-CM | POA: Diagnosis present

## 2016-06-29 DIAGNOSIS — R404 Transient alteration of awareness: Secondary | ICD-10-CM | POA: Diagnosis not present

## 2016-06-29 DIAGNOSIS — R531 Weakness: Secondary | ICD-10-CM | POA: Diagnosis not present

## 2016-06-29 LAB — URINALYSIS, ROUTINE W REFLEX MICROSCOPIC
Bilirubin Urine: NEGATIVE
GLUCOSE, UA: NEGATIVE mg/dL
Hgb urine dipstick: NEGATIVE
Ketones, ur: NEGATIVE mg/dL
Nitrite: NEGATIVE
PH: 6 (ref 5.0–8.0)
PROTEIN: 30 mg/dL — AB
Specific Gravity, Urine: 1.012 (ref 1.005–1.030)

## 2016-06-29 LAB — COMPREHENSIVE METABOLIC PANEL
ALT: 28 U/L (ref 14–54)
AST: 24 U/L (ref 15–41)
Albumin: 4 g/dL (ref 3.5–5.0)
Alkaline Phosphatase: 64 U/L (ref 38–126)
Anion gap: 9 (ref 5–15)
BUN: 39 mg/dL — ABNORMAL HIGH (ref 6–20)
CO2: 28 mmol/L (ref 22–32)
Calcium: 9.4 mg/dL (ref 8.9–10.3)
Chloride: 98 mmol/L — ABNORMAL LOW (ref 101–111)
Creatinine, Ser: 1.55 mg/dL — ABNORMAL HIGH (ref 0.44–1.00)
GFR, EST AFRICAN AMERICAN: 32 mL/min — AB (ref >60)
GFR, EST NON AFRICAN AMERICAN: 28 mL/min — AB (ref >60)
Glucose, Bld: 104 mg/dL — ABNORMAL HIGH (ref 65–99)
POTASSIUM: 4.6 mmol/L (ref 3.5–5.1)
SODIUM: 135 mmol/L (ref 135–145)
Total Bilirubin: 0.7 mg/dL (ref 0.3–1.2)
Total Protein: 7.1 g/dL (ref 6.5–8.1)

## 2016-06-29 LAB — CBC WITH DIFFERENTIAL/PLATELET
BASOS ABS: 0 10*3/uL (ref 0.0–0.1)
Basophils Relative: 0 %
EOS ABS: 0.1 10*3/uL (ref 0.0–0.7)
EOS PCT: 2 %
HCT: 39.8 % (ref 36.0–46.0)
Hemoglobin: 13.8 g/dL (ref 12.0–15.0)
LYMPHS PCT: 23 %
Lymphs Abs: 1.4 10*3/uL (ref 0.7–4.0)
MCH: 29.5 pg (ref 26.0–34.0)
MCHC: 34.7 g/dL (ref 30.0–36.0)
MCV: 85 fL (ref 78.0–100.0)
MONO ABS: 0.7 10*3/uL (ref 0.1–1.0)
Monocytes Relative: 11 %
Neutro Abs: 3.7 10*3/uL (ref 1.7–7.7)
Neutrophils Relative %: 64 %
PLATELETS: 246 10*3/uL (ref 150–400)
RBC: 4.68 MIL/uL (ref 3.87–5.11)
RDW: 14.8 % (ref 11.5–15.5)
WBC: 5.9 10*3/uL (ref 4.0–10.5)

## 2016-06-29 LAB — TSH: TSH: 3.038 u[IU]/mL (ref 0.350–4.500)

## 2016-06-29 MED ORDER — CEPHALEXIN 500 MG PO CAPS: 500.0000 mg | ORAL_CAPSULE | Freq: Four times a day (QID) | ORAL | 0 refills | Status: DC

## 2016-06-29 NOTE — ED Triage Notes (Addendum)
Per EMS, patient from home, hx dementia, family reports '"patient has been walking a little less." Patient has no complaints. Per family patient has been eating regularly and has no changes in urine or output.

## 2016-06-29 NOTE — ED Notes (Signed)
Bed: [facility] Expected date:  Expected time:  Means of arrival:  Comments: 

## 2016-06-29 NOTE — ED Provider Notes (Signed)
[facility] DEPT Provider Note   CSN: [id number] Arrival date & time: 06/29/16  1557     History   Chief Complaint Chief Complaint  Patient presents with  . Fatigue    HPI Katherine Miranda is a 83 y.o. female.  Level V caveat for dementia. Most of history obtained from daughter. Patient states she is feeling fine. Daughter reports patient has been less active than usual. She is eating and drinking fluids. No fever, sweats, chills, chest pain, dyspnea, dysuria. She takes trimethoprim 100 mg daily for UTI prophylaxis. She has been able to walk with her walker, although slower than usual.      Past Medical History:  Diagnosis Date  . A-fib ([place])   . Arthritis   . Dementia   . Hypertension     Patient Active Problem List   Diagnosis Date Noted  . UTI (urinary tract infection) 09/19/2015  . Bilateral pneumonia 09/18/2015  . Delirium 09/18/2015  . Dysphagia 07/17/2015  . UTI (lower urinary tract infection) 07/15/2015  . Thyroid activity decreased   . Symptomatic bradycardia 04/20/2015  . Essential hypertension 09/19/2013  . Atrial fibrillation ([place]) 01/03/2013  . Subdural hygroma 10/06/2012  . Fall 10/06/2012  . Dementia 10/06/2012  . Hyponatremia 10/06/2012  . HTN (hypertension) 10/06/2012  . Dyslipidemia 10/06/2012    Past Surgical History:  Procedure Laterality Date  . BACK SURGERY    . CARDIAC CATHETERIZATION N/A 04/20/2015   Procedure: Temporary Pacemaker;  Surgeon: Lorretta Harp, MD;  Location: [facility];  Service: Cardiovascular;  Laterality: N/A;  . KNEE ARTHROSCOPY      OB History    No data available       Home Medications    Prior to Admission medications   Medication Sig Start Date End Date Taking? Authorizing Provider  alendronate (FOSAMAX) 70 MG tablet Take 70 mg by mouth every Tuesday. Take with a full glass of water on an empty stomach.   Yes Historical Provider, MD  amLODipine (NORVASC) 10 MG tablet Take 10 mg by mouth at  bedtime.   Yes Historical Provider, MD  Ascorbic Acid (VITAMIN C) 1000 MG tablet Take 1,000 mg by mouth 2 (two) times daily.    Yes Historical Provider, MD  Cleotis Lema, Uva-Ursi, (UVA URSI PO) Take 1 tablet by mouth 2 (two) times daily.    Yes Historical Provider, MD  CALCIUM PO Take 1 tablet by mouth 2 (two) times daily.    Yes Historical Provider, MD  Cholecalciferol (VITAMIN D) 2000 UNITS tablet Take 2,000 Units by mouth 2 (two) times daily.   Yes Historical Provider, MD  Coenzyme Q10 (COQ10) 200 MG CAPS Take 200 mg by mouth daily with breakfast.    Yes Historical Provider, MD  CRANBERRY PO Take 1 tablet by mouth daily with breakfast.    Yes Historical Provider, MD  donepezil (ARICEPT) 10 MG tablet Take 10 mg by mouth at bedtime.   Yes Historical Provider, MD  ELIQUIS 2.5 MG TABS tablet TAKE 1 TABLET TWICE A DAY 12/28/14  Yes Evans Lance, MD  guaiFENesin (MUCINEX) 600 MG 12 hr tablet Take 600 mg by mouth 2 (two) times daily as needed for cough or to loosen phlegm.   Yes Historical Provider, MD  IRON PO Take 1 tablet by mouth daily at 12 noon.   Yes Historical Provider, MD  levothyroxine (SYNTHROID, LEVOTHROID) 25 MCG tablet Take 1 tablet (25 mcg total) by mouth daily before breakfast. 04/24/15  Yes Ripudeep K Rai,  MD  lisinopril (PRINIVIL,ZESTRIL) 5 MG tablet Take 5 mg by mouth daily with breakfast.   Yes Historical Provider, MD  loratadine (CLARITIN) 10 MG tablet Take 10 mg by mouth at bedtime.   Yes Historical Provider, MD  memantine (NAMENDA) 10 MG tablet Take 10 mg by mouth at bedtime.    Yes Historical Provider, MD  Omega-3 Fatty Acids (FISH OIL) 1000 MG CAPS Take 1,000 mg by mouth daily with breakfast.    Yes Historical Provider, MD  Probiotic Product (PROBIOTIC PO) Take 1 tablet by mouth daily with breakfast.    Yes Historical Provider, MD  Travoprost, BAK Free, (TRAVATAN) 0.004 % SOLN ophthalmic solution Place 1 drop into both eyes at bedtime.   Yes Historical Provider, MD  trimethoprim  (TRIMPEX) 100 MG tablet Take 100 mg by mouth daily with breakfast.   Yes Historical Provider, MD  cephALEXin (KEFLEX) 500 MG capsule Take 1 capsule (500 mg total) by mouth 4 (four) times daily. 06/29/16   Nat Christen, MD    Family History No family history on file.  Social History Social History  Substance Use Topics  . Smoking status: Never Smoker  . Smokeless tobacco: Never Used  . Alcohol use No     Allergies   Patient has no known allergies.   Review of Systems Review of Systems  Reason unable to perform ROS: Dementia.     Physical Exam Updated Vital Signs BP 165/74 (BP Location: Right Arm)   Pulse 62   Resp 16   SpO2 97%   Physical Exam  Constitutional: She is oriented to person, place, and time. She appears well-developed and well-nourished.  Patient is nontoxic-appearing, pleasant  HENT:  Head: Normocephalic and atraumatic.  Eyes: Conjunctivae are normal.  Neck: Neck supple.  Cardiovascular: Normal rate and regular rhythm.   Pulmonary/Chest: Effort normal and breath sounds normal.  Abdominal: Soft. Bowel sounds are normal.  Musculoskeletal: Normal range of motion.  Neurological: She is alert and oriented to person, place, and time.  Skin: Skin is warm and dry.  Psychiatric:  Alert and oriented 2;  did not know day. Pleasant and interactive  Nursing note and vitals reviewed.    ED Treatments / Results  Labs (all labs ordered are listed, but only abnormal results are displayed) Labs Reviewed  COMPREHENSIVE METABOLIC PANEL - Abnormal; Notable for the following:       Result Value   Chloride 98 (*)    Glucose, Bld 104 (*)    BUN 39 (*)    Creatinine, Ser 1.55 (*)    GFR calc non Af Amer 28 (*)    GFR calc Af Amer 32 (*)    All other components within normal limits  URINALYSIS, ROUTINE W REFLEX MICROSCOPIC - Abnormal; Notable for the following:    APPearance HAZY (*)    Protein, ur 30 (*)    Leukocytes, UA SMALL (*)    Bacteria, UA RARE (*)     Squamous Epithelial / LPF 0-5 (*)    All other components within normal limits  CBC WITH DIFFERENTIAL/PLATELET  TSH    EKG  EKG Interpretation None       Radiology Dg Chest 2 View  Result Date: 06/29/2016 CLINICAL DATA:  Per EMS, patient from home, hx dementia, family reports '"patient has been walking a little less." Patient has no complaints. Per family patient has been eating regularly and has no changes in urine or output. - hx afib - hx hypertension - never a smoker  EXAM: CHEST  2 VIEW COMPARISON:  10/15/2015 FINDINGS: Normal cardiac silhouette. No effusion, infiltrate or pneumothorax. Degenerative osteophytosis of the spine. IMPRESSION: No acute cardiopulmonary process. Electronically Signed   By: Suzy Bouchard M.D.   On: 06/29/2016 17:00   Ct Head Wo Contrast  Result Date: 06/29/2016 CLINICAL DATA:  Weakness the EXAM: CT HEAD WITHOUT CONTRAST TECHNIQUE: Contiguous axial images were obtained from the base of the skull through the vertex without intravenous contrast. COMPARISON:  CT head dated 07/15/2015. FINDINGS: Brain: Again noted is generalized age related parenchymal atrophy with commensurate dilatation of the ventricles and sulci. Confluent areas of low density are again seen throughout the bilateral periventricular and subcortical white matter regions consistent with chronic small vessel ischemia. Small old lacunar infarcts again noted within the basal ganglia regions bilaterally. There is no mass, hemorrhage, edema or other evidence of acute parenchymal abnormality. No extra-axial hemorrhage. Vascular: There are chronic calcified atherosclerotic changes of the large vessels at the skull base. No unexpected hyperdense vessel. Skull: Normal. Negative for fracture or focal lesion. Sinuses/Orbits: No acute finding. Other: None. IMPRESSION: 1. No acute findings.  No intracranial mass, hemorrhage or edema. 2. Atrophy and chronic ischemic changes as detailed above. Electronically Signed    By: Franki Cabot M.D.   On: 06/29/2016 17:11    Procedures Procedures (including critical care time)  Medications Ordered in ED Medications - No data to display   Initial Impression / Assessment and Plan / ED Course  I have reviewed the triage vital signs and the nursing notes.  Pertinent labs & imaging results that were available during my care of the patient were reviewed by me and considered in my medical decision making (see chart for details).     Patient is in no acute distress. Creatinine noted to be elevated. Additionally, there may be some minor evidence for a urinary infection. Will Rx Keflex 500 mg and culture urine. Discussed creatinine with the daughter and caregiver. They will follow-up with her primary care doctor later in the week for recheck.  Final Clinical Impressions(s) / ED Diagnoses   Final diagnoses:  Urinary tract infection without hematuria, site unspecified  Renal insufficiency    New Prescriptions Discharge Medication List as of 06/29/2016  7:11 PM    START taking these medications   Details  cephALEXin (KEFLEX) 500 MG capsule Take 1 capsule (500 mg total) by mouth 4 (four) times daily., Starting Mon 06/29/2016, Print         Nat Christen, MD 06/29/16 2029

## 2016-06-29 NOTE — ED Notes (Signed)
ED Provider at bedside. 

## 2016-06-29 NOTE — Discharge Instructions (Signed)
Your creatinine which is a measure of kidney function was slightly elevated at 1.55.   This will need need to be rechecked later this week. Additionally, you have minor evidence of a urinary infection. Increase fluids. Prescription for antibiotic. Follow-up your primary care doctor.

## 2016-07-03 DIAGNOSIS — H524 Presbyopia: Secondary | ICD-10-CM | POA: Diagnosis not present

## 2016-07-03 DIAGNOSIS — H401112 Primary open-angle glaucoma, right eye, moderate stage: Secondary | ICD-10-CM | POA: Diagnosis not present

## 2016-07-03 DIAGNOSIS — H25813 Combined forms of age-related cataract, bilateral: Secondary | ICD-10-CM | POA: Diagnosis not present

## 2016-07-09 DIAGNOSIS — N183 Chronic kidney disease, stage 3 (moderate): Secondary | ICD-10-CM | POA: Diagnosis not present

## 2016-07-09 DIAGNOSIS — N179 Acute kidney failure, unspecified: Secondary | ICD-10-CM | POA: Diagnosis not present

## 2016-07-23 DIAGNOSIS — N3020 Other chronic cystitis without hematuria: Secondary | ICD-10-CM | POA: Diagnosis not present

## 2016-08-12 DIAGNOSIS — I959 Hypotension, unspecified: Secondary | ICD-10-CM | POA: Diagnosis not present

## 2016-08-12 DIAGNOSIS — R5383 Other fatigue: Secondary | ICD-10-CM | POA: Diagnosis not present

## 2016-08-12 DIAGNOSIS — R270 Ataxia, unspecified: Secondary | ICD-10-CM | POA: Diagnosis not present

## 2016-08-12 DIAGNOSIS — F0390 Unspecified dementia without behavioral disturbance: Secondary | ICD-10-CM | POA: Diagnosis not present

## 2016-08-18 DIAGNOSIS — I340 Nonrheumatic mitral (valve) insufficiency: Secondary | ICD-10-CM | POA: Diagnosis not present

## 2016-08-18 DIAGNOSIS — I129 Hypertensive chronic kidney disease with stage 1 through stage 4 chronic kidney disease, or unspecified chronic kidney disease: Secondary | ICD-10-CM | POA: Diagnosis not present

## 2016-08-18 DIAGNOSIS — Z7901 Long term (current) use of anticoagulants: Secondary | ICD-10-CM | POA: Diagnosis not present

## 2016-08-18 DIAGNOSIS — N183 Chronic kidney disease, stage 3 (moderate): Secondary | ICD-10-CM | POA: Diagnosis not present

## 2016-08-18 DIAGNOSIS — F0390 Unspecified dementia without behavioral disturbance: Secondary | ICD-10-CM | POA: Diagnosis not present

## 2016-08-18 DIAGNOSIS — R270 Ataxia, unspecified: Secondary | ICD-10-CM | POA: Diagnosis not present

## 2016-08-18 DIAGNOSIS — I7300 Raynaud's syndrome without gangrene: Secondary | ICD-10-CM | POA: Diagnosis not present

## 2016-08-18 DIAGNOSIS — I4891 Unspecified atrial fibrillation: Secondary | ICD-10-CM | POA: Diagnosis not present

## 2016-08-18 DIAGNOSIS — M810 Age-related osteoporosis without current pathological fracture: Secondary | ICD-10-CM | POA: Diagnosis not present

## 2016-08-21 DIAGNOSIS — I7300 Raynaud's syndrome without gangrene: Secondary | ICD-10-CM | POA: Diagnosis not present

## 2016-08-21 DIAGNOSIS — N183 Chronic kidney disease, stage 3 (moderate): Secondary | ICD-10-CM | POA: Diagnosis not present

## 2016-08-21 DIAGNOSIS — R270 Ataxia, unspecified: Secondary | ICD-10-CM | POA: Diagnosis not present

## 2016-08-21 DIAGNOSIS — I129 Hypertensive chronic kidney disease with stage 1 through stage 4 chronic kidney disease, or unspecified chronic kidney disease: Secondary | ICD-10-CM | POA: Diagnosis not present

## 2016-08-21 DIAGNOSIS — F0390 Unspecified dementia without behavioral disturbance: Secondary | ICD-10-CM | POA: Diagnosis not present

## 2016-08-21 DIAGNOSIS — M810 Age-related osteoporosis without current pathological fracture: Secondary | ICD-10-CM | POA: Diagnosis not present

## 2016-08-24 DIAGNOSIS — M810 Age-related osteoporosis without current pathological fracture: Secondary | ICD-10-CM | POA: Diagnosis not present

## 2016-08-24 DIAGNOSIS — R270 Ataxia, unspecified: Secondary | ICD-10-CM | POA: Diagnosis not present

## 2016-08-24 DIAGNOSIS — N183 Chronic kidney disease, stage 3 (moderate): Secondary | ICD-10-CM | POA: Diagnosis not present

## 2016-08-24 DIAGNOSIS — I129 Hypertensive chronic kidney disease with stage 1 through stage 4 chronic kidney disease, or unspecified chronic kidney disease: Secondary | ICD-10-CM | POA: Diagnosis not present

## 2016-08-24 DIAGNOSIS — F0390 Unspecified dementia without behavioral disturbance: Secondary | ICD-10-CM | POA: Diagnosis not present

## 2016-08-24 DIAGNOSIS — I7300 Raynaud's syndrome without gangrene: Secondary | ICD-10-CM | POA: Diagnosis not present

## 2016-08-25 DIAGNOSIS — N183 Chronic kidney disease, stage 3 (moderate): Secondary | ICD-10-CM | POA: Diagnosis not present

## 2016-08-25 DIAGNOSIS — M810 Age-related osteoporosis without current pathological fracture: Secondary | ICD-10-CM | POA: Diagnosis not present

## 2016-08-25 DIAGNOSIS — I129 Hypertensive chronic kidney disease with stage 1 through stage 4 chronic kidney disease, or unspecified chronic kidney disease: Secondary | ICD-10-CM | POA: Diagnosis not present

## 2016-08-25 DIAGNOSIS — F0390 Unspecified dementia without behavioral disturbance: Secondary | ICD-10-CM | POA: Diagnosis not present

## 2016-08-25 DIAGNOSIS — I7300 Raynaud's syndrome without gangrene: Secondary | ICD-10-CM | POA: Diagnosis not present

## 2016-08-25 DIAGNOSIS — R270 Ataxia, unspecified: Secondary | ICD-10-CM | POA: Diagnosis not present

## 2016-08-26 DIAGNOSIS — N183 Chronic kidney disease, stage 3 (moderate): Secondary | ICD-10-CM | POA: Diagnosis not present

## 2016-08-26 DIAGNOSIS — I129 Hypertensive chronic kidney disease with stage 1 through stage 4 chronic kidney disease, or unspecified chronic kidney disease: Secondary | ICD-10-CM | POA: Diagnosis not present

## 2016-08-26 DIAGNOSIS — I7300 Raynaud's syndrome without gangrene: Secondary | ICD-10-CM | POA: Diagnosis not present

## 2016-08-26 DIAGNOSIS — F0390 Unspecified dementia without behavioral disturbance: Secondary | ICD-10-CM | POA: Diagnosis not present

## 2016-08-26 DIAGNOSIS — M810 Age-related osteoporosis without current pathological fracture: Secondary | ICD-10-CM | POA: Diagnosis not present

## 2016-08-26 DIAGNOSIS — R270 Ataxia, unspecified: Secondary | ICD-10-CM | POA: Diagnosis not present

## 2016-08-27 ENCOUNTER — Emergency Department (HOSPITAL_COMMUNITY): Payer: Medicare Other

## 2016-08-27 ENCOUNTER — Inpatient Hospital Stay (HOSPITAL_COMMUNITY)
Admission: EM | Admit: 2016-08-27 | Discharge: 2016-08-31 | DRG: 178 | Disposition: A | Discharge status: Skilled Nursing Facility | Payer: Medicare Other | Attending: Family Medicine | Admitting: Family Medicine

## 2016-08-27 ENCOUNTER — Encounter (HOSPITAL_COMMUNITY): Payer: Self-pay

## 2016-08-27 DIAGNOSIS — I482 Chronic atrial fibrillation, unspecified: Secondary | ICD-10-CM | POA: Diagnosis present

## 2016-08-27 DIAGNOSIS — J690 Pneumonitis due to inhalation of food and vomit: Secondary | ICD-10-CM | POA: Diagnosis not present

## 2016-08-27 DIAGNOSIS — R05 Cough: Secondary | ICD-10-CM | POA: Diagnosis not present

## 2016-08-27 DIAGNOSIS — E039 Hypothyroidism, unspecified: Secondary | ICD-10-CM | POA: Diagnosis present

## 2016-08-27 DIAGNOSIS — N179 Acute kidney failure, unspecified: Secondary | ICD-10-CM | POA: Diagnosis not present

## 2016-08-27 DIAGNOSIS — R531 Weakness: Secondary | ICD-10-CM | POA: Diagnosis not present

## 2016-08-27 DIAGNOSIS — Z7901 Long term (current) use of anticoagulants: Secondary | ICD-10-CM

## 2016-08-27 DIAGNOSIS — E559 Vitamin D deficiency, unspecified: Secondary | ICD-10-CM | POA: Diagnosis present

## 2016-08-27 DIAGNOSIS — D509 Iron deficiency anemia, unspecified: Secondary | ICD-10-CM | POA: Diagnosis present

## 2016-08-27 DIAGNOSIS — F0390 Unspecified dementia without behavioral disturbance: Secondary | ICD-10-CM | POA: Diagnosis not present

## 2016-08-27 DIAGNOSIS — R404 Transient alteration of awareness: Secondary | ICD-10-CM | POA: Diagnosis not present

## 2016-08-27 DIAGNOSIS — R1310 Dysphagia, unspecified: Secondary | ICD-10-CM | POA: Diagnosis present

## 2016-08-27 DIAGNOSIS — J181 Lobar pneumonia, unspecified organism: Secondary | ICD-10-CM

## 2016-08-27 DIAGNOSIS — N183 Chronic kidney disease, stage 3 unspecified: Secondary | ICD-10-CM | POA: Diagnosis present

## 2016-08-27 DIAGNOSIS — Z66 Do not resuscitate: Secondary | ICD-10-CM | POA: Diagnosis not present

## 2016-08-27 DIAGNOSIS — Z87440 Personal history of urinary (tract) infections: Secondary | ICD-10-CM

## 2016-08-27 DIAGNOSIS — J189 Pneumonia, unspecified organism: Secondary | ICD-10-CM

## 2016-08-27 DIAGNOSIS — Z7983 Long term (current) use of bisphosphonates: Secondary | ICD-10-CM

## 2016-08-27 DIAGNOSIS — I480 Paroxysmal atrial fibrillation: Secondary | ICD-10-CM | POA: Diagnosis not present

## 2016-08-27 DIAGNOSIS — D649 Anemia, unspecified: Secondary | ICD-10-CM | POA: Diagnosis present

## 2016-08-27 DIAGNOSIS — K5900 Constipation, unspecified: Secondary | ICD-10-CM | POA: Diagnosis not present

## 2016-08-27 DIAGNOSIS — I10 Essential (primary) hypertension: Secondary | ICD-10-CM | POA: Diagnosis present

## 2016-08-27 DIAGNOSIS — E871 Hypo-osmolality and hyponatremia: Secondary | ICD-10-CM | POA: Diagnosis not present

## 2016-08-27 DIAGNOSIS — R630 Anorexia: Secondary | ICD-10-CM | POA: Diagnosis present

## 2016-08-27 DIAGNOSIS — I129 Hypertensive chronic kidney disease with stage 1 through stage 4 chronic kidney disease, or unspecified chronic kidney disease: Secondary | ICD-10-CM | POA: Diagnosis present

## 2016-08-27 DIAGNOSIS — E861 Hypovolemia: Secondary | ICD-10-CM | POA: Diagnosis present

## 2016-08-27 DIAGNOSIS — Z8249 Family history of ischemic heart disease and other diseases of the circulatory system: Secondary | ICD-10-CM

## 2016-08-27 LAB — CBC WITH DIFFERENTIAL/PLATELET
BASOS ABS: 0 10*3/uL (ref 0.0–0.1)
Basophils Relative: 0 %
EOS ABS: 0.1 10*3/uL (ref 0.0–0.7)
EOS PCT: 1 %
HCT: 31.7 % — ABNORMAL LOW (ref 36.0–46.0)
Hemoglobin: 11.1 g/dL — ABNORMAL LOW (ref 12.0–15.0)
LYMPHS PCT: 7 %
Lymphs Abs: 1 10*3/uL (ref 0.7–4.0)
MCH: 30 pg (ref 26.0–34.0)
MCHC: 35 g/dL (ref 30.0–36.0)
MCV: 85.7 fL (ref 78.0–100.0)
Monocytes Absolute: 1.2 10*3/uL — ABNORMAL HIGH (ref 0.1–1.0)
Monocytes Relative: 8 %
Neutro Abs: 11.7 10*3/uL — ABNORMAL HIGH (ref 1.7–7.7)
Neutrophils Relative %: 84 %
PLATELETS: 269 10*3/uL (ref 150–400)
RBC: 3.7 MIL/uL — AB (ref 3.87–5.11)
RDW: 14.8 % (ref 11.5–15.5)
WBC: 13.9 10*3/uL — AB (ref 4.0–10.5)

## 2016-08-27 LAB — I-STAT CG4 LACTIC ACID, ED: LACTIC ACID, VENOUS: 0.88 mmol/L (ref 0.5–1.9)

## 2016-08-27 MED ORDER — DEXTROSE 5 % IV SOLN
1.0000 g | Freq: Once | INTRAVENOUS | Status: AC
  Administered 2016-08-27: 1 g via INTRAVENOUS
  Filled 2016-08-27: qty 10

## 2016-08-27 MED ORDER — DEXTROSE 5 % IV SOLN
500.0000 mg | Freq: Once | INTRAVENOUS | Status: AC
  Administered 2016-08-27: 500 mg via INTRAVENOUS
  Filled 2016-08-27: qty 500

## 2016-08-27 MED ORDER — SODIUM CHLORIDE 0.9 % IV BOLUS (SEPSIS)
1000.0000 mL | Freq: Once | INTRAVENOUS | Status: AC
  Administered 2016-08-27: 1000 mL via INTRAVENOUS

## 2016-08-27 NOTE — ED Notes (Signed)
Bed: SU19 Expected date:  Expected time:  Means of arrival:  Comments: 83 yo F Cough

## 2016-08-27 NOTE — ED Triage Notes (Signed)
Pt arrived via EMS from home. Pt has had a productive cough with white sputum since last friday,  with increased weakness. Per EMS pt O2 sat was 92% RA. Pt was placed  2LPM of oxygen and saturation improved to 97%. Per pt family similar occurrence in the past and pt is usually positive for UTI. Per EMS pt family  wanted pt to be evaluated. Pt  alert x 2 at baseline. CBG 175

## 2016-08-27 NOTE — ED Provider Notes (Signed)
[facility] DEPT Provider Note   CSN: [id number] Arrival date & time: 08/27/16  2057  By signing my name below, I, Katherine Miranda, attest that this documentation has been prepared under the direction and in the presence of [name], MD. Electronically Signed: Oleh Miranda, Scribe. 08/27/16. 11:12 PM.   History   Chief Complaint Chief Complaint  Patient presents with  . Cough    HPI Katherine Miranda is a 83 y.o. female with history of A-fib not anticoagulated, hypertension, and dementia who presents to the ED for evaluation of generalized weakness. History is obtained from the patient's daughter who is at interview. She states that for the last "month" the patient has experienced a cough with clear sputum and generalized weakness. However in the last 2 days these symptoms have worsened. The patient walks with a walker at baseline; but in the last 2 weeks she is using a wheelchair due to weakness. At interview, the patient is also reporting some dyspnea. No documented fevers. She lives at home with daily home health. No chest pain or abdominal pain. No headaches. No leg swelling.   The history is provided by the patient. No language interpreter was used.    Past Medical History:  Diagnosis Date  . A-fib ([place])   . Arthritis   . Dementia   . Hypertension     Patient Active Problem List   Diagnosis Date Noted  . UTI (urinary tract infection) 09/19/2015  . Bilateral pneumonia 09/18/2015  . Delirium 09/18/2015  . Dysphagia 07/17/2015  . UTI (lower urinary tract infection) 07/15/2015  . Thyroid activity decreased   . Symptomatic bradycardia 04/20/2015  . Essential hypertension 09/19/2013  . Atrial fibrillation ([place]) 01/03/2013  . Subdural hygroma 10/06/2012  . Fall 10/06/2012  . Dementia 10/06/2012  . Hyponatremia 10/06/2012  . HTN (hypertension) 10/06/2012  . Dyslipidemia 10/06/2012    Past Surgical History:  Procedure Laterality Date  . BACK SURGERY    .  CARDIAC CATHETERIZATION N/A 04/20/2015   Procedure: Temporary Pacemaker;  Surgeon: Lorretta Harp, MD;  Location: [facility];  Service: Cardiovascular;  Laterality: N/A;  . KNEE ARTHROSCOPY      OB History    No data available       Home Medications    Prior to Admission medications   Medication Sig Start Date End Date Taking? Authorizing Provider  alendronate (FOSAMAX) 70 MG tablet Take 70 mg by mouth every Tuesday. Take with a full glass of water on an empty stomach.   Yes Historical Provider, MD  amLODipine (NORVASC) 10 MG tablet Take 10 mg by mouth at bedtime.   Yes Historical Provider, MD  Ascorbic Acid (VITAMIN C) 1000 MG tablet Take 1,000 mg by mouth 2 (two) times daily.    Yes Historical Provider, MD  Cleotis Lema, Uva-Ursi, (UVA URSI PO) Take 1 tablet by mouth 2 (two) times daily.    Yes Historical Provider, MD  CALCIUM PO Take 1 tablet by mouth 2 (two) times daily.    Yes Historical Provider, MD  Cholecalciferol (VITAMIN D) 2000 UNITS tablet Take 2,000 Units by mouth 2 (two) times daily.   Yes Historical Provider, MD  Coenzyme Q10 (COQ10) 200 MG CAPS Take 200 mg by mouth daily with breakfast.    Yes Historical Provider, MD  CRANBERRY PO Take 1 tablet by mouth daily with breakfast.    Yes Historical Provider, MD  donepezil (ARICEPT) 10 MG tablet Take 10 mg by mouth at bedtime.   Yes Historical  Provider, MD  ELIQUIS 2.5 MG TABS tablet TAKE 1 TABLET TWICE A DAY 12/28/14  Yes Evans Lance, MD  guaiFENesin (MUCINEX) 600 MG 12 hr tablet Take 600 mg by mouth 2 (two) times daily as needed for cough or to loosen phlegm.   Yes Historical Provider, MD  IRON PO Take 1 tablet by mouth daily at 12 noon.   Yes Historical Provider, MD  levothyroxine (SYNTHROID, LEVOTHROID) 25 MCG tablet Take 1 tablet (25 mcg total) by mouth daily before breakfast. Patient taking differently: Take 37.5 mcg by mouth daily before breakfast.  04/24/15  Yes Ripudeep K Rai, MD  loratadine (CLARITIN) 10 MG  tablet Take 10 mg by mouth at bedtime.   Yes Historical Provider, MD  memantine (NAMENDA) 10 MG tablet Take 10 mg by mouth at bedtime.    Yes Historical Provider, MD  Omega-3 Fatty Acids (FISH OIL) 1000 MG CAPS Take 1,000 mg by mouth daily with breakfast.    Yes Historical Provider, MD  Probiotic Product (PROBIOTIC PO) Take 1 tablet by mouth daily with breakfast.    Yes Historical Provider, MD  Travoprost, BAK Free, (TRAVATAN) 0.004 % SOLN ophthalmic solution Place 1 drop into both eyes at bedtime.   Yes Historical Provider, MD  trimethoprim (TRIMPEX) 100 MG tablet Take 100 mg by mouth daily with breakfast.   Yes Historical Provider, MD  cephALEXin (KEFLEX) 500 MG capsule Take 1 capsule (500 mg total) by mouth 4 (four) times daily. Patient not taking: Reported on 08/27/2016 06/29/16   Nat Christen, MD    Family History History reviewed. No pertinent family history.  Social History Social History  Substance Use Topics  . Smoking status: Never Smoker  . Smokeless tobacco: Never Used  . Alcohol use No     Allergies   Patient has no known allergies.   Review of Systems Review of Systems  Constitutional: Negative for fever.  Respiratory: Positive for cough and shortness of breath.   Cardiovascular: Negative for chest pain and leg swelling.  Gastrointestinal: Negative for abdominal pain and vomiting.  Neurological: Positive for weakness (globally weak). Negative for headaches.  All other systems reviewed and are negative.    Physical Exam Updated Vital Signs BP 140/64 (BP Location: Left Arm)   Pulse 76   Temp 97.8 F (36.6 C) (Oral)   Resp 19   SpO2 96%   Physical Exam  Constitutional: She appears well-developed and well-nourished.  HENT:  Head: Normocephalic and atraumatic.  Right Ear: External ear normal.  Left Ear: External ear normal.  Nose: Nose normal.  Eyes: Right eye exhibits no discharge. Left eye exhibits no discharge.  Cardiovascular: Normal rate, regular rhythm  and normal heart sounds.   Pulmonary/Chest: Effort normal. She has rales (RLL).  Abdominal: Soft. There is no tenderness.  Musculoskeletal: She exhibits no edema.  Neurological: She is alert.  Skin: Skin is warm and dry.  Nursing note and vitals reviewed.    ED Treatments / Results  Labs (all labs ordered are listed, but only abnormal results are displayed) Labs Reviewed  COMPREHENSIVE METABOLIC PANEL - Abnormal; Notable for the following:       Result Value   Sodium 131 (*)    Chloride 95 (*)    Glucose, Bld 102 (*)    BUN 42 (*)    Creatinine, Ser 1.33 (*)    GFR calc non Af Amer 33 (*)    GFR calc Af Amer 39 (*)    All other components within  normal limits  CBC WITH DIFFERENTIAL/PLATELET - Abnormal; Notable for the following:    WBC 13.9 (*)    RBC 3.70 (*)    Hemoglobin 11.1 (*)    HCT 31.7 (*)    Neutro Abs 11.7 (*)    Monocytes Absolute 1.2 (*)    All other components within normal limits  URINALYSIS, ROUTINE W REFLEX MICROSCOPIC - Abnormal; Notable for the following:    APPearance HAZY (*)    Hgb urine dipstick LARGE (*)    Protein, ur 30 (*)    Bacteria, UA RARE (*)    All other components within normal limits  BASIC METABOLIC PANEL - Abnormal; Notable for the following:    Sodium 134 (*)    BUN 31 (*)    Creatinine, Ser 1.10 (*)    Calcium 8.8 (*)    GFR calc non Af Amer 42 (*)    GFR calc Af Amer 49 (*)    All other components within normal limits  MRSA PCR SCREENING  CULTURE, BLOOD (ROUTINE X 2)  CULTURE, BLOOD (ROUTINE X 2)  URINE CULTURE  CULTURE, EXPECTORATED SPUTUM-ASSESSMENT  GRAM STAIN  STREP PNEUMONIAE URINARY ANTIGEN  CBC  I-STAT CG4 LACTIC ACID, ED    EKG  EKG Interpretation  Date/Time:  Thursday August 27 2016 23:33:44 EDT Ventricular Rate:  70 PR Interval:    QRS Duration: 84 QT Interval:  375 QTC Calculation: 405 R Axis:   -47 Text Interpretation:  Sinus rhythm Atrial premature complexes Left anterior fascicular block artifact  no significant change since 2017 Confirmed by Beatriz Settles MD, Tayja Manzer [phone]) on 08/27/2016 11:37:12 PM Also confirmed by Regenia Skeeter MD, [place] [phone]), editor [place]  CCT, [place] ([id number])  on 08/28/2016 7:42:29 AM       Radiology Dg Chest 2 View  Result Date: 08/27/2016 CLINICAL DATA:  Cough for 1 week, urinary tract infection EXAM: CHEST  2 VIEW COMPARISON:  CXR 06/29/2016 and 10/15/2015 FINDINGS: The heart size and mediastinal contours are within normal limits. There is aortic atherosclerosis at the arch. On the lateral projection, there is a curvilinear opacity just anterior to the lower thoracic spine slightly more prominent than on prior exams and could represent a vascular summation of the pulmonary arteries. Superimposed pneumonia would not be difficult to entirely exclude about the right hilum. No effusion nor overt pulmonary edema. IMPRESSION: Confluent opacity best seen on the lateral view about the hilum, likely on the right may represent a vascular summation of pulmonary vessels. Superimposed pneumonia cannot be entirely excluded. Electronically Signed   By: Ashley Royalty M.D.   On: 08/27/2016 23:01    Procedures Procedures (including critical care time)  Medications Ordered in ED Medications - No data to display   Initial Impression / Assessment and Plan / ED Course  I have reviewed the triage vital signs and the nursing notes.  Pertinent labs & imaging results that were available during my care of the patient were reviewed by me and considered in my medical decision making (see chart for details).  Clinical Course as of Aug 28 2311  Thu Aug 27, 2016  2312 Pneumonia, screen for sepsis, Abx  [SG]    Clinical Course User Index [SG] [name], MD    Patient's workup c/w CAP. Given age, worsening weakness, sharper decline than what had earlier, admit for fluids, antibiotics. No respiratory distress, no signs of sepsis.  Final Clinical Impressions(s) / ED Diagnoses   Final  diagnoses:  Community acquired pneumonia of right lower  lobe of lung [facility])    New Prescriptions New Prescriptions   No medications on file   I personally performed the services described in this documentation, which was scribed in my presence. The recorded information has been reviewed and is accurate.    [name], MD 08/28/16 [phone]

## 2016-08-28 ENCOUNTER — Encounter (HOSPITAL_COMMUNITY): Payer: Self-pay | Admitting: Family Medicine

## 2016-08-28 DIAGNOSIS — E559 Vitamin D deficiency, unspecified: Secondary | ICD-10-CM | POA: Diagnosis present

## 2016-08-28 DIAGNOSIS — M810 Age-related osteoporosis without current pathological fracture: Secondary | ICD-10-CM | POA: Diagnosis not present

## 2016-08-28 DIAGNOSIS — Z7901 Long term (current) use of anticoagulants: Secondary | ICD-10-CM | POA: Diagnosis not present

## 2016-08-28 DIAGNOSIS — M1990 Unspecified osteoarthritis, unspecified site: Secondary | ICD-10-CM | POA: Diagnosis not present

## 2016-08-28 DIAGNOSIS — J181 Lobar pneumonia, unspecified organism: Secondary | ICD-10-CM | POA: Diagnosis not present

## 2016-08-28 DIAGNOSIS — R278 Other lack of coordination: Secondary | ICD-10-CM | POA: Diagnosis not present

## 2016-08-28 DIAGNOSIS — N183 Chronic kidney disease, stage 3 unspecified: Secondary | ICD-10-CM | POA: Diagnosis present

## 2016-08-28 DIAGNOSIS — M6281 Muscle weakness (generalized): Secondary | ICD-10-CM | POA: Diagnosis not present

## 2016-08-28 DIAGNOSIS — R630 Anorexia: Secondary | ICD-10-CM | POA: Diagnosis present

## 2016-08-28 DIAGNOSIS — J189 Pneumonia, unspecified organism: Secondary | ICD-10-CM | POA: Diagnosis present

## 2016-08-28 DIAGNOSIS — K5900 Constipation, unspecified: Secondary | ICD-10-CM | POA: Diagnosis not present

## 2016-08-28 DIAGNOSIS — F0390 Unspecified dementia without behavioral disturbance: Secondary | ICD-10-CM | POA: Diagnosis not present

## 2016-08-28 DIAGNOSIS — R1310 Dysphagia, unspecified: Secondary | ICD-10-CM | POA: Diagnosis present

## 2016-08-28 DIAGNOSIS — H401132 Primary open-angle glaucoma, bilateral, moderate stage: Secondary | ICD-10-CM | POA: Diagnosis not present

## 2016-08-28 DIAGNOSIS — J989 Respiratory disorder, unspecified: Secondary | ICD-10-CM | POA: Diagnosis not present

## 2016-08-28 DIAGNOSIS — I482 Chronic atrial fibrillation: Secondary | ICD-10-CM | POA: Diagnosis not present

## 2016-08-28 DIAGNOSIS — D649 Anemia, unspecified: Secondary | ICD-10-CM | POA: Diagnosis not present

## 2016-08-28 DIAGNOSIS — N390 Urinary tract infection, site not specified: Secondary | ICD-10-CM | POA: Diagnosis not present

## 2016-08-28 DIAGNOSIS — R1312 Dysphagia, oropharyngeal phase: Secondary | ICD-10-CM | POA: Diagnosis not present

## 2016-08-28 DIAGNOSIS — Z8249 Family history of ischemic heart disease and other diseases of the circulatory system: Secondary | ICD-10-CM | POA: Diagnosis not present

## 2016-08-28 DIAGNOSIS — J309 Allergic rhinitis, unspecified: Secondary | ICD-10-CM | POA: Diagnosis not present

## 2016-08-28 DIAGNOSIS — I10 Essential (primary) hypertension: Secondary | ICD-10-CM | POA: Diagnosis not present

## 2016-08-28 DIAGNOSIS — J690 Pneumonitis due to inhalation of food and vomit: Secondary | ICD-10-CM | POA: Diagnosis present

## 2016-08-28 DIAGNOSIS — D509 Iron deficiency anemia, unspecified: Secondary | ICD-10-CM | POA: Diagnosis present

## 2016-08-28 DIAGNOSIS — E871 Hypo-osmolality and hyponatremia: Secondary | ICD-10-CM | POA: Diagnosis not present

## 2016-08-28 DIAGNOSIS — R262 Difficulty in walking, not elsewhere classified: Secondary | ICD-10-CM | POA: Diagnosis not present

## 2016-08-28 DIAGNOSIS — Z7983 Long term (current) use of bisphosphonates: Secondary | ICD-10-CM | POA: Diagnosis not present

## 2016-08-28 DIAGNOSIS — Z87440 Personal history of urinary (tract) infections: Secondary | ICD-10-CM | POA: Diagnosis not present

## 2016-08-28 DIAGNOSIS — N179 Acute kidney failure, unspecified: Secondary | ICD-10-CM | POA: Diagnosis present

## 2016-08-28 DIAGNOSIS — R001 Bradycardia, unspecified: Secondary | ICD-10-CM | POA: Diagnosis not present

## 2016-08-28 DIAGNOSIS — I129 Hypertensive chronic kidney disease with stage 1 through stage 4 chronic kidney disease, or unspecified chronic kidney disease: Secondary | ICD-10-CM | POA: Diagnosis present

## 2016-08-28 DIAGNOSIS — E039 Hypothyroidism, unspecified: Secondary | ICD-10-CM | POA: Diagnosis not present

## 2016-08-28 DIAGNOSIS — I480 Paroxysmal atrial fibrillation: Secondary | ICD-10-CM | POA: Diagnosis present

## 2016-08-28 DIAGNOSIS — Z66 Do not resuscitate: Secondary | ICD-10-CM | POA: Diagnosis present

## 2016-08-28 DIAGNOSIS — E785 Hyperlipidemia, unspecified: Secondary | ICD-10-CM | POA: Diagnosis not present

## 2016-08-28 DIAGNOSIS — R05 Cough: Secondary | ICD-10-CM | POA: Diagnosis not present

## 2016-08-28 DIAGNOSIS — E861 Hypovolemia: Secondary | ICD-10-CM | POA: Diagnosis present

## 2016-08-28 LAB — URINALYSIS, ROUTINE W REFLEX MICROSCOPIC
Bilirubin Urine: NEGATIVE
GLUCOSE, UA: NEGATIVE mg/dL
KETONES UR: NEGATIVE mg/dL
Leukocytes, UA: NEGATIVE
NITRITE: NEGATIVE
PROTEIN: 30 mg/dL — AB
Specific Gravity, Urine: 1.015 (ref 1.005–1.030)
Squamous Epithelial / LPF: NONE SEEN
pH: 5 (ref 5.0–8.0)

## 2016-08-28 LAB — BASIC METABOLIC PANEL
ANION GAP: 7 (ref 5–15)
BUN: 31 mg/dL — ABNORMAL HIGH (ref 6–20)
CALCIUM: 8.8 mg/dL — AB (ref 8.9–10.3)
CO2: 26 mmol/L (ref 22–32)
Chloride: 101 mmol/L (ref 101–111)
Creatinine, Ser: 1.1 mg/dL — ABNORMAL HIGH (ref 0.44–1.00)
GFR calc Af Amer: 49 mL/min — ABNORMAL LOW (ref >60)
GFR calc non Af Amer: 42 mL/min — ABNORMAL LOW (ref >60)
GLUCOSE: 90 mg/dL (ref 65–99)
POTASSIUM: 4.3 mmol/L (ref 3.5–5.1)
Sodium: 134 mmol/L — ABNORMAL LOW (ref 135–145)

## 2016-08-28 LAB — CBC
HEMATOCRIT: 37.4 % (ref 36.0–46.0)
Hemoglobin: 13 g/dL (ref 12.0–15.0)
MCH: 29.8 pg (ref 26.0–34.0)
MCHC: 34.8 g/dL (ref 30.0–36.0)
MCV: 85.8 fL (ref 78.0–100.0)
PLATELETS: 203 10*3/uL (ref 150–400)
RBC: 4.36 MIL/uL (ref 3.87–5.11)
RDW: 15 % (ref 11.5–15.5)
WBC: 10.2 10*3/uL (ref 4.0–10.5)

## 2016-08-28 LAB — STREP PNEUMONIAE URINARY ANTIGEN: STREP PNEUMO URINARY ANTIGEN: NEGATIVE

## 2016-08-28 LAB — COMPREHENSIVE METABOLIC PANEL
ALT: 30 U/L (ref 14–54)
AST: 26 U/L (ref 15–41)
Albumin: 3.6 g/dL (ref 3.5–5.0)
Alkaline Phosphatase: 107 U/L (ref 38–126)
Anion gap: 9 (ref 5–15)
BUN: 42 mg/dL — AB (ref 6–20)
CHLORIDE: 95 mmol/L — AB (ref 101–111)
CO2: 27 mmol/L (ref 22–32)
Calcium: 9.2 mg/dL (ref 8.9–10.3)
Creatinine, Ser: 1.33 mg/dL — ABNORMAL HIGH (ref 0.44–1.00)
GFR, EST AFRICAN AMERICAN: 39 mL/min — AB (ref >60)
GFR, EST NON AFRICAN AMERICAN: 33 mL/min — AB (ref >60)
Glucose, Bld: 102 mg/dL — ABNORMAL HIGH (ref 65–99)
POTASSIUM: 4.5 mmol/L (ref 3.5–5.1)
SODIUM: 131 mmol/L — AB (ref 135–145)
Total Bilirubin: 0.8 mg/dL (ref 0.3–1.2)
Total Protein: 7.5 g/dL (ref 6.5–8.1)

## 2016-08-28 LAB — MRSA PCR SCREENING: MRSA by PCR: NEGATIVE

## 2016-08-28 MED ORDER — SODIUM CHLORIDE 0.9 % IV SOLN
INTRAVENOUS | Status: AC
  Administered 2016-08-28: 02:00:00 via INTRAVENOUS

## 2016-08-28 MED ORDER — ONDANSETRON HCL 4 MG PO TABS: 4.0000 mg | ORAL_TABLET | Freq: Four times a day (QID) | ORAL | Status: DC | PRN

## 2016-08-28 MED ORDER — LEVOTHYROXINE SODIUM 25 MCG PO TABS
37.5000 ug | ORAL_TABLET | Freq: Every day | ORAL | Status: DC
  Administered 2016-08-28 – 2016-08-31 (×4): 37.5 ug via ORAL
  Filled 2016-08-28 (×2): qty 1.5

## 2016-08-28 MED ORDER — ORAL CARE MOUTH RINSE
15.0000 mL | Freq: Two times a day (BID) | OROMUCOSAL | Status: DC
  Administered 2016-08-28 – 2016-08-31 (×4): 15 mL via OROMUCOSAL

## 2016-08-28 MED ORDER — FERROUS SULFATE 325 (65 FE) MG PO TABS
325.0000 mg | ORAL_TABLET | Freq: Every day | ORAL | Status: DC
  Administered 2016-08-28 – 2016-08-31 (×4): 325 mg via ORAL
  Filled 2016-08-28 (×4): qty 1

## 2016-08-28 MED ORDER — ACETAMINOPHEN 650 MG RE SUPP
650.0000 mg | Freq: Four times a day (QID) | RECTAL | Status: DC | PRN
Start: 2016-08-28 — End: 2016-08-31

## 2016-08-28 MED ORDER — APIXABAN 2.5 MG PO TABS
2.5000 mg | ORAL_TABLET | Freq: Two times a day (BID) | ORAL | Status: DC
  Administered 2016-08-28 – 2016-08-31 (×7): 2.5 mg via ORAL
  Filled 2016-08-28 (×7): qty 1

## 2016-08-28 MED ORDER — GUAIFENESIN ER 600 MG PO TB12
600.0000 mg | ORAL_TABLET | Freq: Two times a day (BID) | ORAL | Status: DC | PRN
  Administered 2016-08-29 – 2016-08-30 (×2): 600 mg via ORAL
  Filled 2016-08-28 (×2): qty 1

## 2016-08-28 MED ORDER — ACETAMINOPHEN 325 MG PO TABS: 650.0000 mg | ORAL_TABLET | Freq: Four times a day (QID) | ORAL | Status: DC | PRN

## 2016-08-28 MED ORDER — DONEPEZIL HCL 10 MG PO TABS
10.0000 mg | ORAL_TABLET | Freq: Every day | ORAL | Status: DC
  Administered 2016-08-28 – 2016-08-31 (×3): 10 mg via ORAL
  Filled 2016-08-28 (×3): qty 1

## 2016-08-28 MED ORDER — ONDANSETRON HCL 4 MG/2ML IJ SOLN: 4.0000 mg | Freq: Four times a day (QID) | INTRAMUSCULAR | Status: DC | PRN

## 2016-08-28 MED ORDER — DEXTROSE 5 % IV SOLN
1.0000 g | INTRAVENOUS | Status: DC
  Administered 2016-08-28: 1 g via INTRAVENOUS
  Filled 2016-08-28: qty 10

## 2016-08-28 MED ORDER — RISAQUAD PO CAPS
1.0000 | ORAL_CAPSULE | Freq: Every day | ORAL | Status: DC
  Administered 2016-08-28 – 2016-08-31 (×4): 1 via ORAL
  Filled 2016-08-28 (×4): qty 1

## 2016-08-28 MED ORDER — MEMANTINE HCL 10 MG PO TABS
10.0000 mg | ORAL_TABLET | Freq: Every day | ORAL | Status: DC
  Administered 2016-08-28 – 2016-08-31 (×3): 10 mg via ORAL
  Filled 2016-08-28 (×3): qty 1

## 2016-08-28 MED ORDER — DEXTROSE 5 % IV SOLN
500.0000 mg | INTRAVENOUS | Status: DC
  Administered 2016-08-28: 500 mg via INTRAVENOUS
  Filled 2016-08-28: qty 500

## 2016-08-28 NOTE — Evaluation (Signed)
Clinical/Bedside Swallow Evaluation Patient Details  Name: Katherine Miranda MRN: [id number] Date of Birth: 04-10-1921  Today's Date: 08/28/2016 Time: SLP Start Time (ACUTE ONLY): 1240 SLP Stop Time (ACUTE ONLY): 1308 SLP Time Calculation (min) (ACUTE ONLY): 28 min  Past Medical History:  Past Medical History:  Diagnosis Date  . A-fib ([place])   . Arthritis   . Dementia   . Hypertension    Past Surgical History:  Past Surgical History:  Procedure Laterality Date  . BACK SURGERY    . CARDIAC CATHETERIZATION N/A 04/20/2015   Procedure: Temporary Pacemaker;  Surgeon: Lorretta Harp, MD;  Location: [facility];  Service: Cardiovascular;  Laterality: N/A;  . KNEE ARTHROSCOPY     HPI:  Pt is a 83 year old with dementia, admitted with cough, progressive weakness, subacute decline over the last several months, no wheelchair bound.  CXR 2 view shows question of vascular summation of pulmonary vessels, pna cannot be excluded.  Pt has a history of dysphagia 1 year ago with esophageal involvement. Pts last MBS on 05/23/15 recommended a regular diet and thin liquids with multiple swallows.    Assessment / Plan / Recommendation Clinical Impression  Pt demonstrates minimal signs of dysphagia consistent with assessments in prior years. Pt is alert able to take sip sand bites with timely appearing response, no overt weakness noted. Occasional throat clear noted as well as baseline persistent cough. Discussed pts multitude of risk factors and potential for conservaitve plan of care reduce risk including oral care, positioning and implementation of a second swallow. Pts daughter seemed concerned her mother is significantly aspirating. WIll f/u with objective test for additional information to form plan of care. Will plan for MBS tomorrow.  SLP Visit Diagnosis: Dysphagia, oropharyngeal phase (R13.12)    Aspiration Risk  Moderate aspiration risk    Diet Recommendation Regular;Thin liquid    Liquid Administration via: Cup;Straw Medication Administration: Crushed with puree Supervision: Staff to assist with self feeding Compensations: Slow rate;Small sips/bites;Multiple dry swallows after each bite/sip Postural Changes: Seated upright at 90 degrees;Remain upright for at least 30 minutes after po intake    Other  Recommendations Oral Care Recommendations: Oral care QID Other Recommendations: Have oral suction available   Follow up Recommendations 24 hour supervision/assistance;Home health SLP      Frequency and Duration min 2x/week  2 weeks       Prognosis Prognosis for Safe Diet Advancement: Fair      Swallow Study   General HPI: Pt is a 83 year old with dementia, admitted with cough, progressive weakness, subacute decline over the last several months, no wheelchair bound.  CXR 2 view shows question of vascular summation of pulmonary vessels, pna cannot be excluded.  Pt has a history of dysphagia 1 year ago with esophageal involvement. Pts last MBS on 05/23/15 recommended a regular diet and thin liquids with multiple swallows.  Type of Study: Bedside Swallow Evaluation Previous Swallow Assessment: see HPI Diet Prior to this Study: Regular;Thin liquids Temperature Spikes Noted: No Respiratory Status: Nasal cannula History of Recent Intubation: No Behavior/Cognition: Alert;Cooperative Oral Cavity Assessment: Within Functional Limits Oral Care Completed by SLP: No Oral Cavity - Dentition: Adequate natural dentition Vision: Functional for self-feeding Self-Feeding Abilities: Able to feed self Patient Positioning: Upright in bed Baseline Vocal Quality: Normal Volitional Cough: Strong Volitional Swallow: Able to elicit    Oral/Motor/Sensory Function Overall Oral Motor/Sensory Function: Within functional limits   Ice Chips     Thin Liquid Thin Liquid:  Within functional limits Presentation: Cup;Straw    Nectar Thick Nectar Thick Liquid: Not tested   Honey Thick  Honey Thick Liquid: Not tested   Puree Puree: Within functional limits Presentation: Self Fed;Spoon   Solid   GO   Solid: Within functional limits       [facility], [place] CCC-SLP [phone]  Katherine Miranda, Katherine Miranda 08/28/2016,2:53 PM

## 2016-08-28 NOTE — Progress Notes (Signed)
PHARMACY NOTE -  ANTIBIOTIC RENAL DOSE ADJUSTMENT   Request received for Pharmacy to assist with antibiotic renal dose adjustment.  Patient has been initiated on Azithromycin 500mg  iv q24hr Ceftriaxone 1gm iv q24hr   for CAP. Current dosage is appropriate and need for further dosage adjustment appears unlikely at present. Will sign off at this time.  Please reconsult if a change in clinical status warrants re-evaluation of dosage.

## 2016-08-28 NOTE — H&P (Signed)
History and Physical  Patient Name: Katherine Miranda     DXA:[id number]    DOB: 08/29/1920    DOA: 08/27/2016 PCP: [name], MD   Patient coming from: Home  Chief Complaint: Cough, weakness  HPI: Katherine Miranda is a 83 y.o. female with a past medical history significant for dementia community dwelling, Afib on Eliquis, HTN, CKD baseline Cr 1.5 who presents with cough and progressive weakness for 3 days.  History is collected primarily from the patient's daughter at the bedside, because of the patient's dementia. Over the last several weeks to month, the daughter describes a subacute decline, gradually increasing weakness, to the point that the patient is mostly wheelchair bound. Her PCP recently increased her levothyroxine and stopped her lisinopril as a result.   In this setting then, in the last 4 days, the patient has developed a new cough, coughing up whitish sputum, and has become noticeably weaker, more listless, with decreased appetite, and decreased attention. Finally the patient's daughter brought her to the ER today.  ED course: -Temp 97.50F, heart rate and respirations normal, pulse oximetry 90% on room air, blood pressure 140/64 -Na 131, K 4.5, Cr 1.33 (baseline 0.9-1.5), WBC 13.9K, Hgb 11 -Lactic acid 0.88 -CXR showed a right middle lobe opacity -Blood cultures were obtained, fluids and Ceftriaxone/azithromycin were administered and TRH were asked to evaluate for pneumonia     ROS: Review of Systems  Unable to perform ROS: Dementia  Constitutional: Positive for malaise/fatigue.  Respiratory: Positive for cough.           Past Medical History:  Diagnosis Date  . A-fib ([place])   . Arthritis   . Dementia   . Hypertension     Past Surgical History:  Procedure Laterality Date  . BACK SURGERY    . CARDIAC CATHETERIZATION N/A 04/20/2015   Procedure: Temporary Pacemaker;  Surgeon: Lorretta Harp, MD;  Location: [facility];  Service: Cardiovascular;   Laterality: N/A;  . KNEE ARTHROSCOPY      Social History: Patient lives by herself with a 24/7 aide.  Her daughter lives down the street.  The patient walks with a walker until about the last month when she has been needing a wheelchair.  She is not a smoker.    No Known Allergies  Family history: family history includes Dementia in her brother; Diabetes in her sister; Heart disease in her father.  Prior to Admission medications   Medication Sig Start Date End Date Taking? Authorizing Provider  alendronate (FOSAMAX) 70 MG tablet Take 70 mg by mouth every Tuesday. Take with a full glass of water on an empty stomach.   Yes Historical Provider, MD  amLODipine (NORVASC) 10 MG tablet Take 10 mg by mouth at bedtime.   Yes Historical Provider, MD  Ascorbic Acid (VITAMIN C) 1000 MG tablet Take 1,000 mg by mouth 2 (two) times daily.    Yes Historical Provider, MD  Cleotis Lema, Uva-Ursi, (UVA URSI PO) Take 1 tablet by mouth 2 (two) times daily.    Yes Historical Provider, MD  CALCIUM PO Take 1 tablet by mouth 2 (two) times daily.    Yes Historical Provider, MD  Cholecalciferol (VITAMIN D) 2000 UNITS tablet Take 2,000 Units by mouth 2 (two) times daily.   Yes Historical Provider, MD  Coenzyme Q10 (COQ10) 200 MG CAPS Take 200 mg by mouth daily with breakfast.    Yes Historical Provider, MD  CRANBERRY PO Take 1 tablet by mouth daily with breakfast.  Yes Historical Provider, MD  donepezil (ARICEPT) 10 MG tablet Take 10 mg by mouth at bedtime.   Yes Historical Provider, MD  ELIQUIS 2.5 MG TABS tablet TAKE 1 TABLET TWICE A DAY 12/28/14  Yes Evans Lance, MD  guaiFENesin (MUCINEX) 600 MG 12 hr tablet Take 600 mg by mouth 2 (two) times daily as needed for cough or to loosen phlegm.   Yes Historical Provider, MD  IRON PO Take 1 tablet by mouth daily at 12 noon.   Yes Historical Provider, MD  levothyroxine (SYNTHROID, LEVOTHROID) 25 MCG tablet Take 1 tablet (25 mcg total) by mouth daily before  breakfast. Patient taking differently: Take 37.5 mcg by mouth daily before breakfast.  04/24/15  Yes Ripudeep K Rai, MD  loratadine (CLARITIN) 10 MG tablet Take 10 mg by mouth at bedtime.   Yes Historical Provider, MD  memantine (NAMENDA) 10 MG tablet Take 10 mg by mouth at bedtime.    Yes Historical Provider, MD  Omega-3 Fatty Acids (FISH OIL) 1000 MG CAPS Take 1,000 mg by mouth daily with breakfast.    Yes Historical Provider, MD  Probiotic Product (PROBIOTIC PO) Take 1 tablet by mouth daily with breakfast.    Yes Historical Provider, MD  Travoprost, BAK Free, (TRAVATAN) 0.004 % SOLN ophthalmic solution Place 1 drop into both eyes at bedtime.   Yes Historical Provider, MD  trimethoprim (TRIMPEX) 100 MG tablet Take 100 mg by mouth daily with breakfast.   Yes Historical Provider, MD       Physical Exam: BP 135/68 (BP Location: Right Arm)   Pulse 73   Temp 97.8 F (36.6 C) (Oral)   Resp 18   Wt 68 kg (150 lb)   SpO2 100%   BMI 24.96 kg/m  General appearance: Frail elderly adult female, awake but listless, mostly too tired to be able to respond to questions. Eyes: Anicteric, conjunctiva pink, lids and lashes normal. PERRL.    ENT: No nasal deformity, discharge, epistaxis.  Hearing poor. OP tacky dry without lesions.   Neck: No neck masses.  Trachea midline.  No thyromegaly/tenderness. Lymph: No cervical or supraclavicular lymphadenopathy. Skin: Flushed, moist, warm.  No jaundice.  No suspicious rashes or lesions. Cardiac: RRR, nl S1-S2, no murmurs appreciated.  Capillary refill is brisk.  JVP normal.  No LE edema.  Radial and DP pulses 2+ and symmetric. Respiratory: Normal rate.  No accessory muscle use.  Wheezes throughout, coarse sounds on right. Abdomen: Abdomen soft.  No TTP. No ascites, distension, hepatosplenomegaly.   MSK: No deformities or effusions.  No cyanosis or clubbing. Neuro: Cranial nerves grossly intact.  Sensation intact to light touch. Speech is fluent.  Muscle  strength 4+/5 and symmetric.    Psych: Sensorium intact and responding to questions, answers that she is at [employer], in [place], it is 1922, January, can't remember Ivor Costa, recognizes daughter, can't state where she lives or with whom.  Attention diminished. Affect flat.  Judgment and insight appear impaired.     Labs on Admission:  I have personally reviewed following labs and imaging studies: CBC:  Recent Labs Lab 08/27/16 2312  WBC 13.9*  NEUTROABS 11.7*  HGB 11.1*  HCT 31.7*  MCV 85.7  PLT 409   Basic Metabolic Panel:  Recent Labs Lab 08/27/16 2312  NA 131*  K 4.5  CL 95*  CO2 27  GLUCOSE 102*  BUN 42*  CREATININE 1.33*  CALCIUM 9.2   GFR: CrCl cannot be calculated (Unknown ideal weight.).  Liver  Function Tests:  Recent Labs Lab 08/27/16 2312  AST 26  ALT 30  ALKPHOS 107  BILITOT 0.8  PROT 7.5  ALBUMIN 3.6   No results for input(s): LIPASE, AMYLASE in the last 168 hours. No results for input(s): AMMONIA in the last 168 hours. Coagulation Profile: No results for input(s): INR, PROTIME in the last 168 hours. Cardiac Enzymes: No results for input(s): CKTOTAL, CKMB, CKMBINDEX, TROPONINI in the last 168 hours. BNP (last 3 results) No results for input(s): PROBNP in the last 8760 hours. HbA1C: No results for input(s): HGBA1C in the last 72 hours. CBG: No results for input(s): GLUCAP in the last 168 hours. Lipid Profile: No results for input(s): CHOL, HDL, LDLCALC, TRIG, CHOLHDL, LDLDIRECT in the last 72 hours. Thyroid Function Tests: No results for input(s): TSH, T4TOTAL, FREET4, T3FREE, THYROIDAB in the last 72 hours. Anemia Panel: No results for input(s): VITAMINB12, FOLATE, FERRITIN, TIBC, IRON, RETICCTPCT in the last 72 hours. Sepsis Labs: Lactic acid 0.88 Invalid input(s): PROCALCITONIN, LACTICIDVEN No results found for this or any previous visit (from the past 240 hour(s)).       Radiological Exams on Admission: Personally reviewed  CXR shows right lung opacity: Dg Chest 2 View  Result Date: 08/27/2016 CLINICAL DATA:  Cough for 1 week, urinary tract infection EXAM: CHEST  2 VIEW COMPARISON:  CXR 06/29/2016 and 10/15/2015 FINDINGS: The heart size and mediastinal contours are within normal limits. There is aortic atherosclerosis at the arch. On the lateral projection, there is a curvilinear opacity just anterior to the lower thoracic spine slightly more prominent than on prior exams and could represent a vascular summation of the pulmonary arteries. Superimposed pneumonia would not be difficult to entirely exclude about the right hilum. No effusion nor overt pulmonary edema. IMPRESSION: Confluent opacity best seen on the lateral view about the hilum, likely on the right may represent a vascular summation of pulmonary vessels. Superimposed pneumonia cannot be entirely excluded. Electronically Signed   By: Ashley Royalty M.D.   On: 08/27/2016 23:01    EKG: Independently reviewed. Rate 70, QTc 405, sinus, poor baseline, L axis.          Assessment/Plan  1. Pneumonia:  Without sepsis physiology.  PSI score 111, risk clas IV, inpatient admission warranted.   -Ceftriaxone/azithromycin -IV fluids -Chest physiotherapy -Sputum culture and follow blood culture -Urine antigens   2. Hyponatremia: Mild, hypovolemic. -Fluids and trend BMP  3. Chronic kidney disease: Unclear baseline.  0.9-1.5, suggesting some basleine CKD stage III or worse -Trend Cr  4. Hypertension:  BP normal at admission. Recently stopped lisinopril. -Hold amlodipine for now  5. Hypothyroidism:  Recently increased levothyroxine. -Continue levothyroxine  6. Recurrent UTIs:  -Hold trimethoprim while on antibiotics restart at discharge -Continue probiotic  7. Normocytic anemia:  Stable -Continue iron  7. Atrial fibrillation:  CHADS2-VASc 4.  Rate controlled. -Continue all questions  8. Dementia:  -Continue donepezil, memantine     DVT  prophylaxis: N/A  Code Status: DO NOT RESUSCITATE  Family Communication: Daughter at bedside.  Overnight plan discussed, code status confirmed.  Disposition Plan: Anticipate IV fluids and antibiotics. Chest PT and likely 2-3 days admission. Consults called: None Admission status: INPATIENT    Medical decision making: Patient seen at 1:30 AM on 08/28/2016.  The patient was discussed with Dr. Karl Luke.  What exists of the patient's chart was reviewed in depth and summarized above.  Clinical condition: hemodynamically stable at this time for med surg floor.  Edwin Dada [facility] Pager [phone]         At the time of admission, it appears that the appropriate admission status for this patient is INPATIENT. This is judged to be reasonable and necessary in order to provide the required intensity of service to ensure the patient's safety given the presenting symptoms, physical exam findings, and initial radiographic and laboratory data in the context of their chronic comorbidities.  Together, these circumstances are felt to place her at high risk for further clinical deterioration threatening life, limb, or organ.   Patient requires inpatient status due to high intensity of service, high risk for further deterioration and high frequency of surveillance required because of this acute illness that poses a threat to life, limb or bodily function.  Factors support inpatient status include pneumonia in the setting of advanced age, hypertension, chronic kidney disease stage III, anemia, and elevated BUN  I certify that at the point of admission it is my clinical judgment that the patient will require inpatient hospital care spanning beyond 2 midnights from the point of admission and that early discharge would result in unnecessary risk of decompensation and readmission or threat to life, limb or bodily function.

## 2016-08-28 NOTE — Progress Notes (Signed)
Patient seen and examined at bedside this morning was admitted by my colleague last night for concerns of pneumonia. She is currently on antibiotics and the chest x-ray shows right middle lobe pneumonia. Patient does not have any complaints she is pleasantly confused. Spoke with the patient's daughter [name] and she tells me at baseline patient does have somewhat advanced dementia and gets around the clock care at home. She is still on Eliquis for atrial fibrillation. Recently there have been concerns about her instability in terms of ambulation and intermittent increase in her confusion. She also tells me that patient does cough at times after trying to eat a meal but have not seen her physically aspirate. At this time will continue her current antibiotics, placed on aspiration precaution and get her speech swallow eval. Eventually she will also need physical therapy prior to getting discharge. Depending on how steady her gait is, will have to discuss risk and benefits of being on Eliquis. I have relayed this to her daughter.

## 2016-08-29 ENCOUNTER — Inpatient Hospital Stay (HOSPITAL_COMMUNITY): Payer: Medicare Other

## 2016-08-29 LAB — URINE CULTURE

## 2016-08-29 MED ORDER — SODIUM CHLORIDE 0.9 % IV SOLN
1.5000 g | Freq: Two times a day (BID) | INTRAVENOUS | Status: DC
  Administered 2016-08-29 – 2016-08-30 (×3): 1.5 g via INTRAVENOUS
  Filled 2016-08-29 (×3): qty 1.5

## 2016-08-29 MED ORDER — SODIUM CHLORIDE 0.9 % IV SOLN
INTRAVENOUS | Status: DC
  Administered 2016-08-30: 04:00:00 via INTRAVENOUS

## 2016-08-29 NOTE — Progress Notes (Signed)
Modified Barium Swallow Progress Note  Patient Details  Name: Katherine Miranda MRN: [id number] Date of Birth: [birth year]  Today's Date: 08/29/2016  Modified Barium Swallow completed.  Full report located under Chart Review in the Imaging Section.  Brief recommendations include the following:  Clinical Impression  Pt presents with mild oropharyngeal dysphagia without aspiration/penetration of any consistency tested.  Mildly delayed oral transiting noted.  Pharyngeal swallow was strong with puree/solids but with mild residuals of liquids.  Cued dry swallow helpful to decrease mild liquid residuals.    Delayed pharyngeal swallow noted with  cracker bolus spilling over epiglottis toward pyriform sinus region - with SlP cueing pt to swallow due to aspiration risk.    Using live video, educated pt and daughter to findings/recommendations.    Recommend soft/thin diet with precautions to mitigate aspiration.   Of note, pt did cough during MBS and no aspiration was observed at that time.     Swallow Evaluation Recommendations       SLP Diet Recommendations: Dysphagia 3 (Mech soft) solids;Thin liquid   Liquid Administration via: Cup;Straw   Medication Administration: Whole meds with puree   Supervision: Full supervision/cueing for compensatory strategies   Compensations: Slow rate;Small sips/bites (start meals with drinks, intermittent dry swallow)       Oral Care Recommendations: Oral care BID   Other Recommendations: Clarify dietary restrictions    Claudie Fisherman, [place] [facility] SLP [phone]

## 2016-08-29 NOTE — Progress Notes (Signed)
Katherine Miranda VOZ:[id number] DOB: 01/31/1921 DOA: 08/27/2016 PCP: [name], MD  Brief narrative: 58 ? History of recurrent urinary infections HTN Chronic atrial fibrillation with chads score of 45 on Elliquis hypothyroid Anemia Fe def Dysphagia-previously on dysphagia 2 diet Advanced dementia Prior history of bradycardia and hypothermia secondary to the blockade  Admitted 08/28/16 with concerns for subacute decline and found to have aspiration pneumonia based on history. White count was 13.9, BUN/creatinine elevated at 42/1.33 >baseline 16/0 0.8  Started on ceftriaxone and azithromycin and speech therapy has been consulted  Past medical history-As per Problem list Chart reviewed as below-   Consultants:    Procedures:    Antibiotics:  Ceftriaxone 3/30-3/31  Azithromycin 3/30-3/31  Unasyn 3/31   Subjective   Alert but only poorly oriented Cannot tell me much else Not claiming to be in pain knows her birthday   Objective    Interim History:   Telemetry:    Objective: Vitals:   08/28/16 0245 08/28/16 1430 08/28/16 2106 08/29/16 0556  BP: (!) 160/88 (!) 144/57 139/72 138/84  Pulse: 78 (!) 53 (!) 50 74  Resp: 18 16 14 16   Temp: 98.5 F (36.9 C) 97.5 F (36.4 C) 97.7 F (36.5 C) 98 F (36.7 C)  TempSrc: Oral Oral Oral Oral  SpO2: 100% 100% 100% 100%  Weight: 68 kg (149 lb 14.6 oz)     Height: 5\' 5"  (1.651 m)       Intake/Output Summary (Last 24 hours) at 08/29/16 0756 Last data filed at 08/29/16 0556  Gross per 24 hour  Intake              960 ml  Output                0 ml  Net              960 ml    Exam:  General: eomi ncat, frail, no ict Cardiovascular: s1 s2 no m/r/g Respiratory: clear no wheeze Abdomen: soft nt nd no rebound Skin no le edema Neuro-intact  Data Reviewed: Basic Metabolic Panel:  Recent Labs Lab 08/27/16 2312 08/28/16 0500  NA 131* 134*  K 4.5 4.3  CL 95* 101  CO2 27 26  GLUCOSE 102* 90  BUN  42* 31*  CREATININE 1.33* 1.10*  CALCIUM 9.2 8.8*   Liver Function Tests:  Recent Labs Lab 08/27/16 2312  AST 26  ALT 30  ALKPHOS 107  BILITOT 0.8  PROT 7.5  ALBUMIN 3.6   No results for input(s): LIPASE, AMYLASE in the last 168 hours. No results for input(s): AMMONIA in the last 168 hours. CBC:  Recent Labs Lab 08/27/16 2312 08/28/16 0500  WBC 13.9* 10.2  NEUTROABS 11.7*  --   HGB 11.1* 13.0  HCT 31.7* 37.4  MCV 85.7 85.8  PLT 269 203   Cardiac Enzymes: No results for input(s): CKTOTAL, CKMB, CKMBINDEX, TROPONINI in the last 168 hours. BNP: Invalid input(s): POCBNP CBG: No results for input(s): GLUCAP in the last 168 hours.  Recent Results (from the past 240 hour(s))  MRSA PCR Screening     Status: None   Collection Time: 08/28/16  4:36 AM  Result Value Ref Range Status   MRSA by PCR NEGATIVE NEGATIVE Final    Comment:        The GeneXpert MRSA Assay (FDA approved for NASAL specimens only), is one component of a comprehensive MRSA colonization surveillance program. It is not intended to diagnose MRSA infection nor to  guide or monitor treatment for MRSA infections.      Studies:              All Imaging reviewed and is as per above notation   Scheduled Meds: . acidophilus  1 capsule Oral Q breakfast  . apixaban  2.5 mg Oral BID  . azithromycin  500 mg Intravenous Q24H  . cefTRIAXone (ROCEPHIN)  IV  1 g Intravenous Q24H  . donepezil  10 mg Oral QHS  . ferrous sulfate  325 mg Oral Q1200  . levothyroxine  37.5 mcg Oral QAC breakfast  . mouth rinse  15 mL Mouth Rinse BID  . memantine  10 mg Oral QHS   Continuous Infusions:   Assessment/Plan:   Aspiration pneumonia without sepsis physiology-switch antibiotics from ceftriaxone and azithromycin to Unasyn. Speech therapy input pending. Recently was on a dysphagia 2 diet therefore start this back. Get CBC plus differential in a.m.. Will need ongoing education and family members and discussion about  goals of care at some point   Acute kidney injury-Baseline creatinine 0.8 on admission 1.5. IV fluids saline 50 cc/h-rpt labs in am  Paroxysmal atrial fibrillation on Elliquis-continue Elliquis 2.5 twice a day. Does not appear to be on rate controlling agent secondary to severe bradycardia in the past.  Chronic urinary tract infections-previously on trimethoprim 100 every morning for suppressive therapy. That is on hold now given IV antibiotic. As an outpatient resume cranberry by mouth..  Dementia moderate to severe-continue Namenda 10 daily at bedtime, Aricept 10 daily p.m.-will need an outpatient MMSE and will also need to have goals of care addressed given multiple recurrent admissions over the past year  Anemia likely iron deficiency/nutritional deficiency-continue by mouth iron 325 daily  Hypertension continue amlodipine 10 at bedtime  Continue Synthroid 37.5 g daily-outpatient TSH needed  VIt d def-holding calcium bid and vit d  lovenox D/w daugther Tele lijkely d/c 24-48 hr depending on resolution   Verneita Griffes, MD  [facility] Pager [phone] 08/29/2016, 7:56 AM    LOS: 1 day

## 2016-08-29 NOTE — Evaluation (Signed)
Physical Therapy Evaluation Patient Details Name: Katherine Miranda MRN: [id number] DOB: [birth year] Today's Date: 08/29/2016   History of Present Illness  83 yo female admitted with pna. Hx of dementia, Afib, HRN, recurrent UTI, arthritis.   Clinical Impression  On eval, pt required Mod assist for mobility. She performed a stand pivot and walked ~5 feet in room with a walker. No family present during session. Per chart, pt is from home with 24/7 caregivers. Recommend HHPT f/u as long as caregivers can provide current level of care. Will follow.     Follow Up Recommendations Home health PT;Supervision/Assistance - 24 hour (as long as caregivers can provide current level of care)    Equipment Recommendations  None recommended by PT    Recommendations for Other Services       Precautions / Restrictions Precautions Precautions: Fall Restrictions Weight Bearing Restrictions: No      Mobility  Bed Mobility Overal bed mobility: Needs Assistance Bed Mobility: Supine to Sit     Supine to sit: Min assist;HOB elevated     General bed mobility comments: Increased time. Mod multimodal cueing for initiation and completion of task. Assist for trunk.   Transfers Overall transfer level: Needs assistance Equipment used: Rolling walker (2 wheeled) Transfers: Sit to/from [facility] Sit to Stand: Min assist Stand pivot transfers: Mod assist       General transfer comment: Assist to rise, stabilize, control descent. Mod multimodal cues for safety, hand placement. Increased difficulty without armrests. Pt tends to try to sit before safely positioned in front of chair. Stand pivot, bed >bsc, with RW  Ambulation/Gait Ambulation/Gait assistance: Mod assist Ambulation Distance (Feet): 5 Feet Assistive device: Rolling walker (2 wheeled) Gait Pattern/deviations: Step-to pattern;Decreased step length - right;Trunk flexed     General Gait Details: Mod multimodal cueing for  posture, safety, direction. Assist to stabilize pt and maneuver safely with RW.   Stairs            Wheelchair Mobility    Modified Rankin (Stroke Patients Only)       Balance Overall balance assessment: Needs assistance         Standing balance support: Bilateral upper extremity supported Standing balance-Leahy Scale: Poor                               Pertinent Vitals/Pain Pain Assessment: Faces Faces Pain Scale: No hurt    Home Living Family/patient expects to be discharged to:: Private residence Living Arrangements: Alone Available Help at Discharge: Personal care attendant;Available 24 hours/day Type of Home: House Home Access: Ramped entrance     Home Layout: One level Home Equipment: Walker - 2 wheels;Walker - 4 wheels;Transport chair;Bedside commode;Shower seat - built in      Prior Function Level of Independence: Needs assistance   Gait / Transfers Assistance Needed: uses RW (per previous chart info)     Comments: caregivers are alwasy with her assisting her with all ADLS and mobility (per previous chart info)     Hand Dominance        Extremity/Trunk Assessment   Upper Extremity Assessment Upper Extremity Assessment: Generalized weakness    Lower Extremity Assessment Lower Extremity Assessment: Generalized weakness (difficulty advancing R LE)    Cervical / Trunk Assessment Cervical / Trunk Assessment: Kyphotic  Communication   Communication: No difficulties  Cognition Arousal/Alertness: Awake/alert Behavior During Therapy: WFL for tasks assessed/performed Overall Cognitive Status: History of cognitive impairments -  at baseline                                        General Comments      Exercises     Assessment/Plan    PT Assessment Patient needs continued PT services  PT Problem List Decreased strength;Decreased mobility;Decreased balance;Decreased knowledge of use of DME;Decreased  cognition;Decreased safety awareness       PT Treatment Interventions DME instruction;Gait training;Therapeutic activities;Therapeutic exercise;Patient/family education;Functional mobility training;Balance training    PT Goals (Current goals can be found in the Care Plan section)  Acute Rehab PT Goals Patient Stated Goal: none stated-pt unable PT Goal Formulation: Patient unable to participate in goal setting Time For Goal Achievement: 09/19/16 Potential to Achieve Goals: Fair    Frequency Min 3X/week   Barriers to discharge        Co-evaluation               End of Session Equipment Utilized During Treatment: Gait belt;Oxygen Activity Tolerance: Patient tolerated treatment well Patient left: in chair;with call bell/phone within reach;with chair alarm set   PT Visit Diagnosis: Muscle weakness (generalized) (M62.81);Difficulty in walking, not elsewhere classified (R26.2)    Time: 1610-9604 PT Time Calculation (min) (ACUTE ONLY): 24 min   Charges:   PT Evaluation $PT Eval Low Complexity: 1 Procedure PT Treatments $Therapeutic Activity: 8-22 mins   PT G Codes:          Weston Anna, MPT Pager: [phone]

## 2016-08-30 MED ORDER — SODIUM CHLORIDE 0.9 % IV SOLN
INTRAVENOUS | Status: AC
  Administered 2016-08-30: 1000 mL via INTRAVENOUS

## 2016-08-30 MED ORDER — POLYETHYLENE GLYCOL 3350 17 G PO PACK
17.0000 g | PACK | Freq: Every day | ORAL | Status: DC
  Administered 2016-08-30 – 2016-08-31 (×2): 17 g via ORAL
  Filled 2016-08-30 (×2): qty 1

## 2016-08-30 MED ORDER — SENNOSIDES-DOCUSATE SODIUM 8.6-50 MG PO TABS
1.0000 | ORAL_TABLET | Freq: Every day | ORAL | Status: DC
  Administered 2016-08-31: 1 via ORAL
  Filled 2016-08-30: qty 1

## 2016-08-30 MED ORDER — FLEET ENEMA 7-19 GM/118ML RE ENEM
1.0000 | ENEMA | Freq: Once | RECTAL | Status: AC
Start: 2016-08-30 — End: 2016-08-30
  Administered 2016-08-30: 1 via RECTAL
  Filled 2016-08-30: qty 1

## 2016-08-30 MED ORDER — AMOXICILLIN-POT CLAVULANATE 500-125 MG PO TABS
500.0000 mg | ORAL_TABLET | Freq: Two times a day (BID) | ORAL | Status: DC
  Administered 2016-08-31 (×2): 500 mg via ORAL
  Filled 2016-08-30 (×3): qty 1

## 2016-08-30 NOTE — Progress Notes (Signed)
Katherine Miranda OEU:[id number] DOB: 08-17-1920 DOA: 08/27/2016 PCP: [name], MD  Brief narrative: 56 ? History of recurrent urinary infections HTN Chronic atrial fibrillation with chads score= 4-5 on Elliquis hypothyroid Anemia Fe def Dysphagia-previously on dysphagia 2 diet Advanced dementia Prior history of bradycardia and hypothermia secondary to the blockade  Admitted 08/28/16 with concerns for subacute decline and found to have aspiration pneumonia based on history. White count was 13.9, BUN/creatinine elevated at 42/1.33 >baseline 16/0 0.8  Started on ceftriaxone and azithromycin and speech therapy has been consulted  Past medical history-As per Problem list Chart reviewed as below-   Consultants:    Procedures:    Antibiotics:  Ceftriaxone 3/30-3/31  Azithromycin 3/30-3/31  Unasyn 3/31-4/1  Augmentin 4/1   Subjective   Awake No issue but didn't work well with therapy Not req Oxygen No new issue--small stool today   Objective    Interim History:   Telemetry:    Objective: Vitals:   08/29/16 0556 08/29/16 1300 08/29/16 1453 08/29/16 2154  BP: 138/84  (!) 167/61 (!) 174/88  Pulse: 74  (!) 55 66  Resp: 16  16 18   Temp: 98 F (36.7 C)  97.4 F (36.3 C) 97.9 F (36.6 C)  TempSrc: Oral  Oral Oral  SpO2: 100% 96% 97% 96%  Weight:      Height:        Intake/Output Summary (Last 24 hours) at 08/30/16 0827 Last data filed at 08/30/16 0700  Gross per 24 hour  Intake             2160 ml  Output                0 ml  Net             2160 ml    Exam:  General: eomi ncat, frail, no ict Cardiovascular: s1 s2 no m/r/g Respiratory: clear no wheeze Abdomen: soft nt nd no rebound Skin no le edema Neuro-intact  Data Reviewed: Basic Metabolic Panel:  Recent Labs Lab 08/27/16 2312 08/28/16 0500  NA 131* 134*  K 4.5 4.3  CL 95* 101  CO2 27 26  GLUCOSE 102* 90  BUN 42* 31*  CREATININE 1.33* 1.10*  CALCIUM 9.2 8.8*   Liver  Function Tests:  Recent Labs Lab 08/27/16 2312  AST 26  ALT 30  ALKPHOS 107  BILITOT 0.8  PROT 7.5  ALBUMIN 3.6   No results for input(s): LIPASE, AMYLASE in the last 168 hours. No results for input(s): AMMONIA in the last 168 hours. CBC:  Recent Labs Lab 08/27/16 2312 08/28/16 0500  WBC 13.9* 10.2  NEUTROABS 11.7*  --   HGB 11.1* 13.0  HCT 31.7* 37.4  MCV 85.7 85.8  PLT 269 203   Cardiac Enzymes: No results for input(s): CKTOTAL, CKMB, CKMBINDEX, TROPONINI in the last 168 hours. BNP: Invalid input(s): POCBNP CBG: No results for input(s): GLUCAP in the last 168 hours.  Recent Results (from the past 240 hour(s))  Blood Culture (routine x 2)     Status: None (Preliminary result)   Collection Time: 08/27/16 11:12 PM  Result Value Ref Range Status   Specimen Description BLOOD LEFT FOREARM  Final   Special Requests IN PEDIATRIC BOTTLE 3 CC  Final   Culture   Final    NO GROWTH 1 DAY Performed at [facility], [address] [place], [place] [address]    Report Status PENDING  Incomplete  Blood Culture (routine x 2)  Status: None (Preliminary result)   Collection Time: 08/27/16 11:17 PM  Result Value Ref Range Status   Specimen Description BLOOD LEFT ANTECUBITAL  Final   Special Requests BOTTLES DRAWN AEROBIC AND ANAEROBIC 5 CC  Final   Culture   Final    NO GROWTH 1 DAY Performed at [facility], [place] [address], [place], [place] [address]    Report Status PENDING  Incomplete  Urine culture     Status: Abnormal   Collection Time: 08/28/16  1:21 AM  Result Value Ref Range Status   Specimen Description URINE, CLEAN CATCH  Final   Special Requests NONE  Final   Culture MULTIPLE SPECIES PRESENT, SUGGEST RECOLLECTION (A)  Final   Report Status 08/29/2016 FINAL  Final  MRSA PCR Screening     Status: None   Collection Time: 08/28/16  4:36 AM  Result Value Ref Range Status   MRSA by PCR NEGATIVE NEGATIVE Final    Comment:        The GeneXpert MRSA  Assay (FDA approved for NASAL specimens only), is one component of a comprehensive MRSA colonization surveillance program. It is not intended to diagnose MRSA infection nor to guide or monitor treatment for MRSA infections.      Studies:              All Imaging reviewed and is as per above notation   Scheduled Meds: . acidophilus  1 capsule Oral Q breakfast  . ampicillin-sulbactam (UNASYN) IV  1.5 g Intravenous BID  . apixaban  2.5 mg Oral BID  . donepezil  10 mg Oral QHS  . ferrous sulfate  325 mg Oral Q1200  . levothyroxine  37.5 mcg Oral QAC breakfast  . mouth rinse  15 mL Mouth Rinse BID  . memantine  10 mg Oral QHS   Continuous Infusions: . sodium chloride 50 mL/hr at 08/30/16 0359     Assessment/Plan:   Aspiration pneumonia without sepsis physiology-switch antibiotics from ceftriaxone and azithromycin to Unasyn-->Augmentin 4/1. Speech therapy rec's downgrade from dys II--> Dys III. Wbc imporved 13-->10. ? goals of care at some point with PCP   Acute kidney injury-Baseline creatinine 0.8 on admission 1.5--->1.1 on follow up.  Saline lock IV 4/2 am  Paroxysmal atrial fibrillation on Elliquis-continue Elliquis 2.5 twice a day. Does not appear to be on rate controlling agent secondary to severe bradycardia in the past.  Chronic urinary tract infections-previously on trimethoprim 100 every morning for suppressive therapy. That is on hold now given aspiration abx.  As an outpatient resume cranberry by mouth.  Resume suppressive Trimethoprim as OP  Dementia moderate to severe-continue Namenda 10 daily at bedtime, Aricept 10 daily p.m.-will need an outpatient MMSE and will also need to have goals of care addressed given multiple recurrent admissions over the past year  Anemia likely iron deficiency/nutritional deficiency-continue by mouth iron 325 daily  Hypertension continue amlodipine 10 at bedtime  Continue Synthroid 37.5 g daily-outpatient TSH needed  VIt d def-holding  calcium bid and vit d  Constipation-Miralax and sennakot--add fleet prn if no effect  lovenox D/w daugther Tele ? d/cSNF tomorrow depending on resolution   Verneita Griffes, MD  [facility] Pager [phone] 08/30/2016, 8:27 AM    LOS: 2 days

## 2016-08-30 NOTE — Progress Notes (Signed)
qPhysical Therapy Treatment Patient Details Name: Katherine Miranda MRN: [id number] DOB: 1921/03/16 Today's Date: 08/30/2016    History of Present Illness 83 yo female admitted with pna. Hx of dementia, Afib, HRN, recurrent UTI, arthritis.     PT Comments    Daughter present during session and very helpful.   Assisted OOB to [facility] pt required + 2 assist.  Very weak and tremors plus B knees buckled.  Near fall to [facility] with partial turn completion.  Pt required repeat functional VC's to complete current task.  Pleasant but confused.  Attempted amb however very limited and required + 2 assist.  See ambulation details explained below. Daughter stated, "this is not her baseline".  Pt was able to move better and with one assist.  Daughter discussed the need for ST Rehab.  Agree pt would benefit.  Will consult LPT who is here today.   Follow Up Recommendations  SNF     Equipment Recommendations       Recommendations for Other Services       Precautions / Restrictions Precautions Precautions: Fall Restrictions Weight Bearing Restrictions: No    Mobility  Bed Mobility Overal bed mobility: Needs Assistance Bed Mobility: Supine to Sit     Supine to sit: Mod assist     General bed mobility comments: increased time and repeat VC's to complete.  greatest difficulty scooting self to EOB.  Tremors/fatigue/weakness  Transfers Overall transfer level: Needs assistance Equipment used: None;Rolling walker (2 wheeled) Transfers: Sit to/from [facility] Sit to Stand: Max assist;+2 physical assistance;+2 safety/equipment Stand pivot transfers: Max assist;+2 physical assistance;+2 safety/equipment       General transfer comment: 75% VC's on proper hand placement to avoid pulling up on walker.  Partial/incomplete turn from bed to [facility] with B knees buckling(near fall).  Required + 2 assist for safety.    Ambulation/Gait Ambulation/Gait assistance: Mod assist;Max assist;+2  physical assistance;+2 safety/equipment Ambulation Distance (Feet): 7 Feet Assistive device: Rolling walker (2 wheeled) Gait Pattern/deviations: Step-to pattern;Trunk flexed;Narrow base of support;Ataxic Gait velocity: decreased   General Gait Details: very unsteady gait with B knees buckling and tremors (R LE>L LE).  Shaky.  Poor self correction to upright.  Limited distance due to weakness B LE.  Poor self use of walker due to cognition.  Daughter assisted bu following with recliner.  HIGH FALL RISK   Stairs            Wheelchair Mobility    Modified Rankin (Stroke Patients Only)       Balance                                            Cognition Arousal/Alertness: Awake/alert   Overall Cognitive Status: Impaired/Different from baseline (per daughter)                                 General Comments: required repeate functional VC'as to complete task.  Pleasantly confused.        Exercises      General Comments        Pertinent Vitals/Pain Pain Assessment: No/denies pain    Home Living                      Prior Function  PT Goals (current goals can now be found in the care plan section) Progress towards PT goals: Progressing toward goals    Frequency    Min 3X/week      PT Plan Discharge plan needs to be updated    Co-evaluation             End of Session Equipment Utilized During Treatment: Gait belt Activity Tolerance: Patient limited by fatigue Patient left: in chair;with chair alarm set;with call bell/phone within reach Nurse Communication: Mobility status PT Visit Diagnosis: Muscle weakness (generalized) (M62.81);Difficulty in walking, not elsewhere classified (R26.2)     Time: 1410-3013 PT Time Calculation (min) (ACUTE ONLY): 28 min  Charges:  $Gait Training: 8-22 mins $Therapeutic Activity: 8-22 mins                    G Codes:       Rica Koyanagi  PTA [facility]  Acute   Rehab Pager      [phone]

## 2016-08-30 NOTE — Progress Notes (Signed)
PHARMACY NOTE:  ANTIMICROBIAL RENAL DOSAGE ADJUSTMENT  Current antimicrobial regimen includes a mismatch between antimicrobial dosage and estimated renal function.  As per policy approved by the Pharmacy & Therapeutics and Medical Executive Committees, the antimicrobial dosage will be adjusted accordingly.  Current antimicrobial dosage:  Augmentin 875 mg bid  Indication: Aspiration PNA  Renal Function:  Estimated Creatinine Clearance: 28.8 mL/min (A) (by C-G formula based on SCr of 1.1 mg/dL (H)). []      On intermittent HD, scheduled: []      On CRRT    Antimicrobial dosage has been changed to:  500 mg bid (for CrCl < 30)  Additional comments:   Thank you for allowing pharmacy to be a part of this patient's care.  Dezire Turk A, [place] 08/30/2016 9:20 AM

## 2016-08-30 NOTE — Clinical Social Work Placement (Signed)
   CLINICAL SOCIAL WORK PLACEMENT  NOTE  Date:  08/30/2016  Patient Details  Name: Katherine Miranda MRN: [id number] Date of Birth: 1921/04/12  Clinical Social Work is seeking post-discharge placement for this patient at the [place] level of care (*CSW will initial, date and re-position this form in  chart as items are completed):  Yes   Patient/family provided with [place] Work Department's list of facilities offering this level of care within the geographic area requested by the patient (or if unable, by the patient's family).  Yes   Patient/family informed of their freedom to choose among providers that offer the needed level of care, that participate in Medicare, Medicaid or managed care program needed by the patient, have an available bed and are willing to accept the patient.  Yes   Patient/family informed of [facility]'s ownership interest in [facility] and [facility], as well as of the fact that they are under no obligation to receive care at these facilities.  PASRR submitted to EDS on       PASRR number received on       Existing PASRR number confirmed on 08/30/16     FL2 transmitted to all facilities in geographic area requested by pt/family on 08/30/16     FL2 transmitted to all facilities within larger geographic area on       Patient informed that his/her managed care company has contracts with or will negotiate with certain facilities, including the following:            Patient/family informed of bed offers received.  Patient chooses bed at       Physician recommends and patient chooses bed at      Patient to be transferred to   on  .  Patient to be transferred to facility by       Patient family notified on   of transfer.  Name of family member notified:        PHYSICIAN Please sign FL2     Additional Comment:    _______________________________________________ Lilly Cove, LCSW 08/30/2016, 12:12  PM

## 2016-08-30 NOTE — NC FL2 (Signed)
[place] LEVEL OF CARE SCREENING TOOL     IDENTIFICATION  Patient Name: Katherine Miranda Birthdate: [birth year] Sex: female Admission Date (Current Location): 08/27/2016  [facility] and [place] Number:  Herbalist and Address:  [facility],  [place] [address], [place]      Provider Number: [id number]  Attending Physician Name and Address:  Nita Sells, MD  Relative Name and Phone Number:       Current Level of Care: Hospital Recommended Level of Care: [place] Prior Approval Number:    Date Approved/Denied:   PASRR Number: [id number]  Discharge Plan: SNF    Current Diagnoses: Patient Active Problem List   Diagnosis Date Noted  . Community acquired pneumonia of right middle lobe of lung ([place]) 08/28/2016  . CKD (chronic kidney disease), stage III 08/28/2016  . Normocytic anemia 08/28/2016  . UTI (urinary tract infection) 09/19/2015  . Bilateral pneumonia 09/18/2015  . Delirium 09/18/2015  . Dysphagia 07/17/2015  . UTI (lower urinary tract infection) 07/15/2015  . Hypothyroidism (acquired)   . Symptomatic bradycardia 04/20/2015  . Essential hypertension 09/19/2013  . Atrial fibrillation, chronic ([place]) 01/03/2013  . Subdural hygroma 10/06/2012  . Fall 10/06/2012  . Dementia 10/06/2012  . Hyponatremia 10/06/2012  . Dyslipidemia 10/06/2012    Orientation RESPIRATION BLADDER Height & Weight     Self  Normal Incontinent Weight: 149 lb 14.6 oz (68 kg) Height:  5\' 5"  (165.1 cm)  BEHAVIORAL SYMPTOMS/MOOD NEUROLOGICAL BOWEL NUTRITION STATUS      Incontinent Diet (see dc summary)  Dysphagia 3 (Mech soft) solids;Thin liquid   Liquid Administration via: Cup;Straw   Medication Administration: Whole meds with puree   Supervision: Full supervision/cueing for compensatory strategies   Compensations: Slow rate;Small sips/bites (start meals with drinks, intermittent dry swallow)       Oral Care  Recommendations: Oral care BID   AMBULATORY STATUS COMMUNICATION OF NEEDS Skin   Extensive Assist Verbally Normal                       Personal Care Assistance Level of Assistance  Bathing, Feeding, Dressing Bathing Assistance: Limited assistance Feeding assistance: Limited assistance Dressing Assistance: Limited assistance     Functional Limitations Info  Sight, Hearing, Speech Sight Info: Impaired Hearing Info: Impaired Speech Info: Adequate    SPECIAL CARE FACTORS FREQUENCY  PT (By licensed PT), OT (By licensed OT), Speech therapy     PT Frequency: 5x OT Frequency: 5x     Speech Therapy Frequency: 2x      Contractures Contractures Info: Not present    Additional Factors Info  Code Status, Allergies Code Status Info: Full Code Allergies Info: NKA           Current Medications (08/30/2016):  This is the current hospital active medication list Current Facility-Administered Medications  Medication Dose Route Frequency Provider Last Rate Last Dose  . 0.9 %  sodium chloride infusion   Intravenous Continuous Nita Sells, MD      . acetaminophen (TYLENOL) tablet 650 mg  650 mg Oral Q6H PRN Edwin Dada, MD       Or  . acetaminophen (TYLENOL) suppository 650 mg  650 mg Rectal Q6H PRN Edwin Dada, MD      . acidophilus (RISAQUAD) capsule 1 capsule  1 capsule Oral Q breakfast Edwin Dada, MD   1 capsule at 08/30/16 0755  . amoxicillin-clavulanate (AUGMENTIN) 500-125 MG per tablet 500 mg  500 mg Oral Q12H Nita Sells, MD      . apixaban (ELIQUIS) tablet 2.5 mg  2.5 mg Oral BID Edwin Dada, MD   2.5 mg at 08/30/16 0942  . donepezil (ARICEPT) tablet 10 mg  10 mg Oral QHS Edwin Dada, MD   10 mg at 08/29/16 2150  . ferrous sulfate tablet 325 mg  325 mg Oral Q1200 Edwin Dada, MD   325 mg at 08/29/16 1344  . guaiFENesin (MUCINEX) 12 hr tablet 600 mg  600 mg Oral BID PRN Edwin Dada, MD    600 mg at 08/29/16 1344  . levothyroxine (SYNTHROID, LEVOTHROID) tablet 37.5 mcg  37.5 mcg Oral QAC breakfast Edwin Dada, MD   37.5 mcg at 08/30/16 0755  . MEDLINE mouth rinse  15 mL Mouth Rinse BID Edwin Dada, MD   15 mL at 08/29/16 2151  . memantine (NAMENDA) tablet 10 mg  10 mg Oral QHS Edwin Dada, MD   10 mg at 08/29/16 2150  . ondansetron (ZOFRAN) tablet 4 mg  4 mg Oral Q6H PRN Edwin Dada, MD       Or  . ondansetron (ZOFRAN) injection 4 mg  4 mg Intravenous Q6H PRN Edwin Dada, MD      . polyethylene glycol (MIRALAX / GLYCOLAX) packet 17 g  17 g Oral Daily Nita Sells, MD      . senna-docusate (Senokot-S) tablet 1 tablet  1 tablet Oral QHS Nita Sells, MD      . sodium phosphate (FLEET) 7-19 GM/118ML enema 1 enema  1 enema Rectal Once Nita Sells, MD         Discharge Medications: Please see discharge summary for a list of discharge medications.  Relevant Imaging Results:  Relevant Lab Results:   Additional Information SSN: [id number].  Lilly Cove, LCSW

## 2016-08-30 NOTE — Clinical Social Work Note (Signed)
Clinical Social Work Assessment  Patient Details  Name: Katherine Miranda MRN: [id number] Date of Birth: 1921/01/05  Date of referral:  08/30/16               Reason for consult:  Facility Placement, Discharge Planning, Family Concerns                Permission sought to share information with:  Case Manager, Customer service manager, Family Supports Permission granted to share information::  Yes, Verbal Permission Granted  Name::        Agency::  SNF search  Relationship::  Engineer, civil (consulting) Information:     Housing/Transportation Living arrangements for the past 2 months:  [place] of Information:  Medical Team, Case Manager, Adult Children Patient Interpreter Needed:  None Criminal Activity/Legal Involvement Pertinent to Current Situation/Hospitalization:  No - Comment as needed Significant Relationships:  Adult Children, Other Family Members, Community Support, Friend Lives with:  Self, Adult Children Do you feel safe going back to the place where you live?  No Need for family participation in patient care:  Yes (Comment)  Care giving concerns:  LCSW met with daughter Katherine Miranda at bedside. At baseline patient is confused and daughter reports she has declined and become very weak. Patient currently lives at home, with 24/7 caregivers (family and friends, no hired agency involved) Per notes patient was going to [place] home with home health, but Katherine Miranda reports recommendations has changed to SNF for ST rehab in effort to get her stronger. LCSW still awaiting note.  [facility] choice for SNF: [facility].   Social Worker assessment / plan:  Consult and assessment completed for disposition of needs and planning. Family is requesting SNF at this time per recommendations of MD and PT. Family reports she has been to [facility] in the past and the facility was positive, however the drive was so far that no one could visit her. Request [facility] due to location. This  would be for ST per daughter.  LCSW explained custodial vs SNF rehab.  Daughter feels patient can benefit and wanting SNF at dc. PT is to change their recommendation.  Daughter informed patient must participate and there must be some sort of progress in therapy otherwise insurance could decline and out of pocket placement will occur.  Daughter voices understanding. Family very supportive and engaged in helping patient improve.  Plan: SNF work up Follow up with bed offers.  Employment status:  Retired Forensic scientist:  [employer] PT Recommendations:  Research officer, trade union, [place] / Referral to community resources:  [place]  Patient/Family's Response to care:  Understanding and agreeable  Patient/Family's Understanding of and Emotional Response to Diagnosis, Current Treatment, and Prognosis:  Patient sleeping. Daughter voices concerns and understands plan and SNF work up.  Emotional Assessment Appearance:  Appears stated age Attitude/Demeanor/Rapport:  Other (sleeping in chair, reporting pain in stomach) Affect (typically observed):  Accepting, Adaptable Orientation:  Oriented to Self Alcohol / Substance use:  Not Applicable Psych involvement (Current and /or in the community):  No (Comment)  Discharge Needs  Concerns to be addressed:  Denies Needs/Concerns at this time Readmission within the last 30 days:  No Current discharge risk:  None Barriers to Discharge:  Family Issues, Continued Medical Work up   Lilly [place], LCSW 08/30/2016, 12:05 PM

## 2016-08-31 DIAGNOSIS — M6281 Muscle weakness (generalized): Secondary | ICD-10-CM | POA: Diagnosis not present

## 2016-08-31 DIAGNOSIS — J189 Pneumonia, unspecified organism: Secondary | ICD-10-CM | POA: Diagnosis not present

## 2016-08-31 DIAGNOSIS — H2513 Age-related nuclear cataract, bilateral: Secondary | ICD-10-CM | POA: Diagnosis not present

## 2016-08-31 DIAGNOSIS — G9341 Metabolic encephalopathy: Secondary | ICD-10-CM | POA: Diagnosis present

## 2016-08-31 DIAGNOSIS — J309 Allergic rhinitis, unspecified: Secondary | ICD-10-CM | POA: Diagnosis not present

## 2016-08-31 DIAGNOSIS — Z22322 Carrier or suspected carrier of Methicillin resistant Staphylococcus aureus: Secondary | ICD-10-CM | POA: Diagnosis not present

## 2016-08-31 DIAGNOSIS — R402441 Other coma, without documented Glasgow coma scale score, or with partial score reported, in the field [EMT or ambulance]: Secondary | ICD-10-CM | POA: Diagnosis not present

## 2016-08-31 DIAGNOSIS — J181 Lobar pneumonia, unspecified organism: Secondary | ICD-10-CM | POA: Diagnosis not present

## 2016-08-31 DIAGNOSIS — F05 Delirium due to known physiological condition: Secondary | ICD-10-CM | POA: Diagnosis present

## 2016-08-31 DIAGNOSIS — R319 Hematuria, unspecified: Secondary | ICD-10-CM | POA: Diagnosis not present

## 2016-08-31 DIAGNOSIS — F0390 Unspecified dementia without behavioral disturbance: Secondary | ICD-10-CM | POA: Diagnosis present

## 2016-08-31 DIAGNOSIS — K5900 Constipation, unspecified: Secondary | ICD-10-CM | POA: Diagnosis not present

## 2016-08-31 DIAGNOSIS — E871 Hypo-osmolality and hyponatremia: Secondary | ICD-10-CM | POA: Diagnosis not present

## 2016-08-31 DIAGNOSIS — I482 Chronic atrial fibrillation: Secondary | ICD-10-CM | POA: Diagnosis present

## 2016-08-31 DIAGNOSIS — N3001 Acute cystitis with hematuria: Secondary | ICD-10-CM | POA: Diagnosis present

## 2016-08-31 DIAGNOSIS — R1312 Dysphagia, oropharyngeal phase: Secondary | ICD-10-CM | POA: Diagnosis not present

## 2016-08-31 DIAGNOSIS — I517 Cardiomegaly: Secondary | ICD-10-CM | POA: Diagnosis not present

## 2016-08-31 DIAGNOSIS — R001 Bradycardia, unspecified: Secondary | ICD-10-CM | POA: Diagnosis not present

## 2016-08-31 DIAGNOSIS — R278 Other lack of coordination: Secondary | ICD-10-CM | POA: Diagnosis not present

## 2016-08-31 DIAGNOSIS — I10 Essential (primary) hypertension: Secondary | ICD-10-CM | POA: Diagnosis not present

## 2016-08-31 DIAGNOSIS — I129 Hypertensive chronic kidney disease with stage 1 through stage 4 chronic kidney disease, or unspecified chronic kidney disease: Secondary | ICD-10-CM | POA: Diagnosis not present

## 2016-08-31 DIAGNOSIS — R609 Edema, unspecified: Secondary | ICD-10-CM | POA: Diagnosis not present

## 2016-08-31 DIAGNOSIS — H401132 Primary open-angle glaucoma, bilateral, moderate stage: Secondary | ICD-10-CM | POA: Diagnosis not present

## 2016-08-31 DIAGNOSIS — J989 Respiratory disorder, unspecified: Secondary | ICD-10-CM | POA: Diagnosis not present

## 2016-08-31 DIAGNOSIS — E0590 Thyrotoxicosis, unspecified without thyrotoxic crisis or storm: Secondary | ICD-10-CM | POA: Diagnosis not present

## 2016-08-31 DIAGNOSIS — Z8249 Family history of ischemic heart disease and other diseases of the circulatory system: Secondary | ICD-10-CM | POA: Diagnosis not present

## 2016-08-31 DIAGNOSIS — R4182 Altered mental status, unspecified: Secondary | ICD-10-CM | POA: Diagnosis not present

## 2016-08-31 DIAGNOSIS — Z66 Do not resuscitate: Secondary | ICD-10-CM | POA: Diagnosis present

## 2016-08-31 DIAGNOSIS — N183 Chronic kidney disease, stage 3 (moderate): Secondary | ICD-10-CM | POA: Diagnosis not present

## 2016-08-31 DIAGNOSIS — E039 Hypothyroidism, unspecified: Secondary | ICD-10-CM | POA: Diagnosis present

## 2016-08-31 DIAGNOSIS — E785 Hyperlipidemia, unspecified: Secondary | ICD-10-CM | POA: Diagnosis present

## 2016-08-31 DIAGNOSIS — M1990 Unspecified osteoarthritis, unspecified site: Secondary | ICD-10-CM | POA: Diagnosis not present

## 2016-08-31 DIAGNOSIS — E559 Vitamin D deficiency, unspecified: Secondary | ICD-10-CM | POA: Diagnosis not present

## 2016-08-31 DIAGNOSIS — M810 Age-related osteoporosis without current pathological fracture: Secondary | ICD-10-CM | POA: Diagnosis not present

## 2016-08-31 DIAGNOSIS — Z993 Dependence on wheelchair: Secondary | ICD-10-CM | POA: Diagnosis not present

## 2016-08-31 DIAGNOSIS — N390 Urinary tract infection, site not specified: Secondary | ICD-10-CM | POA: Diagnosis not present

## 2016-08-31 DIAGNOSIS — Z7401 Bed confinement status: Secondary | ICD-10-CM | POA: Diagnosis not present

## 2016-08-31 DIAGNOSIS — R05 Cough: Secondary | ICD-10-CM | POA: Diagnosis not present

## 2016-08-31 DIAGNOSIS — Z79899 Other long term (current) drug therapy: Secondary | ICD-10-CM | POA: Diagnosis not present

## 2016-08-31 DIAGNOSIS — Z7901 Long term (current) use of anticoagulants: Secondary | ICD-10-CM | POA: Diagnosis not present

## 2016-08-31 DIAGNOSIS — R262 Difficulty in walking, not elsewhere classified: Secondary | ICD-10-CM | POA: Diagnosis not present

## 2016-08-31 DIAGNOSIS — R4020 Unspecified coma: Secondary | ICD-10-CM | POA: Diagnosis not present

## 2016-08-31 DIAGNOSIS — D649 Anemia, unspecified: Secondary | ICD-10-CM | POA: Diagnosis not present

## 2016-08-31 DIAGNOSIS — G9340 Encephalopathy, unspecified: Secondary | ICD-10-CM | POA: Diagnosis not present

## 2016-08-31 LAB — BASIC METABOLIC PANEL
Anion gap: 7 (ref 5–15)
BUN: 22 mg/dL — ABNORMAL HIGH (ref 6–20)
CO2: 27 mmol/L (ref 22–32)
CREATININE: 0.7 mg/dL (ref 0.44–1.00)
Calcium: 8.6 mg/dL — ABNORMAL LOW (ref 8.9–10.3)
Chloride: 101 mmol/L (ref 101–111)
GLUCOSE: 100 mg/dL — AB (ref 65–99)
Potassium: 4.5 mmol/L (ref 3.5–5.1)
Sodium: 135 mmol/L (ref 135–145)

## 2016-08-31 LAB — CBC WITH DIFFERENTIAL/PLATELET
BASOS ABS: 0.1 10*3/uL (ref 0.0–0.1)
Basophils Relative: 1 %
EOS ABS: 0.1 10*3/uL (ref 0.0–0.7)
Eosinophils Relative: 2 %
HEMATOCRIT: 36.9 % (ref 36.0–46.0)
Hemoglobin: 12.7 g/dL (ref 12.0–15.0)
LYMPHS ABS: 1.1 10*3/uL (ref 0.7–4.0)
Lymphocytes Relative: 16 %
MCH: 30 pg (ref 26.0–34.0)
MCHC: 34.4 g/dL (ref 30.0–36.0)
MCV: 87.2 fL (ref 78.0–100.0)
MONO ABS: 0.8 10*3/uL (ref 0.1–1.0)
MONOS PCT: 12 %
NEUTROS ABS: 4.9 10*3/uL (ref 1.7–7.7)
Neutrophils Relative %: 69 %
PLATELETS: 242 10*3/uL (ref 150–400)
RBC: 4.23 MIL/uL (ref 3.87–5.11)
RDW: 15 % (ref 11.5–15.5)
WBC: 7 10*3/uL (ref 4.0–10.5)

## 2016-08-31 MED ORDER — TRIMETHOPRIM 100 MG PO TABS: 100.0000 mg | ORAL_TABLET | Freq: Every day | ORAL | Status: DC

## 2016-08-31 MED ORDER — AMOXICILLIN-POT CLAVULANATE 500-125 MG PO TABS: 500.0000 mg | ORAL_TABLET | Freq: Two times a day (BID) | ORAL | 0 refills | Status: AC

## 2016-08-31 MED ORDER — LEVOTHYROXINE SODIUM 25 MCG PO TABS: 37.5000 ug | ORAL_TABLET | Freq: Every day | ORAL | Status: DC

## 2016-08-31 NOTE — Discharge Summary (Addendum)
Physician Discharge Summary  Katherine Miranda:[id number] DOB: Jun 04, 1920 DOA: 08/27/2016  PCP: [name], MD  Admit date: 08/27/2016 Discharge date: 08/31/2016  Time spent: 45 minutes  Recommendations for Outpatient Follow-up:  1. Needs 2 more days Augmentin for aspiration  2. Resume on 4.5.17 suppressive therapy with Trimethoprim--this is a long term daily med that she will have adjusted by Dr. Matilde Sprang of urology on routine Urology follow up 3. Needs cbc + bmet as OP 4. Consider bowel regimen as OP at SNF 5. Needs tsh as op  Discharge Diagnoses:  Active Problems:   Dementia   Hyponatremia   Atrial fibrillation, chronic (HCC)   Essential hypertension   Hypothyroidism (acquired)   Community acquired pneumonia of right middle lobe of lung ([place])   CKD (chronic kidney disease), stage III   Normocytic anemia   Discharge Condition: fair  Diet recommendation: dysphagia III  Filed Weights   08/27/16 2335 08/28/16 0245  Weight: 68 kg (150 lb) 68 kg (149 lb 14.6 oz)    History of present illness:  83 ? History of recurrent urinary infections HTN Chronic atrial fibrillation with chads score= 4-5 on Elliquis hypothyroid Anemia Fe def Dysphagia-previously on dysphagia 2 diet Advanced dementia Prior history of bradycardia and hypothermia secondary to the blockade  Admitted 08/28/16 with concerns for subacute decline and found to have aspiration pneumonia based on history. White count was 13.9, BUN/creatinine elevated at 42/1.33 >baseline 16/0 0.8  Started on ceftriaxone and azithromycin and speech therapy has been consulted   Hospital Course:                Aspiration pneumonia without sepsis physiology-switch antibiotics from ceftriaxone and azithromycin to Unasyn-->Augmentin 4/1. Speech therapy rec's downgrade from dys II--> Dys III. Wbc imporved 13-->10. ? goals of care at some point with PCP              Acute kidney injury-Baseline creatinine 0.8 on admission  1.5--->1.1 on follow up.  Saline lock IV 4/2 am             Paroxysmal atrial fibrillation on Elliquis-continue Elliquis 2.5 twice a day. Does not appear to be on rate controlling agent secondary to severe bradycardia in the past.             Chronic urinary tract infections-previously on trimethoprim 100 every morning for suppressive therapy. That is on hold now given aspiration abx--resume on 09/03/16  As an outpatient resume cranberry by mouth.               Dementia moderate to severe-continue Namenda 10 daily at bedtime, Aricept 10 daily p.m.-will need an outpatient MMSE and will also need to have goals of care addressed given multiple recurrent admissions over the past year             Anemia likely iron deficiency/nutritional deficiency-continue by mouth iron 325 daily             Hypertension continue amlodipine 10 at bedtime             Continue Synthroid 37.5 g daily-outpatient TSH needed             VIt d def-holding calcium bid and vit d             Constipation-Miralax and sennakot used in hopsital--add fleet prn if no effect at SNF     Discharge Exam: Vitals:   08/30/16 2320 08/31/16 0443  BP: (!) 172/70 (!) 173/98  Pulse: 81 83  Resp: 16 16  Temp: 97.4 F (36.3 C) 97.5 F (36.4 C)    General: alert pleasant but confused which is [place] Cardiovascular: chest clear to ausc, s1 s 2 no m/r/g  Discharge Instructions   Discharge Instructions    Diet - low sodium heart healthy    Complete by:  As directed    Increase activity slowly    Complete by:  As directed      Current Discharge Medication List    START taking these medications   Details  amoxicillin-clavulanate (AUGMENTIN) 500-125 MG tablet Take 1 tablet (500 mg total) by mouth every 12 (twelve) hours. Qty: 4 tablet, Refills: 0      CONTINUE these medications which have CHANGED   Details  levothyroxine (SYNTHROID, LEVOTHROID) 25 MCG tablet Take 1.5 tablets (37.5 mcg total) by mouth daily before breakfast.     trimethoprim (TRIMPEX) 100 MG tablet Take 1 tablet (100 mg total) by mouth daily with breakfast.      CONTINUE these medications which have NOT CHANGED   Details  alendronate (FOSAMAX) 70 MG tablet Take 70 mg by mouth every Tuesday. Take with a full glass of water on an empty stomach.    amLODipine (NORVASC) 10 MG tablet Take 10 mg by mouth at bedtime.    Ascorbic Acid (VITAMIN C) 1000 MG tablet Take 1,000 mg by mouth 2 (two) times daily.     Bearberry, Uva-Ursi, (UVA URSI PO) Take 1 tablet by mouth 2 (two) times daily.     CALCIUM PO Take 1 tablet by mouth 2 (two) times daily.     Cholecalciferol (VITAMIN D) 2000 UNITS tablet Take 2,000 Units by mouth 2 (two) times daily.    Coenzyme Q10 (COQ10) 200 MG CAPS Take 200 mg by mouth daily with breakfast.     CRANBERRY PO Take 1 tablet by mouth daily with breakfast.     donepezil (ARICEPT) 10 MG tablet Take 10 mg by mouth at bedtime.    ELIQUIS 2.5 MG TABS tablet TAKE 1 TABLET TWICE A DAY Qty: 180 tablet, Refills: 3    guaiFENesin (MUCINEX) 600 MG 12 hr tablet Take 600 mg by mouth 2 (two) times daily as needed for cough or to loosen phlegm.    IRON PO Take 1 tablet by mouth daily at 12 noon.    loratadine (CLARITIN) 10 MG tablet Take 10 mg by mouth at bedtime.    memantine (NAMENDA) 10 MG tablet Take 10 mg by mouth at bedtime.     Omega-3 Fatty Acids (FISH OIL) 1000 MG CAPS Take 1,000 mg by mouth daily with breakfast.       STOP taking these medications     Probiotic Product (PROBIOTIC PO)      Travoprost, BAK Free, (TRAVATAN) 0.004 % SOLN ophthalmic solution        No Known Allergies Contact information for after-discharge care    Destination    [facility] .   Specialty:  [place] information: [address] [place] [phone]               The results of significant diagnostics from this hospitalization (including imaging,  microbiology, ancillary and laboratory) are listed below for reference.    Significant Diagnostic Studies: Dg Chest 2 View  Result Date: 08/27/2016 CLINICAL DATA:  Cough for 1 week, urinary tract infection EXAM: CHEST  2 VIEW COMPARISON:  CXR 06/29/2016 and 10/15/2015 FINDINGS: The heart size and mediastinal  contours are within normal limits. There is aortic atherosclerosis at the arch. On the lateral projection, there is a curvilinear opacity just anterior to the lower thoracic spine slightly more prominent than on prior exams and could represent a vascular summation of the pulmonary arteries. Superimposed pneumonia would not be difficult to entirely exclude about the right hilum. No effusion nor overt pulmonary edema. IMPRESSION: Confluent opacity best seen on the lateral view about the hilum, likely on the right may represent a vascular summation of pulmonary vessels. Superimposed pneumonia cannot be entirely excluded. Electronically Signed   By: Ashley Royalty M.D.   On: 08/27/2016 23:01   Dg Swallowing Func-speech Pathology  Result Date: 08/29/2016 Objective Swallowing Evaluation: Type of Study: MBS-Modified Barium Swallow Study Patient Details Name: KATILYNN SINKLER MRN: [id number] Date of Birth: 02/16/1921 Today's Date: 08/29/2016 Time: SLP Start Time (ACUTE ONLY): 1515-SLP Stop Time (ACUTE ONLY): 1545 SLP Time Calculation (min) (ACUTE ONLY): 30 min Past Medical History: Past Medical History: Diagnosis Date . A-fib ([place])  . Arthritis  . Dementia  . Hypertension  Past Surgical History: Past Surgical History: Procedure Laterality Date . BACK SURGERY   . CARDIAC CATHETERIZATION N/A 04/20/2015  Procedure: Temporary Pacemaker;  Surgeon: Lorretta Harp, MD;  Location: [facility];  Service: Cardiovascular;  Laterality: N/A; . KNEE ARTHROSCOPY   HPI: Pt is a 83 year old with dementia, admitted with cough, progressive weakness, subacute decline over the last several months, no wheelchair bound.  CXR 2  view shows question of vascular summation of pulmonary vessels, pna cannot be excluded.  Pt has a history of dysphagia 1 year ago with esophageal involvement. Pts last MBS on 05/23/15 recommended a regular diet and thin liquids with multiple swallows.  Subjective: pt awake in chair, daughter present Assessment / Plan / Recommendation CHL IP CLINICAL IMPRESSIONS 08/29/2016 Clinical Impression Pt presents with mild oropharyngeal dysphagia without aspiration/penetration of any consistency tested.  Mildly delayed oral transiting noted.  Pharyngeal swallow was strong with puree/solids but with mild residuals of liquids.  Cued dry swallow helpful to decrease mild liquid residuals.  Delayed pharyngeal swallow noted with  cracker bolus spilling over epiglottis toward pyriform sinus region - with SlP cueing pt to swallow due to aspiration risk.  Using live video, educated pt and daughter to findings/recommendations.  Recommend soft/thin diet with precautions to mitigate aspiration. Of note, pt did cough during MBS and no aspiration was observed at that time.   SLP Visit Diagnosis Dysphagia, oropharyngeal phase (R13.12) Attention and concentration deficit following -- Frontal lobe and executive function deficit following -- Impact on safety and function Mild aspiration risk   CHL IP TREATMENT RECOMMENDATION 08/29/2016 Treatment Recommendations Therapy as outlined in treatment plan below   Prognosis 08/29/2016 Prognosis for Safe Diet Advancement Good Barriers to Reach Goals -- Barriers/Prognosis Comment -- CHL IP DIET RECOMMENDATION 08/29/2016 SLP Diet Recommendations Dysphagia 3 (Mech soft) solids;Thin liquid Liquid Administration via Cup;Straw Medication Administration Whole meds with puree Compensations Slow rate;Small sips/bites Postural Changes --   CHL IP OTHER RECOMMENDATIONS 08/29/2016 Recommended Consults -- Oral Care Recommendations Oral care BID Other Recommendations Clarify dietary restrictions   CHL IP FOLLOW UP  RECOMMENDATIONS 08/29/2016 Follow up Recommendations 24 hour supervision/assistance   CHL IP FREQUENCY AND DURATION 08/29/2016 Speech Therapy Frequency (ACUTE ONLY) min 2x/week Treatment Duration 2 weeks      CHL IP ORAL PHASE 08/29/2016 Oral Phase WFL Oral - Pudding Teaspoon -- Oral - Pudding Cup -- Oral - Honey Teaspoon --  Oral - Honey Cup -- Oral - Nectar Teaspoon -- Oral - Nectar Cup WFL Oral - Nectar Straw NT Oral - Thin Teaspoon WFL Oral - Thin Cup WFL Oral - Thin Straw WFL Oral - Puree Delayed oral transit Oral - Mech Soft -- Oral - Regular Delayed oral transit;Premature spillage Oral - Multi-Consistency -- Oral - Pill WFL;Premature spillage Oral Phase - Comment --  CHL IP PHARYNGEAL PHASE 08/29/2016 Pharyngeal Phase Impaired Pharyngeal- Pudding Teaspoon -- Pharyngeal -- Pharyngeal- Pudding Cup -- Pharyngeal -- Pharyngeal- Honey Teaspoon -- Pharyngeal -- Pharyngeal- Honey Cup -- Pharyngeal -- Pharyngeal- Nectar Teaspoon -- Pharyngeal -- Pharyngeal- Nectar Cup WFL Pharyngeal -- Pharyngeal- Nectar Straw -- Pharyngeal -- Pharyngeal- Thin Teaspoon WFL;Pharyngeal residue - valleculae;Pharyngeal residue - pyriform Pharyngeal -- Pharyngeal- Thin Cup [facility];Pharyngeal residue - valleculae;Pharyngeal residue - pyriform Pharyngeal -- Pharyngeal- Thin Straw WFL Pharyngeal -- Pharyngeal- Puree -- Pharyngeal -- Pharyngeal- Mechanical Soft -- Pharyngeal -- Pharyngeal- Regular Delayed swallow initiation-vallecula Pharyngeal -- Pharyngeal- Multi-consistency -- Pharyngeal -- Pharyngeal- Pill WFL Pharyngeal -- Pharyngeal Comment --  CHL IP CERVICAL ESOPHAGEAL PHASE 08/29/2016 Cervical Esophageal Phase Impaired Pudding Teaspoon -- Pudding Cup -- Honey Teaspoon -- Honey Cup -- Nectar Teaspoon -- Nectar Cup -- Nectar Straw -- Thin Teaspoon -- Thin Cup -- Thin Straw -- Puree -- Mechanical Soft -- Regular -- Multi-consistency -- Pill -- Cervical Esophageal Comment barium tablet taken with pudding appeared to lodge at mid=esophagus without  pt awareness, liquid boluses faciliated clearance, upon completion of MBS, pt appeared with tracing of barium in esophagus Claudie Fisherman, DISH [facility] SLP [phone]               Microbiology: Recent Results (from the past 240 hour(s))  Blood Culture (routine x 2)     Status: None (Preliminary result)   Collection Time: 08/27/16 11:12 PM  Result Value Ref Range Status   Specimen Description BLOOD LEFT FOREARM  Final   Special Requests IN PEDIATRIC BOTTLE 3 CC  Final   Culture   Final    NO GROWTH 2 DAYS Performed at [facility], [address], [place], [place] [address]    Report Status PENDING  Incomplete  Blood Culture (routine x 2)     Status: None (Preliminary result)   Collection Time: 08/27/16 11:17 PM  Result Value Ref Range Status   Specimen Description BLOOD LEFT ANTECUBITAL  Final   Special Requests BOTTLES DRAWN AEROBIC AND ANAEROBIC 5 CC  Final   Culture   Final    NO GROWTH 2 DAYS Performed at [facility], [place] [address], [place],  [address]    Report Status PENDING  Incomplete  Urine culture     Status: Abnormal   Collection Time: 08/28/16  1:21 AM  Result Value Ref Range Status   Specimen Description URINE, CLEAN CATCH  Final   Special Requests NONE  Final   Culture MULTIPLE SPECIES PRESENT, SUGGEST RECOLLECTION (A)  Final   Report Status 08/29/2016 FINAL  Final  MRSA PCR Screening     Status: None   Collection Time: 08/28/16  4:36 AM  Result Value Ref Range Status   MRSA by PCR NEGATIVE NEGATIVE Final    Comment:        The GeneXpert MRSA Assay (FDA approved for NASAL specimens only), is one component of a comprehensive MRSA colonization surveillance program. It is not intended to diagnose MRSA infection nor to guide or monitor treatment for MRSA infections.  Labs: Basic Metabolic Panel:  Recent Labs Lab 08/27/16 2312 08/28/16 0500 08/31/16 0401  NA 131* 134* 135  K 4.5 4.3 4.5  CL 95* 101 101  CO2 27  26 27   GLUCOSE 102* 90 100*  BUN 42* 31* 22*  CREATININE 1.33* 1.10* 0.70  CALCIUM 9.2 8.8* 8.6*   Liver Function Tests:  Recent Labs Lab 08/27/16 2312  AST 26  ALT 30  ALKPHOS 107  BILITOT 0.8  PROT 7.5  ALBUMIN 3.6   No results for input(s): LIPASE, AMYLASE in the last 168 hours. No results for input(s): AMMONIA in the last 168 hours. CBC:  Recent Labs Lab 08/27/16 2312 08/28/16 0500 08/31/16 0401  WBC 13.9* 10.2 7.0  NEUTROABS 11.7*  --  4.9  HGB 11.1* 13.0 12.7  HCT 31.7* 37.4 36.9  MCV 85.7 85.8 87.2  PLT 269 203 242   Cardiac Enzymes: No results for input(s): CKTOTAL, CKMB, CKMBINDEX, TROPONINI in the last 168 hours. BNP: BNP (last 3 results) No results for input(s): BNP in the last 8760 hours.  ProBNP (last 3 results) No results for input(s): PROBNP in the last 8760 hours.  CBG: No results for input(s): GLUCAP in the last 168 hours.     SignedNita Sells MD   [facility] 08/31/2016, 12:27 PM

## 2016-08-31 NOTE — Progress Notes (Signed)
Updated d/c faxed.  [facility] called for transport.   Kathrin Greathouse, Latanya Presser, MSW Clinical Social Worker 5E and Psychiatric Service Line [phone] 08/31/2016  2:48 PM

## 2016-08-31 NOTE — Progress Notes (Signed)
CSW assisting with d/c planning. Pt / daughter Katherine Miranda [phone] ) have accepted SNF bed at [facility]. Bed is available today if pt is stable for d/c.   Werner Lean [phone]

## 2016-08-31 NOTE — Progress Notes (Signed)
qPhysical Therapy Treatment Patient Details Name: Katherine Miranda MRN: [id number] DOB: 1921/05/17 Today's Date: 08/31/2016    History of Present Illness 83 yo female admitted with pna. Hx of dementia, Afib, HRN, recurrent UTI, arthritis.     PT Comments    Pt continues to require +2 assist for mobility on today. No family present during session. Continue to recommend SNF.    Follow Up Recommendations  SNF     Equipment Recommendations  None recommended by PT    Recommendations for Other Services       Precautions / Restrictions Precautions Precautions: Fall Restrictions Weight Bearing Restrictions: No    Mobility  Bed Mobility Overal bed mobility: Needs Assistance Bed Mobility: Supine to Sit     Supine to sit: Min assist;HOB elevated     General bed mobility comments: assist for trunk. Mod multimodal cues for completion of task. Increased time.   Transfers Overall transfer level: Needs assistance Equipment used: Rolling walker (2 wheeled) Transfers: Sit to/from Stand Sit to Stand: Max assist Stand pivot transfers: Mod assist;+2 physical assistance;+2 safety/equipment       General transfer comment: Assist to rise, stabilize, control descent. Multimodal cueing for safe completion of task. Pt performs task better when armrests are available to use.  Mod assist +2 for stand pivot transfer, bed to bsc with RW  Ambulation/Gait Ambulation/Gait assistance: Mod assist;+2 physical assistance;+2 safety/equipment Ambulation Distance (Feet): 6 Feet Assistive device: Rolling walker (2 wheeled) Gait Pattern/deviations: Step-to pattern;Decreased stance time - right;Trunk flexed;Decreased step length - right     General Gait Details: Assist to stabilize pt and maneuver with RW. Mod multimodal cues for posture, distance from RW. Pt tends to maintain a flexed trunk and knee posture. She will correct this with cues but she does not maintain the correction. She remains at risk  for falls on today.    Stairs            Wheelchair Mobility    Modified Rankin (Stroke Patients Only)       Balance Overall balance assessment: Needs assistance;History of Falls           Standing balance-Leahy Scale: Poor Standing balance comment: high fall risk even with use of walker                            Cognition Arousal/Alertness: Awake/alert Behavior During Therapy: WFL for tasks assessed/performed Overall Cognitive Status: History of cognitive impairments - at baseline                                        Exercises      General Comments        Pertinent Vitals/Pain Pain Assessment: No/denies pain    Home Living                      Prior Function            PT Goals (current goals can now be found in the care plan section) Progress towards PT goals: Progressing toward goals (slowly)    Frequency    Min 3X/week      PT Plan Current plan remains appropriate    Co-evaluation             End of Session Equipment Utilized During Treatment: Gait belt Activity Tolerance: Patient tolerated treatment  well Patient left: in chair;with call bell/phone within reach;with chair alarm set   PT Visit Diagnosis: Muscle weakness (generalized) (M62.81);Difficulty in walking, not elsewhere classified (R26.2)     Time: 3435-6861 PT Time Calculation (min) (ACUTE ONLY): 20 min  Charges:  $Gait Training: 8-22 mins                    G Codes:          Katherine Miranda 08/31/2016, 12:00 PM

## 2016-09-01 NOTE — Clinical Social Work Placement (Signed)
   CLINICAL SOCIAL WORK PLACEMENT  NOTE  Date:  09/01/2016  Patient Details  Name: Katherine Miranda MRN: [id number] Date of Birth: 07-May-1921  Clinical Social Work is seeking post-discharge placement for this patient at the [place] level of care (*CSW will initial, date and re-position this form in  chart as items are completed):  Yes   Patient/family provided with [place] Work Department's list of facilities offering this level of care within the geographic area requested by the patient (or if unable, by the patient's family).  Yes   Patient/family informed of their freedom to choose among providers that offer the needed level of care, that participate in Medicare, Medicaid or managed care program needed by the patient, have an available bed and are willing to accept the patient.  Yes   Patient/family informed of [facility]'s ownership interest in [facility] and [facility], as well as of the fact that they are under no obligation to receive care at these facilities.  PASRR submitted to EDS on       PASRR number received on       Existing PASRR number confirmed on 08/30/16     FL2 transmitted to all facilities in geographic area requested by pt/family on 08/30/16     FL2 transmitted to all facilities within larger geographic area on       Patient informed that his/her managed care company has contracts with or will negotiate with certain facilities, including the following:        Yes   Patient/family informed of bed offers received.  Patient chooses bed at [facility]     Physician recommends and patient chooses bed at      Patient to be transferred to [facility] on 08/31/16.  Patient to be transferred to facility by [facility]     Patient family notified on 08/31/16 of transfer.  Name of family member notified:  Daughter     PHYSICIAN Please sign FL2     Additional Comment:     _______________________________________________ Luretha Rued, [place] 09/01/2016, 9:25 AM

## 2016-09-02 LAB — CULTURE, BLOOD (ROUTINE X 2)
CULTURE: NO GROWTH
Culture: NO GROWTH

## 2016-10-02 DIAGNOSIS — E039 Hypothyroidism, unspecified: Secondary | ICD-10-CM | POA: Diagnosis not present

## 2016-10-02 DIAGNOSIS — I10 Essential (primary) hypertension: Secondary | ICD-10-CM | POA: Diagnosis not present

## 2016-10-27 DIAGNOSIS — I482 Chronic atrial fibrillation: Secondary | ICD-10-CM | POA: Diagnosis not present

## 2016-10-27 DIAGNOSIS — E039 Hypothyroidism, unspecified: Secondary | ICD-10-CM | POA: Diagnosis not present

## 2016-10-27 DIAGNOSIS — F0390 Unspecified dementia without behavioral disturbance: Secondary | ICD-10-CM | POA: Diagnosis not present

## 2016-10-27 DIAGNOSIS — M810 Age-related osteoporosis without current pathological fracture: Secondary | ICD-10-CM | POA: Diagnosis not present

## 2016-12-08 DIAGNOSIS — Z7901 Long term (current) use of anticoagulants: Secondary | ICD-10-CM | POA: Diagnosis not present

## 2016-12-08 DIAGNOSIS — F0390 Unspecified dementia without behavioral disturbance: Secondary | ICD-10-CM | POA: Diagnosis not present

## 2016-12-08 DIAGNOSIS — H401132 Primary open-angle glaucoma, bilateral, moderate stage: Secondary | ICD-10-CM | POA: Diagnosis not present

## 2016-12-08 DIAGNOSIS — I482 Chronic atrial fibrillation: Secondary | ICD-10-CM | POA: Diagnosis not present

## 2016-12-22 DIAGNOSIS — R609 Edema, unspecified: Secondary | ICD-10-CM | POA: Diagnosis not present

## 2016-12-22 DIAGNOSIS — N390 Urinary tract infection, site not specified: Secondary | ICD-10-CM | POA: Diagnosis not present

## 2017-01-15 DIAGNOSIS — H2513 Age-related nuclear cataract, bilateral: Secondary | ICD-10-CM | POA: Diagnosis not present

## 2017-01-15 DIAGNOSIS — H401132 Primary open-angle glaucoma, bilateral, moderate stage: Secondary | ICD-10-CM | POA: Diagnosis not present

## 2017-02-04 DIAGNOSIS — F0390 Unspecified dementia without behavioral disturbance: Secondary | ICD-10-CM | POA: Diagnosis not present

## 2017-02-04 DIAGNOSIS — I482 Chronic atrial fibrillation: Secondary | ICD-10-CM | POA: Diagnosis not present

## 2017-02-04 DIAGNOSIS — R1312 Dysphagia, oropharyngeal phase: Secondary | ICD-10-CM | POA: Diagnosis not present

## 2017-02-04 DIAGNOSIS — I129 Hypertensive chronic kidney disease with stage 1 through stage 4 chronic kidney disease, or unspecified chronic kidney disease: Secondary | ICD-10-CM | POA: Diagnosis not present

## 2017-03-18 DIAGNOSIS — I482 Chronic atrial fibrillation: Secondary | ICD-10-CM | POA: Diagnosis not present

## 2017-03-18 DIAGNOSIS — F0390 Unspecified dementia without behavioral disturbance: Secondary | ICD-10-CM | POA: Diagnosis not present

## 2017-03-18 DIAGNOSIS — M810 Age-related osteoporosis without current pathological fracture: Secondary | ICD-10-CM | POA: Diagnosis not present

## 2017-03-18 DIAGNOSIS — E039 Hypothyroidism, unspecified: Secondary | ICD-10-CM | POA: Diagnosis not present

## 2017-04-01 DIAGNOSIS — H401132 Primary open-angle glaucoma, bilateral, moderate stage: Secondary | ICD-10-CM | POA: Diagnosis not present

## 2017-04-01 DIAGNOSIS — E785 Hyperlipidemia, unspecified: Secondary | ICD-10-CM | POA: Diagnosis present

## 2017-04-01 DIAGNOSIS — Z7901 Long term (current) use of anticoagulants: Secondary | ICD-10-CM | POA: Diagnosis not present

## 2017-04-01 DIAGNOSIS — G9340 Encephalopathy, unspecified: Secondary | ICD-10-CM | POA: Diagnosis not present

## 2017-04-01 DIAGNOSIS — N3001 Acute cystitis with hematuria: Secondary | ICD-10-CM | POA: Diagnosis present

## 2017-04-01 DIAGNOSIS — R402441 Other coma, without documented Glasgow coma scale score, or with partial score reported, in the field [EMT or ambulance]: Secondary | ICD-10-CM | POA: Diagnosis not present

## 2017-04-01 DIAGNOSIS — E039 Hypothyroidism, unspecified: Secondary | ICD-10-CM | POA: Diagnosis present

## 2017-04-01 DIAGNOSIS — Z8249 Family history of ischemic heart disease and other diseases of the circulatory system: Secondary | ICD-10-CM | POA: Diagnosis not present

## 2017-04-01 DIAGNOSIS — Z66 Do not resuscitate: Secondary | ICD-10-CM | POA: Diagnosis present

## 2017-04-01 DIAGNOSIS — Z993 Dependence on wheelchair: Secondary | ICD-10-CM | POA: Diagnosis not present

## 2017-04-01 DIAGNOSIS — N183 Chronic kidney disease, stage 3 (moderate): Secondary | ICD-10-CM | POA: Diagnosis not present

## 2017-04-01 DIAGNOSIS — G9341 Metabolic encephalopathy: Secondary | ICD-10-CM | POA: Diagnosis present

## 2017-04-01 DIAGNOSIS — N390 Urinary tract infection, site not specified: Secondary | ICD-10-CM | POA: Diagnosis not present

## 2017-04-01 DIAGNOSIS — F05 Delirium due to known physiological condition: Secondary | ICD-10-CM | POA: Diagnosis present

## 2017-04-01 DIAGNOSIS — Z79899 Other long term (current) drug therapy: Secondary | ICD-10-CM | POA: Diagnosis not present

## 2017-04-01 DIAGNOSIS — I517 Cardiomegaly: Secondary | ICD-10-CM | POA: Diagnosis not present

## 2017-04-01 DIAGNOSIS — R319 Hematuria, unspecified: Secondary | ICD-10-CM | POA: Diagnosis not present

## 2017-04-01 DIAGNOSIS — Z7401 Bed confinement status: Secondary | ICD-10-CM | POA: Diagnosis not present

## 2017-04-01 DIAGNOSIS — F0390 Unspecified dementia without behavioral disturbance: Secondary | ICD-10-CM | POA: Diagnosis present

## 2017-04-01 DIAGNOSIS — R001 Bradycardia, unspecified: Secondary | ICD-10-CM | POA: Diagnosis not present

## 2017-04-01 DIAGNOSIS — I482 Chronic atrial fibrillation: Secondary | ICD-10-CM | POA: Diagnosis present

## 2017-04-01 DIAGNOSIS — R4020 Unspecified coma: Secondary | ICD-10-CM | POA: Diagnosis not present

## 2017-04-01 DIAGNOSIS — R4182 Altered mental status, unspecified: Secondary | ICD-10-CM | POA: Diagnosis not present

## 2017-04-01 DIAGNOSIS — Z22322 Carrier or suspected carrier of Methicillin resistant Staphylococcus aureus: Secondary | ICD-10-CM | POA: Diagnosis not present

## 2017-04-16 DIAGNOSIS — F0390 Unspecified dementia without behavioral disturbance: Secondary | ICD-10-CM | POA: Diagnosis not present

## 2017-04-16 DIAGNOSIS — H401132 Primary open-angle glaucoma, bilateral, moderate stage: Secondary | ICD-10-CM | POA: Diagnosis not present

## 2017-04-16 DIAGNOSIS — N183 Chronic kidney disease, stage 3 (moderate): Secondary | ICD-10-CM | POA: Diagnosis not present

## 2017-04-20 ENCOUNTER — Other Ambulatory Visit: Payer: Self-pay

## 2017-04-20 ENCOUNTER — Encounter (HOSPITAL_COMMUNITY): Payer: Self-pay

## 2017-04-20 ENCOUNTER — Inpatient Hospital Stay (HOSPITAL_COMMUNITY)
Admission: EM | Admit: 2017-04-20 | Discharge: 2017-04-23 | DRG: 689 | Disposition: A | Discharge status: Skilled Nursing Facility | Payer: Medicare Other | Attending: Family Medicine | Admitting: Family Medicine

## 2017-04-20 ENCOUNTER — Emergency Department (HOSPITAL_COMMUNITY): Payer: Medicare Other

## 2017-04-20 DIAGNOSIS — E871 Hypo-osmolality and hyponatremia: Secondary | ICD-10-CM | POA: Diagnosis not present

## 2017-04-20 DIAGNOSIS — Z7901 Long term (current) use of anticoagulants: Secondary | ICD-10-CM | POA: Diagnosis not present

## 2017-04-20 DIAGNOSIS — R4182 Altered mental status, unspecified: Secondary | ICD-10-CM

## 2017-04-20 DIAGNOSIS — F0390 Unspecified dementia without behavioral disturbance: Secondary | ICD-10-CM | POA: Diagnosis present

## 2017-04-20 DIAGNOSIS — N390 Urinary tract infection, site not specified: Secondary | ICD-10-CM | POA: Diagnosis not present

## 2017-04-20 DIAGNOSIS — R1312 Dysphagia, oropharyngeal phase: Secondary | ICD-10-CM | POA: Diagnosis not present

## 2017-04-20 DIAGNOSIS — R001 Bradycardia, unspecified: Secondary | ICD-10-CM | POA: Diagnosis not present

## 2017-04-20 DIAGNOSIS — E039 Hypothyroidism, unspecified: Secondary | ICD-10-CM | POA: Diagnosis present

## 2017-04-20 DIAGNOSIS — R402441 Other coma, without documented Glasgow coma scale score, or with partial score reported, in the field [EMT or ambulance]: Secondary | ICD-10-CM | POA: Diagnosis not present

## 2017-04-20 DIAGNOSIS — Z22322 Carrier or suspected carrier of Methicillin resistant Staphylococcus aureus: Secondary | ICD-10-CM

## 2017-04-20 DIAGNOSIS — M6281 Muscle weakness (generalized): Secondary | ICD-10-CM | POA: Diagnosis not present

## 2017-04-20 DIAGNOSIS — E559 Vitamin D deficiency, unspecified: Secondary | ICD-10-CM | POA: Diagnosis not present

## 2017-04-20 DIAGNOSIS — I482 Chronic atrial fibrillation: Secondary | ICD-10-CM | POA: Diagnosis present

## 2017-04-20 DIAGNOSIS — R278 Other lack of coordination: Secondary | ICD-10-CM | POA: Diagnosis not present

## 2017-04-20 DIAGNOSIS — J309 Allergic rhinitis, unspecified: Secondary | ICD-10-CM | POA: Diagnosis not present

## 2017-04-20 DIAGNOSIS — Z66 Do not resuscitate: Secondary | ICD-10-CM | POA: Diagnosis present

## 2017-04-20 DIAGNOSIS — K5900 Constipation, unspecified: Secondary | ICD-10-CM | POA: Diagnosis not present

## 2017-04-20 DIAGNOSIS — N3001 Acute cystitis with hematuria: Secondary | ICD-10-CM | POA: Diagnosis present

## 2017-04-20 DIAGNOSIS — Z8249 Family history of ischemic heart disease and other diseases of the circulatory system: Secondary | ICD-10-CM

## 2017-04-20 DIAGNOSIS — Z79899 Other long term (current) drug therapy: Secondary | ICD-10-CM | POA: Diagnosis not present

## 2017-04-20 DIAGNOSIS — G9341 Metabolic encephalopathy: Secondary | ICD-10-CM | POA: Diagnosis present

## 2017-04-20 DIAGNOSIS — F05 Delirium due to known physiological condition: Secondary | ICD-10-CM | POA: Diagnosis present

## 2017-04-20 DIAGNOSIS — M810 Age-related osteoporosis without current pathological fracture: Secondary | ICD-10-CM | POA: Diagnosis not present

## 2017-04-20 DIAGNOSIS — Z7401 Bed confinement status: Secondary | ICD-10-CM

## 2017-04-20 DIAGNOSIS — G9340 Encephalopathy, unspecified: Secondary | ICD-10-CM | POA: Diagnosis present

## 2017-04-20 DIAGNOSIS — H401132 Primary open-angle glaucoma, bilateral, moderate stage: Secondary | ICD-10-CM | POA: Diagnosis not present

## 2017-04-20 DIAGNOSIS — E785 Hyperlipidemia, unspecified: Secondary | ICD-10-CM | POA: Diagnosis present

## 2017-04-20 DIAGNOSIS — N183 Chronic kidney disease, stage 3 (moderate): Secondary | ICD-10-CM | POA: Diagnosis not present

## 2017-04-20 DIAGNOSIS — Z993 Dependence on wheelchair: Secondary | ICD-10-CM | POA: Diagnosis not present

## 2017-04-20 DIAGNOSIS — M1990 Unspecified osteoarthritis, unspecified site: Secondary | ICD-10-CM | POA: Diagnosis not present

## 2017-04-20 DIAGNOSIS — R262 Difficulty in walking, not elsewhere classified: Secondary | ICD-10-CM | POA: Diagnosis not present

## 2017-04-20 DIAGNOSIS — I129 Hypertensive chronic kidney disease with stage 1 through stage 4 chronic kidney disease, or unspecified chronic kidney disease: Secondary | ICD-10-CM | POA: Diagnosis not present

## 2017-04-20 DIAGNOSIS — R4020 Unspecified coma: Secondary | ICD-10-CM | POA: Diagnosis not present

## 2017-04-20 DIAGNOSIS — R05 Cough: Secondary | ICD-10-CM | POA: Diagnosis not present

## 2017-04-20 DIAGNOSIS — I517 Cardiomegaly: Secondary | ICD-10-CM | POA: Diagnosis not present

## 2017-04-20 LAB — URINALYSIS, ROUTINE W REFLEX MICROSCOPIC
BILIRUBIN URINE: NEGATIVE
GLUCOSE, UA: NEGATIVE mg/dL
Ketones, ur: NEGATIVE mg/dL
NITRITE: NEGATIVE
PH: 7 (ref 5.0–8.0)
Protein, ur: 100 mg/dL — AB
SPECIFIC GRAVITY, URINE: 1.009 (ref 1.005–1.030)

## 2017-04-20 LAB — COMPREHENSIVE METABOLIC PANEL
ALBUMIN: 3.9 g/dL (ref 3.5–5.0)
ALT: 20 U/L (ref 14–54)
AST: 30 U/L (ref 15–41)
Alkaline Phosphatase: 104 U/L (ref 38–126)
Anion gap: 10 (ref 5–15)
BILIRUBIN TOTAL: 1.5 mg/dL — AB (ref 0.3–1.2)
BUN: 15 mg/dL (ref 6–20)
CO2: 28 mmol/L (ref 22–32)
Calcium: 9.1 mg/dL (ref 8.9–10.3)
Chloride: 93 mmol/L — ABNORMAL LOW (ref 101–111)
Creatinine, Ser: 1 mg/dL (ref 0.44–1.00)
GFR calc Af Amer: 54 mL/min — ABNORMAL LOW (ref >60)
GFR calc non Af Amer: 47 mL/min — ABNORMAL LOW (ref >60)
GLUCOSE: 139 mg/dL — AB (ref 65–99)
POTASSIUM: 5.4 mmol/L — AB (ref 3.5–5.1)
Sodium: 131 mmol/L — ABNORMAL LOW (ref 135–145)
TOTAL PROTEIN: 7.8 g/dL (ref 6.5–8.1)

## 2017-04-20 LAB — INFLUENZA PANEL BY PCR (TYPE A & B)
Influenza A By PCR: NEGATIVE
Influenza B By PCR: NEGATIVE

## 2017-04-20 LAB — CBC
HEMATOCRIT: 42.1 % (ref 36.0–46.0)
HEMOGLOBIN: 14.6 g/dL (ref 12.0–15.0)
MCH: 29.4 pg (ref 26.0–34.0)
MCHC: 34.7 g/dL (ref 30.0–36.0)
MCV: 84.7 fL (ref 78.0–100.0)
Platelets: 250 10*3/uL (ref 150–400)
RBC: 4.97 MIL/uL (ref 3.87–5.11)
RDW: 14.2 % (ref 11.5–15.5)
WBC: 10 10*3/uL (ref 4.0–10.5)

## 2017-04-20 LAB — I-STAT CG4 LACTIC ACID, ED: Lactic Acid, Venous: 1.36 mmol/L (ref 0.5–1.9)

## 2017-04-20 LAB — MRSA PCR SCREENING: MRSA by PCR: POSITIVE — AB

## 2017-04-20 MED ORDER — SODIUM CHLORIDE 0.9 % IV BOLUS (SEPSIS): 1000.0000 mL | Freq: Once | INTRAVENOUS | Status: DC

## 2017-04-20 MED ORDER — VITAMIN B-1 100 MG PO TABS
100.0000 mg | ORAL_TABLET | Freq: Every day | ORAL | Status: DC
  Administered 2017-04-20 – 2017-04-23 (×4): 100 mg via ORAL
  Filled 2017-04-20 (×4): qty 1

## 2017-04-20 MED ORDER — ONDANSETRON HCL 4 MG/2ML IJ SOLN: 4.0000 mg | Freq: Four times a day (QID) | INTRAMUSCULAR | Status: DC | PRN

## 2017-04-20 MED ORDER — LEVOTHYROXINE SODIUM 50 MCG PO TABS
50.0000 ug | ORAL_TABLET | Freq: Every day | ORAL | Status: DC
  Administered 2017-04-21 – 2017-04-23 (×3): 50 ug via ORAL
  Filled 2017-04-20 (×3): qty 1

## 2017-04-20 MED ORDER — GUAIFENESIN ER 600 MG PO TB12: 600.0000 mg | ORAL_TABLET | Freq: Two times a day (BID) | ORAL | Status: DC | PRN

## 2017-04-20 MED ORDER — ADULT MULTIVITAMIN W/MINERALS CH
1.0000 | ORAL_TABLET | Freq: Every day | ORAL | Status: DC
  Administered 2017-04-20 – 2017-04-23 (×4): 1 via ORAL
  Filled 2017-04-20 (×4): qty 1

## 2017-04-20 MED ORDER — ACETAMINOPHEN 325 MG PO TABS
650.0000 mg | ORAL_TABLET | Freq: Four times a day (QID) | ORAL | Status: DC | PRN
  Administered 2017-04-22 – 2017-04-23 (×2): 650 mg via ORAL
  Filled 2017-04-20 (×2): qty 2

## 2017-04-20 MED ORDER — LATANOPROST 0.005 % OP SOLN
1.0000 [drp] | Freq: Every day | OPHTHALMIC | Status: DC
  Administered 2017-04-20 – 2017-04-22 (×3): 1 [drp] via OPHTHALMIC
  Filled 2017-04-20: qty 2.5

## 2017-04-20 MED ORDER — ACETAMINOPHEN 650 MG RE SUPP: 650.0000 mg | Freq: Four times a day (QID) | RECTAL | Status: DC | PRN

## 2017-04-20 MED ORDER — SODIUM CHLORIDE 0.9 % IV BOLUS (SEPSIS): 1250.0000 mL | Freq: Once | INTRAVENOUS | Status: DC

## 2017-04-20 MED ORDER — DEXTROSE 5 % IV SOLN
1.0000 g | Freq: Once | INTRAVENOUS | Status: AC
  Administered 2017-04-20: 1 g via INTRAVENOUS
  Filled 2017-04-20: qty 10

## 2017-04-20 MED ORDER — RISA-BID PROBIOTIC PO TABS: 1.0000 | ORAL_TABLET | Freq: Every day | ORAL | Status: DC

## 2017-04-20 MED ORDER — RISAQUAD PO CAPS
1.0000 | ORAL_CAPSULE | Freq: Every day | ORAL | Status: DC
  Administered 2017-04-21 – 2017-04-23 (×3): 1 via ORAL
  Filled 2017-04-20 (×3): qty 1

## 2017-04-20 MED ORDER — ONDANSETRON HCL 4 MG PO TABS: 4.0000 mg | ORAL_TABLET | Freq: Four times a day (QID) | ORAL | Status: DC | PRN

## 2017-04-20 MED ORDER — DEXTROSE 5 % IV SOLN
1.0000 g | INTRAVENOUS | Status: DC
  Administered 2017-04-21 – 2017-04-22 (×2): 1 g via INTRAVENOUS
  Filled 2017-04-20 (×3): qty 10

## 2017-04-20 MED ORDER — VITAMIN D3 25 MCG (1000 UNIT) PO TABS
1000.0000 [IU] | ORAL_TABLET | Freq: Every day | ORAL | Status: DC
  Administered 2017-04-21 – 2017-04-23 (×3): 1000 [IU] via ORAL
  Filled 2017-04-20 (×3): qty 1

## 2017-04-20 MED ORDER — APIXABAN 5 MG PO TABS
5.0000 mg | ORAL_TABLET | Freq: Two times a day (BID) | ORAL | Status: DC
  Administered 2017-04-20 – 2017-04-23 (×6): 5 mg via ORAL
  Filled 2017-04-20 (×6): qty 1

## 2017-04-20 MED ORDER — SENNA 8.6 MG PO TABS: 1.0000 | ORAL_TABLET | Freq: Every day | ORAL | Status: DC | PRN

## 2017-04-20 MED ORDER — DEXTROSE-NACL 5-0.9 % IV SOLN
INTRAVENOUS | Status: DC
  Administered 2017-04-20 – 2017-04-21 (×2): via INTRAVENOUS

## 2017-04-20 NOTE — ED Notes (Signed)
Bed assigned @ 15:17

## 2017-04-20 NOTE — ED Notes (Signed)
Patient transported to X-ray 

## 2017-04-20 NOTE — H&P (Signed)
History and Physical    Katherine Miranda YBO:[id number] DOB: 06-15-1920 DOA: 04/20/2017  PCP: [name], MD   Patient coming from: SNF  Chief Complaint: Altered mental status.   HPI: Katherine Miranda is a 83 y.o. female with medical history significant of moderate to severe dementia, wheelchair and bedbound, who presents with altered mentation. She was noticed to have worsening confusion, disorientation and episodes of being unresponsive over last 48 hours. The symptoms have been persistent and severe in intensity, associated with elevated blood pressure and progressive weakness. There seems to be no improving or worsening factors.   About 7 days ago she had similar symptoms, that were self limited, but still  prompted a urine culture which resulted in greater than 100,000 colony-forming units, of unspecified bacteria. She was started on ciprofloxacin 24 hours ago.   Patient has had aspiration pneumonias in the past, her dementia is advanced, she needs assistance for her activities of daily living.   All information was obtained from her daughter and son at the bedside patient is unable to give any detailed history due to her altered mental status.   ED Course: Patient was found encephalopathic, with very high risk of worsening mental status. She received IV fluids and IV antibiotics, referred for admission and further evaluation.   Review of Systems:  Unable to obtain due to patient's altered mentation   Past Medical History:  Diagnosis Date  . A-fib ([place])   . Arthritis   . Dementia   . Hypertension     Past Surgical History:  Procedure Laterality Date  . BACK SURGERY    . CARDIAC CATHETERIZATION N/A 04/20/2015   Procedure: Temporary Pacemaker;  Surgeon: Lorretta Harp, MD;  Location: [facility];  Service: Cardiovascular;  Laterality: N/A;  . KNEE ARTHROSCOPY       reports that  has never smoked. she has never used smokeless tobacco. She reports that she does  not drink alcohol or use drugs.  No Known Allergies  Family History  Problem Relation Age of Onset  . Heart disease Father   . Diabetes Sister   . Dementia Brother      Prior to Admission medications   Medication Sig Start Date End Date Taking? Authorizing Provider  cholecalciferol (VITAMIN D) 1000 units tablet Take 1,000 Units daily by mouth.   Yes [provider]  ciprofloxacin (CIPRO) 250 MG tablet Take 250 mg 2 (two) times daily by mouth. Started 11/18 - End on 11/24 04/18/17  Yes [provider]  ELIQUIS 5 MG TABS tablet Take 5 mg 2 (two) times daily by mouth. 02/28/17  Yes [provider]  guaiFENesin (MUCINEX) 600 MG 12 hr tablet Take 600 mg by mouth 2 (two) times daily as needed for cough or to loosen phlegm.   Yes [provider]  Probiotic Product (RISA-BID PROBIOTIC) TABS Take 1 tablet daily by mouth.   Yes [provider]  senna (SENOKOT) 8.6 MG TABS tablet Take 1 tablet daily as needed by mouth for mild constipation.   Yes [provider]  SYNTHROID 50 MCG tablet Take 50 mcg daily by mouth. 02/26/17  Yes [provider]  Travoprost, BAK Free, (TRAVATAN) 0.004 % SOLN ophthalmic solution Place 1 drop at bedtime into both eyes.   Yes [provider]  ELIQUIS 2.5 MG TABS tablet TAKE 1 TABLET TWICE A DAY Patient not taking: Reported on 04/20/2017 12/28/14   Evans Lance, MD  levothyroxine (SYNTHROID, LEVOTHROID) 25 MCG  tablet Take 1.5 tablets (37.5 mcg total) by mouth daily before breakfast. Patient not taking: Reported on 04/20/2017 08/31/16   Nita Sells, MD    Physical Exam: Vitals:   04/20/17 1023 04/20/17 1137 04/20/17 1210 04/20/17 1350  BP: (!) 163/118  (!) 165/85 (!) 95/57  Pulse:   65 60  Resp: 16  16 16   Temp:  99.8 F (37.7 C)    TempSrc:  Rectal    SpO2: 100%  96% 96%  Weight:      Height:        Constitutional: deconditioned and ill looking appearing.  Vitals:   04/20/17  1023 04/20/17 1137 04/20/17 1210 04/20/17 1350  BP: (!) 163/118  (!) 165/85 (!) 95/57  Pulse:   65 60  Resp: 16  16 16   Temp:  99.8 F (37.7 C)    TempSrc:  Rectal    SpO2: 100%  96% 96%  Weight:      Height:       Eyes: PERRL, lids and conjunctivae pale with no icterus. Head normocephalic, nose and ears with no deformities.  ENMT: Mucous membranes are dry. Posterior pharynx clear of any exudate or lesions.Normal dentition.  Neck: normal, supple, no masses, no thyromegaly Respiratory: decreased breath sounds to auscultation bilaterally at bases due to poor inspiratory effort, no wheezing, no crackles. Normal respiratory effort. No accessory muscle use.  Cardiovascular: Regular rate and rhythm, no murmurs / rubs / gallops. No extremity edema. 2+ pedal pulses. No carotid bruits.  Abdomen: no tenderness, no masses palpated. No hepatosplenomegaly. Bowel sounds positive.  Musculoskeletal: no clubbing / cyanosis. No joint deformity upper and lower extremities. Good ROM, no contractures. Normal muscle tone.  Skin: no rashes, lesions, ulcers. No induration Neurologic: Patient is somnolent, opens her eyes to voice and touch, responses to simple questions and follows simple commands  Labs on Admission: I have personally reviewed following labs and imaging studies  CBC: Recent Labs  Lab 04/20/17 1207  WBC 10.0  HGB 14.6  HCT 42.1  MCV 84.7  PLT 161   Basic Metabolic Panel: Recent Labs  Lab 04/20/17 1207  NA 131*  K 5.4*  CL 93*  CO2 28  GLUCOSE 139*  BUN 15  CREATININE 1.00  CALCIUM 9.1   GFR: Estimated Creatinine Clearance: 34.2 mL/min (by C-G formula based on SCr of 1 mg/dL). Liver Function Tests: Recent Labs  Lab 04/20/17 1207  AST 30  ALT 20  ALKPHOS 104  BILITOT 1.5*  PROT 7.8  ALBUMIN 3.9   No results for input(s): LIPASE, AMYLASE in the last 168 hours. No results for input(s): AMMONIA in the last 168 hours. Coagulation Profile: No results for input(s): INR,  PROTIME in the last 168 hours. Cardiac Enzymes: No results for input(s): CKTOTAL, CKMB, CKMBINDEX, TROPONINI in the last 168 hours. BNP (last 3 results) No results for input(s): PROBNP in the last 8760 hours. HbA1C: No results for input(s): HGBA1C in the last 72 hours. CBG: No results for input(s): GLUCAP in the last 168 hours. Lipid Profile: No results for input(s): CHOL, HDL, LDLCALC, TRIG, CHOLHDL, LDLDIRECT in the last 72 hours. Thyroid Function Tests: No results for input(s): TSH, T4TOTAL, FREET4, T3FREE, THYROIDAB in the last 72 hours. Anemia Panel: No results for input(s): VITAMINB12, FOLATE, FERRITIN, TIBC, IRON, RETICCTPCT in the last 72 hours. Urine analysis:    Component Value Date/Time   COLORURINE YELLOW 04/20/2017 1125   APPEARANCEUR CLOUDY (A) 04/20/2017 1125   LABSPEC 1.009 04/20/2017 1125  PHURINE 7.0 04/20/2017 1125   GLUCOSEU NEGATIVE 04/20/2017 1125   HGBUR SMALL (A) 04/20/2017 1125   BILIRUBINUR NEGATIVE 04/20/2017 1125   KETONESUR NEGATIVE 04/20/2017 1125   PROTEINUR 100 (A) 04/20/2017 1125   UROBILINOGEN 0.2 04/15/2015 1509   NITRITE NEGATIVE 04/20/2017 1125   LEUKOCYTESUR LARGE (A) 04/20/2017 1125    Radiological Exams on Admission: Dg Chest 2 View  Result Date: 04/20/2017 CLINICAL DATA:  Altered mental status. EXAM: CHEST  2 VIEW COMPARISON:  Chest x-ray dated August 27, 2016. FINDINGS: Stable cardiomegaly. Normal pulmonary vascularity. Atherosclerotic calcification of the aortic arch. No focal consolidation, pleural effusion, or pneumothorax. No acute osseous abnormality. IMPRESSION: Stable cardiomegaly.  No active cardiopulmonary disease. Electronically Signed   By: Titus Dubin M.D.   On: 04/20/2017 11:38   Ct Head Wo Contrast  Result Date: 04/20/2017 CLINICAL DATA:  Unresponsive this morning EXAM: CT HEAD WITHOUT CONTRAST TECHNIQUE: Contiguous axial images were obtained from the base of the skull through the vertex without intravenous contrast.  COMPARISON:  06/29/2016 FINDINGS: Brain: Global atrophy. Extensive chronic ischemic changes in the periventricular white matter. No mass effect, midline shift, or acute hemorrhage. Vascular: No hyperdense vessel or unexpected calcification. Skull: Cranium is intact. Sinuses/Orbits: No acute finding. Other: None. IMPRESSION: Atrophy and chronic ischemic changes. Electronically Signed   By: Marybelle Killings M.D.   On: 04/20/2017 11:02    EKG: Independently reviewed.   Assessment/Plan Active Problems:   Encephalopathy  This is a 83 year old female with moderate to severe dementia who presents with worsening altered mentation for last 48 hours, with episodes of unresponsiveness. She has been recently diagnosed with urinary tract infection. On her initial physical examination, blood pressure 165/85, 95/57, heart rate 65, respirations 16, oxygen saturation 96%. Dry mucous membranes, lungs with decreased breath sounds due to poor inspiration, heart S1-S2 present rhythmic, the abdomen soft nontender, no lower extremity edema, neurologically patient is somnolent but is able to open her eyes to voice and touch, follows simple commands and responds to simple questions. Sodium 131, potassium 5.4, chloride 93, bicarbonate 28, glucose 139, BUN 15, creatinine 1.0, white count 10.0, hemoglobin 14.6, hematocrit 42.1, platelets 250, urinalysis with too numerous to count white cells, head CT with atrophy no acute changes, chest x-ray with left rotation, no infiltrates or effusions.   Working diagnosis: Metabolic encephalopathy due to urinary tract infection.   1. Metabolic encephalopathy due to urinary tract infection. Patient will be admitted to the medical ward, she will be placed on intravenous antibiotic therapy with ceftriaxone, continue isotonic IV fluids, telemetry monitoring and neuro checks every 4 hours. Follow-up on cultures, cell count and temperature curve. Old records personally reviewed, in 2017 patient had  blood cultures and urine culture positive for Escherichia coli which was sensitive to ceftriaxone.   2. Dementia. Continue neuro checks every 4 hours, aspiration precautions, will add thiamine and multivitamins.  3. Chronic atrial fibrillation. Currently rate controlled, continue anticoagulation with apixaban.  4. Hypothyroidism. Continue levothyroxine.  DVT prophylaxis: enoxaparin Code Status: DNR  Family Communication: I spoke with patient's family at the bedside and all questions were addressed.  Disposition Plan: SNF  Consults called: None  Admission status: Inpatient.     Mauricio Gerome Apley MD [facility] Pager [phone]  If 7PM-7AM, please contact night-coverage [email or web address] Password [email or web address]  04/20/2017, 3:13 PM

## 2017-04-20 NOTE — ED Notes (Signed)
Bed: [facility] Expected date:  Expected time:  Means of arrival:  Comments: EMS-altered-go into rm 12

## 2017-04-20 NOTE — ED Provider Notes (Signed)
[facility] DEPT Provider Note   CSN: [id number] Arrival date & time: 04/20/17  7510     History   Chief Complaint Chief Complaint  Patient presents with  . Altered Mental Status   Level 5 caveat: Altered mental status  HPI Katherine Miranda is a 83 y.o. female.  HPI Patient is a 83 year old female on chronic anticoagulation for atrial fibrillation and recent confusion through the weekend.  Patient was started on antibiotics for urinary tract infection on Sunday evening.  She was started on ciprofloxacin 250 mg p.o. twice daily.  Family reports no recent falls or change in her medications otherwise.  No reported fevers.  No reported vomiting.  Family reports decreased oral intake over the past 24 hours.  Patient is altered and unable to provide any reasonable history.   Past Medical History:  Diagnosis Date  . A-fib ([place])   . Arthritis   . Dementia   . Hypertension     Patient Active Problem List   Diagnosis Date Noted  . Community acquired pneumonia of right middle lobe of lung ([place]) 08/28/2016  . CKD (chronic kidney disease), stage III ([place]) 08/28/2016  . Normocytic anemia 08/28/2016  . UTI (urinary tract infection) 09/19/2015  . Bilateral pneumonia 09/18/2015  . Delirium 09/18/2015  . Dysphagia 07/17/2015  . UTI (lower urinary tract infection) 07/15/2015  . Hypothyroidism (acquired)   . Symptomatic bradycardia 04/20/2015  . Essential hypertension 09/19/2013  . Atrial fibrillation, chronic ([place]) 01/03/2013  . Subdural hygroma 10/06/2012  . Fall 10/06/2012  . Dementia 10/06/2012  . Hyponatremia 10/06/2012  . Dyslipidemia 10/06/2012    Past Surgical History:  Procedure Laterality Date  . BACK SURGERY    . CARDIAC CATHETERIZATION N/A 04/20/2015   Procedure: Temporary Pacemaker;  Surgeon: Lorretta Harp, MD;  Location: [facility];  Service: Cardiovascular;  Laterality: N/A;  . KNEE ARTHROSCOPY      OB History    No  data available       Home Medications    Prior to Admission medications   Medication Sig Start Date End Date Taking? Authorizing Provider  cholecalciferol (VITAMIN D) 1000 units tablet Take 1,000 Units daily by mouth.   Yes [provider]  ciprofloxacin (CIPRO) 250 MG tablet Take 250 mg 2 (two) times daily by mouth. Started 11/18 - End on 11/24 04/18/17  Yes [provider]  ELIQUIS 5 MG TABS tablet Take 5 mg 2 (two) times daily by mouth. 02/28/17  Yes [provider]  guaiFENesin (MUCINEX) 600 MG 12 hr tablet Take 600 mg by mouth 2 (two) times daily as needed for cough or to loosen phlegm.   Yes [provider]  Probiotic Product (RISA-BID PROBIOTIC) TABS Take 1 tablet daily by mouth.   Yes [provider]  senna (SENOKOT) 8.6 MG TABS tablet Take 1 tablet daily as needed by mouth for mild constipation.   Yes [provider]  SYNTHROID 50 MCG tablet Take 50 mcg daily by mouth. 02/26/17  Yes [provider]  Travoprost, BAK Free, (TRAVATAN) 0.004 % SOLN ophthalmic solution Place 1 drop at bedtime into both eyes.   Yes [provider]  ELIQUIS 2.5 MG TABS tablet TAKE 1 TABLET TWICE A DAY Patient not taking: Reported on 04/20/2017 12/28/14   Evans Lance, MD  levothyroxine (SYNTHROID, LEVOTHROID) 25 MCG tablet Take 1.5 tablets (37.5 mcg total) by mouth daily before breakfast. Patient not taking: Reported on 04/20/2017 08/31/16   Verlon Au,  Darylene Price, MD    Family History Family History  Problem Relation Age of Onset  . Heart disease Father   . Diabetes Sister   . Dementia Brother     Social History Social History   Tobacco Use  . Smoking status: Never Smoker  . Smokeless tobacco: Never Used  Substance Use Topics  . Alcohol use: No  . Drug use: No     Allergies   Patient has no known allergies.   Review of Systems Review of Systems  Unable to perform ROS: Mental status change     Physical  Exam Updated Vital Signs BP (!) 95/57 (BP Location: Left Arm)   Pulse 60   Temp 99.8 F (37.7 C) (Rectal)   Resp 16   Ht 5\' 5"  (1.651 m)   Wt 71.7 kg (158 lb)   SpO2 96%   BMI 26.29 kg/m   Physical Exam  Constitutional: She appears well-developed and well-nourished.  HENT:  Head: Normocephalic.  Eyes: EOM are normal.  Neck: Normal range of motion.  Cardiovascular: Normal rate.  Pulmonary/Chest: Effort normal.  Abdominal: She exhibits no distension.  Musculoskeletal: Normal range of motion.  Neurological:  Opens eyes to voice.  Follows simple commands.  Squeezes hands bilaterally.  Psychiatric: She has a normal mood and affect.  Nursing note and vitals reviewed.    ED Treatments / Results  Labs (all labs ordered are listed, but only abnormal results are displayed) Labs Reviewed  COMPREHENSIVE METABOLIC PANEL - Abnormal; Notable for the following components:      Result Value   Sodium 131 (*)    Potassium 5.4 (*)    Chloride 93 (*)    Glucose, Bld 139 (*)    Total Bilirubin 1.5 (*)    GFR calc non Af Amer 47 (*)    GFR calc Af Amer 54 (*)    All other components within normal limits  URINALYSIS, ROUTINE W REFLEX MICROSCOPIC - Abnormal; Notable for the following components:   APPearance CLOUDY (*)    Hgb urine dipstick SMALL (*)    Protein, ur 100 (*)    Leukocytes, UA LARGE (*)    Bacteria, UA MANY (*)    Squamous Epithelial / LPF 0-5 (*)    All other components within normal limits  CULTURE, BLOOD (ROUTINE X 2)  CULTURE, BLOOD (ROUTINE X 2)  URINE CULTURE  CBC  INFLUENZA PANEL BY PCR (TYPE A & B)    EKG  EKG Interpretation None       Radiology Dg Chest 2 View  Result Date: 04/20/2017 CLINICAL DATA:  Altered mental status. EXAM: CHEST  2 VIEW COMPARISON:  Chest x-ray dated August 27, 2016. FINDINGS: Stable cardiomegaly. Normal pulmonary vascularity. Atherosclerotic calcification of the aortic arch. No focal consolidation, pleural effusion, or  pneumothorax. No acute osseous abnormality. IMPRESSION: Stable cardiomegaly.  No active cardiopulmonary disease. Electronically Signed   By: Titus Dubin M.D.   On: 04/20/2017 11:38   Ct Head Wo Contrast  Result Date: 04/20/2017 CLINICAL DATA:  Unresponsive this morning EXAM: CT HEAD WITHOUT CONTRAST TECHNIQUE: Contiguous axial images were obtained from the base of the skull through the vertex without intravenous contrast. COMPARISON:  06/29/2016 FINDINGS: Brain: Global atrophy. Extensive chronic ischemic changes in the periventricular white matter. No mass effect, midline shift, or acute hemorrhage. Vascular: No hyperdense vessel or unexpected calcification. Skull: Cranium is intact. Sinuses/Orbits: No acute finding. Other: None. IMPRESSION: Atrophy and chronic ischemic changes. Electronically Signed   By: Arnell Sieving  Hoss M.D.   On: 04/20/2017 11:02    Procedures Procedures (including critical care time)  Medications Ordered in ED Medications  cefTRIAXone (ROCEPHIN) 1 g in dextrose 5 % 50 mL IVPB (1 g Intravenous New Bag/Given 04/20/17 1425)     Initial Impression / Assessment and Plan / ED Course  I have reviewed the triage vital signs and the nursing notes.  Pertinent labs & imaging results that were available during my care of the patient were reviewed by me and considered in my medical decision making (see chart for details).     Patient with altered mental status.  Likely delirium secondary to urinary tract infection.  Patient will be admitted for ongoing altered mental status.  Urine culture sent.  Rocephin givin.  Family updated  Final Clinical Impressions(s) / ED Diagnoses   Final diagnoses:  Altered mental status, unspecified altered mental status type  Acute cystitis with hematuria    ED Discharge Orders    None       Jola Schmidt, MD 04/20/17 1446

## 2017-04-20 NOTE — ED Triage Notes (Signed)
EMS reports AMS since yesterday. Recent dx of UTI on Cipro. Hx of A-fib on Eloquis.  BP 140/80 HR 60 Sp02 97 Nasal Cannula 2lt CBG 185 20ga L forearm

## 2017-04-21 ENCOUNTER — Encounter (HOSPITAL_COMMUNITY): Payer: Self-pay

## 2017-04-21 DIAGNOSIS — G9340 Encephalopathy, unspecified: Secondary | ICD-10-CM

## 2017-04-21 DIAGNOSIS — N3001 Acute cystitis with hematuria: Principal | ICD-10-CM

## 2017-04-21 DIAGNOSIS — R4182 Altered mental status, unspecified: Secondary | ICD-10-CM

## 2017-04-21 LAB — COMPREHENSIVE METABOLIC PANEL
ALK PHOS: 81 U/L (ref 38–126)
ALT: 16 U/L (ref 14–54)
ANION GAP: 6 (ref 5–15)
AST: 21 U/L (ref 15–41)
Albumin: 3.1 g/dL — ABNORMAL LOW (ref 3.5–5.0)
BILIRUBIN TOTAL: 0.9 mg/dL (ref 0.3–1.2)
BUN: 21 mg/dL — ABNORMAL HIGH (ref 6–20)
CALCIUM: 8.5 mg/dL — AB (ref 8.9–10.3)
CO2: 28 mmol/L (ref 22–32)
Chloride: 99 mmol/L — ABNORMAL LOW (ref 101–111)
Creatinine, Ser: 1.21 mg/dL — ABNORMAL HIGH (ref 0.44–1.00)
GFR calc non Af Amer: 37 mL/min — ABNORMAL LOW (ref >60)
GFR, EST AFRICAN AMERICAN: 43 mL/min — AB (ref >60)
Glucose, Bld: 131 mg/dL — ABNORMAL HIGH (ref 65–99)
POTASSIUM: 3.6 mmol/L (ref 3.5–5.1)
SODIUM: 133 mmol/L — AB (ref 135–145)
TOTAL PROTEIN: 6.1 g/dL — AB (ref 6.5–8.1)

## 2017-04-21 LAB — CBC
HEMATOCRIT: 36.3 % (ref 36.0–46.0)
HEMOGLOBIN: 12.1 g/dL (ref 12.0–15.0)
MCH: 28.5 pg (ref 26.0–34.0)
MCHC: 33.3 g/dL (ref 30.0–36.0)
MCV: 85.6 fL (ref 78.0–100.0)
Platelets: 207 10*3/uL (ref 150–400)
RBC: 4.24 MIL/uL (ref 3.87–5.11)
RDW: 14.7 % (ref 11.5–15.5)
WBC: 6.7 10*3/uL (ref 4.0–10.5)

## 2017-04-21 LAB — URINE CULTURE: Culture: NO GROWTH

## 2017-04-21 MED ORDER — CHLORHEXIDINE GLUCONATE CLOTH 2 % EX PADS
6.0000 | MEDICATED_PAD | Freq: Every day | CUTANEOUS | Status: DC
  Administered 2017-04-21 – 2017-04-23 (×2): 6 via TOPICAL

## 2017-04-21 MED ORDER — SODIUM CHLORIDE 0.9 % IV BOLUS (SEPSIS): 500.0000 mL | Freq: Once | INTRAVENOUS | Status: DC

## 2017-04-21 MED ORDER — MUPIROCIN 2 % EX OINT
1.0000 "application " | TOPICAL_OINTMENT | Freq: Two times a day (BID) | CUTANEOUS | Status: DC
  Administered 2017-04-21 – 2017-04-23 (×6): 1 via NASAL
  Filled 2017-04-21: qty 22

## 2017-04-21 MED ORDER — SODIUM CHLORIDE 0.9 % IV BOLUS (SEPSIS)
250.0000 mL | Freq: Once | INTRAVENOUS | Status: AC
  Administered 2017-04-21: 250 mL via INTRAVENOUS

## 2017-04-21 NOTE — Clinical Social Work Note (Signed)
Clinical Social Work Assessment  Patient Details  Name: Katherine Miranda MRN: [id number] Date of Birth: Mar 26, 1921  Date of referral:  04/21/17               Reason for consult:  Facility Placement, Discharge Planning                Permission sought to share information with:  Facility Art therapist granted to share information::  Yes, Verbal Permission Granted  Name::        Agency::     Relationship::     Contact Information:     Housing/Transportation Living arrangements for the past 2 months:  Skilled Nursing Facility([facility]) Source of Information:  Adult Children Patient Interpreter Needed:  None Criminal Activity/Legal Involvement Pertinent to Current Situation/Hospitalization:  No - Comment as needed Significant Relationships:  Adult Children Lives with:  Facility Resident Do you feel safe going back to the place where you live?  Yes Need for family participation in patient care:  Yes (Comment)  Care giving concerns:  Patient from [facility], [facility] term care resident. Patient admitted with altered mental status.    Social Worker assessment / plan:  CSW spoke with patient's daughter/POA Katherine Miranda [phone]) via telephone, regarding discharge planning. Patient's daughter reported that patient has been at [facility] since April of 2018 and that facility has lots of good staff. Patient's daughter reported that the plan is for patient to return to SNF at discharge.   CSW will complete FL2 and continue to follow to assist patient with discharge back to SNF.   Employment status:  Retired Forensic scientist:  Medicare PT Recommendations:  Not assessed at this time Information / Referral to community resources:  [place]  Patient/Family's Response to care:  Patient's daughter appreciative of CSW assistance with discharge planning. Patient's daughter reported that patient has received "very good care" at  the hospital and that the doctors she has spoke with "explained things well".  Patient/Family's Understanding of and Emotional Response to Diagnosis, Current Treatment, and Prognosis:  Patient only oriented to self and unable to participate in assessment. Patient's daughter verbalized strong understanding of patient's diagnosis and current treatment. Patient's daughter involved in patient's care and verbalized plan for patient to dc back to SNF.   Emotional Assessment Appearance:    Attitude/Demeanor/Rapport:  Unable to Assess Affect (typically observed):  Unable to Assess Orientation:  Oriented to Self Alcohol / Substance use:  Not Applicable Psych involvement (Current and /or in the community):  No (Comment)  Discharge Needs  Concerns to be addressed:  Care Coordination Readmission within the last 30 days:  No Current discharge risk:  None Barriers to Discharge:  Continued Medical Work up   [employer], LCSW 04/21/2017, 4:40 PM

## 2017-04-21 NOTE — Progress Notes (Addendum)
CRITICAL VALUE ALERT  Critical Value:  MRSA positive  Date & Time Notied:  1930  Provider Notified: RN initiated protocol   Orders Received/Actions taken: Order set placed

## 2017-04-21 NOTE — Progress Notes (Signed)
PROGRESS NOTE  Katherine Miranda  UEA:[id number] DOB: 1921/04/02 DOA: 04/20/2017 PCP: [name], MD  Brief Narrative: Katherine Miranda is a 83 y.o. female with medical history significant of moderate to severe dementia, wheelchair and bedbound, who presents with altered mentation over last 48 hours, associated with elevated blood pressure and progressive weakness.  About 7 prior, she had similar symptoms, that were self limited, but still  prompted a urine culture which resulted in greater than 100,000 colony-forming units, of unspecified bacteria. She was started on ciprofloxacin 24 hours PTA. Patient was found encephalopathic, with very high risk of worsening mental status. Urinalysis showed TNTC WBCs. Head CT nonacute. She received IV fluids and IV antibiotics, referred for admission and further evaluation.   Assessment & Plan: Active Problems:   Encephalopathy  Metabolic encephalopathy due to urinary tract infection: Has hx E. coli UTI sensitive to CTX.  - Continue ceftriaxone  Dementia: Chronic.  - Delirium precautions  - Total care, aspiration precautions.  Chronic atrial fibrillation. Currently rate controlled, continue anticoagulation with apixaban. - Telemetry has shown only sinus bradycardia, ok to DC telemetry  Hypothyroidism: Last TSH wnl. - Continue levothyroxine.  DVT prophylaxis: Lovenox Code Status: DNR Family Communication: None at bedside this AM Disposition Plan: Continue monitoring for improvement  Consultants:   None  Procedures:   None  Antimicrobials:  Ceftriaxone   Subjective: Confused, but without complaints. Denies pain.   Objective: Vitals:   04/20/17 1700 04/20/17 2010 04/21/17 0550 04/21/17 1242  BP:  (!) 152/52 (!) 135/55 (!) 150/60  Pulse:  63 (!) 53 (!) 53  Resp:  13 16 18   Temp: 98.3 F (36.8 C) 98.7 F (37.1 C) 98 F (36.7 C) 98.4 F (36.9 C)  TempSrc: Oral Axillary Oral Oral  SpO2:  98% 100% 94%  Weight:        Height:       Gen: Elderly female in no distress  Pulm: Non-labored breathing room air. Clear to auscultation bilaterally.  CV: Regular borderline bradycardia. No murmur, rub, or gallop. No JVD, no pedal edema. GI: Abdomen soft, non-tender, non-distended, with normoactive bowel sounds. No organomegaly or masses felt. Ext: Warm, no deformities Skin: No rashes, lesions no ulcers Neuro: Alert, conversant but confused. No focal neurological deficits. Psych: UTD.  Data Reviewed: I have personally reviewed following labs and imaging studies  CBC: Recent Labs  Lab 04/20/17 1207 04/21/17 0433  WBC 10.0 6.7  HGB 14.6 12.1  HCT 42.1 36.3  MCV 84.7 85.6  PLT 250 478   Basic Metabolic Panel: Recent Labs  Lab 04/20/17 1207 04/21/17 0433  NA 131* 133*  K 5.4* 3.6  CL 93* 99*  CO2 28 28  GLUCOSE 139* 131*  BUN 15 21*  CREATININE 1.00 1.21*  CALCIUM 9.1 8.5*   GFR: Estimated Creatinine Clearance: 25.8 mL/min (A) (by C-G formula based on SCr of 1.21 mg/dL (H)). Liver Function Tests: Recent Labs  Lab 04/20/17 1207 04/21/17 0433  AST 30 21  ALT 20 16  ALKPHOS 104 81  BILITOT 1.5* 0.9  PROT 7.8 6.1*  ALBUMIN 3.9 3.1*   No results for input(s): LIPASE, AMYLASE in the last 168 hours. No results for input(s): AMMONIA in the last 168 hours. Coagulation Profile: No results for input(s): INR, PROTIME in the last 168 hours. Cardiac Enzymes: No results for input(s): CKTOTAL, CKMB, CKMBINDEX, TROPONINI in the last 168 hours. BNP (last 3 results) No results for input(s): PROBNP in the last 8760 hours. HbA1C: No results  for input(s): HGBA1C in the last 72 hours. CBG: No results for input(s): GLUCAP in the last 168 hours. Lipid Profile: No results for input(s): CHOL, HDL, LDLCALC, TRIG, CHOLHDL, LDLDIRECT in the last 72 hours. Thyroid Function Tests: No results for input(s): TSH, T4TOTAL, FREET4, T3FREE, THYROIDAB in the last 72 hours. Anemia Panel: No results for input(s):  VITAMINB12, FOLATE, FERRITIN, TIBC, IRON, RETICCTPCT in the last 72 hours. Urine analysis:    Component Value Date/Time   COLORURINE YELLOW 04/20/2017 1125   APPEARANCEUR CLOUDY (A) 04/20/2017 1125   LABSPEC 1.009 04/20/2017 1125   PHURINE 7.0 04/20/2017 1125   GLUCOSEU NEGATIVE 04/20/2017 1125   HGBUR SMALL (A) 04/20/2017 1125   BILIRUBINUR NEGATIVE 04/20/2017 1125   KETONESUR NEGATIVE 04/20/2017 1125   PROTEINUR 100 (A) 04/20/2017 1125   UROBILINOGEN 0.2 04/15/2015 1509   NITRITE NEGATIVE 04/20/2017 1125   LEUKOCYTESUR LARGE (A) 04/20/2017 1125   Recent Results (from the past 240 hour(s))  Urine culture     Status: None   Collection Time: 04/20/17 11:25 AM  Result Value Ref Range Status   Specimen Description URINE, CATHETERIZED  Final   Special Requests NONE  Final   Culture   Final    NO GROWTH Performed at [facility], [place] [address] [place], [place] [address]    Report Status 04/21/2017 FINAL  Final  Blood culture (routine x 2)     Status: None (Preliminary result)   Collection Time: 04/20/17 12:07 PM  Result Value Ref Range Status   Specimen Description BLOOD LEFT ANTECUBITAL  Final   Special Requests   Final    Blood Culture results may not be optimal due to an inadequate volume of blood received in culture bottles   Culture   Final    NO GROWTH 1 DAY Performed at [facility], [place] [address], [place], [place] [address]    Report Status PENDING  Incomplete  Blood culture (routine x 2)     Status: None (Preliminary result)   Collection Time: 04/20/17 12:21 PM  Result Value Ref Range Status   Specimen Description BLOOD RIGHT ANTECUBITAL  Final   Special Requests   Final    BOTTLES DRAWN AEROBIC AND ANAEROBIC Blood Culture results may not be optimal due to an inadequate volume of blood received in culture bottles   Culture   Final    NO GROWTH < 24 HOURS Performed at [facility], [place] [address] [place], [place] [address]    Report Status  PENDING  Incomplete  MRSA PCR Screening     Status: Abnormal   Collection Time: 04/20/17  5:13 PM  Result Value Ref Range Status   MRSA by PCR POSITIVE (A) NEGATIVE Final    Comment:        The GeneXpert MRSA Assay (FDA approved for NASAL specimens only), is one component of a comprehensive MRSA colonization surveillance program. It is not intended to diagnose MRSA infection nor to guide or monitor treatment for MRSA infections. RESULT CALLED TO, READ BACK BY AND VERIFIED WITH: Lise Auer [id number] @ 1933 BY J SCOTTON       Radiology Studies: Dg Chest 2 View  Result Date: 04/20/2017 CLINICAL DATA:  Altered mental status. EXAM: CHEST  2 VIEW COMPARISON:  Chest x-ray dated August 27, 2016. FINDINGS: Stable cardiomegaly. Normal pulmonary vascularity. Atherosclerotic calcification of the aortic arch. No focal consolidation, pleural effusion, or pneumothorax. No acute osseous abnormality. IMPRESSION: Stable cardiomegaly.  No active cardiopulmonary  disease. Electronically Signed   By: Titus Dubin M.D.   On: 04/20/2017 11:38   Ct Head Wo Contrast  Result Date: 04/20/2017 CLINICAL DATA:  Unresponsive this morning EXAM: CT HEAD WITHOUT CONTRAST TECHNIQUE: Contiguous axial images were obtained from the base of the skull through the vertex without intravenous contrast. COMPARISON:  06/29/2016 FINDINGS: Brain: Global atrophy. Extensive chronic ischemic changes in the periventricular white matter. No mass effect, midline shift, or acute hemorrhage. Vascular: No hyperdense vessel or unexpected calcification. Skull: Cranium is intact. Sinuses/Orbits: No acute finding. Other: None. IMPRESSION: Atrophy and chronic ischemic changes. Electronically Signed   By: Marybelle Killings M.D.   On: 04/20/2017 11:02    Scheduled Meds: . acidophilus  1 capsule Oral Daily  . apixaban  5 mg Oral BID  . Chlorhexidine Gluconate Cloth  6 each Topical Q0600  . cholecalciferol  1,000 Units Oral Daily  .  latanoprost  1 drop Both Eyes QHS  . levothyroxine  50 mcg Oral QAC breakfast  . multivitamin with minerals  1 tablet Oral Daily  . mupirocin ointment  1 application Nasal BID  . thiamine  100 mg Oral Daily   Continuous Infusions: . cefTRIAXone (ROCEPHIN)  IV Stopped (04/21/17 1440)  . dextrose 5 % and 0.9% NaCl 75 mL/hr at 04/21/17 0811  . sodium chloride       LOS: 1 day   Time spent: 25 minutes.  Vance Gather, MD [facility] Pager [phone]  If 7PM-7AM, please contact night-coverage [email or web address] Password [email or web address] 04/21/2017, 4:11 PM

## 2017-04-21 NOTE — Progress Notes (Signed)
Notified MD of no urine output during this shift. Bladder scan was done twice one at 0300 and at that time her scan was 96. Repeated scan at 0600 with a total of 306. Orders placed for a small bolus over one hour, repeat scan then if greater than 350 will intervene with in and out cath. Will continue to monitor patient closely.

## 2017-04-22 LAB — BASIC METABOLIC PANEL
Anion gap: 9 (ref 5–15)
BUN: 14 mg/dL (ref 6–20)
CHLORIDE: 103 mmol/L (ref 101–111)
CO2: 26 mmol/L (ref 22–32)
CREATININE: 0.85 mg/dL (ref 0.44–1.00)
Calcium: 8.7 mg/dL — ABNORMAL LOW (ref 8.9–10.3)
GFR calc Af Amer: >60 mL/min (ref >60)
GFR calc non Af Amer: 57 mL/min — ABNORMAL LOW (ref >60)
GLUCOSE: 107 mg/dL — AB (ref 65–99)
POTASSIUM: 3.6 mmol/L (ref 3.5–5.1)
Sodium: 138 mmol/L (ref 135–145)

## 2017-04-22 NOTE — Progress Notes (Signed)
PROGRESS NOTE  Katherine DEENEY  Miranda:[id number] DOB: 10-Jan-1921 DOA: 04/20/2017 PCP: [name], MD  Brief Narrative: Katherine Miranda is a 83 y.o. female with medical history significant of moderate to severe dementia, wheelchair and bedbound, who presents with altered mentation over last 48 hours, associated with elevated blood pressure and progressive weakness.  About 7 prior, she had similar symptoms, that were self limited, but still  prompted a urine culture which resulted in greater than 100,000 colony-forming units, of unspecified bacteria. She was started on ciprofloxacin 24 hours PTA. Patient was found encephalopathic, with very high risk of worsening mental status. Urinalysis showed TNTC WBCs. Head CT nonacute. She received IV fluids and IV antibiotics, referred for admission and further evaluation.   Assessment & Plan: Active Problems:   Encephalopathy  Metabolic encephalopathy due to urinary tract infection: Has hx E. coli UTI sensitive to CTX. Urine culture no growth (suspect due to having PTA abx).  - Continue ceftriaxone, plan to DC on keflex if stable.  - Monitor blood cultures, NGTD.  - May consider suppressive chronic abx, trimethoprim 100mg  daily, after abx for Tx.   Acute delirium on chronic dementia:  - Delirium precautions.  - Minimize restraints: Saline lock IV, voiding trial (pull foley) as below, DC SCDs with lovenox on board, discontinue heart monitor.  Acute urinary retention: ?Due to UTI.  - Pull foley today and monitor UOP.  History of aspiration pneumonia:  - Aspiration precautions, dysphagia diet per SLP last hospitalization.  Chronic atrial fibrillation. Currently rate controlled, continue anticoagulation with apixaban. - Telemetry has shown only sinus bradycardia, ok to DC telemetry  Hypothyroidism: Last TSH wnl. - Continue levothyroxine.  DVT prophylaxis: Lovenox Code Status: DNR Family Communication: Daughter at bedside Disposition Plan:  Continue monitoring for improvement in mental status, hopeful for DC back to [facility] 11/23.  Consultants:   None  Procedures:   None  Antimicrobials:  Ceftriaxone   Subjective: Not oriented. She is interactive but poorly verbal which is not her baseline. No unresponsive episodes reported. Eating when she is fed.  Objective: Vitals:   04/21/17 0550 04/21/17 1242 04/21/17 2052 04/22/17 0341  BP: (!) 135/55 (!) 150/60 133/70 (!) 158/70  Pulse: (!) 53 (!) 53 98 66  Resp: 16 18 16 18   Temp: 98 F (36.7 C) 98.4 F (36.9 C) 98 F (36.7 C) 97.8 F (36.6 C)  TempSrc: Oral Oral Oral Axillary  SpO2: 100% 94% 96% 96%  Weight:      Height:       Gen: Elderly female in no distress laying in bed, looking around Pulm: Non-labored breathing room air. Clear to auscultation bilaterally.  CV: Regular rate and rhythm. No murmur, rub, or gallop. No JVD, no pedal edema. GI: Abdomen soft, non-tender, non-distended, with normoactive bowel sounds. No organomegaly or masses felt. Ext: Warm, no deformities Skin: No rashes, lesions no ulcers Neuro: Alert, conversant but confused. HOH, otherwise no focal neurological deficits. Psych: UTD.  Data Reviewed: I have personally reviewed following labs and imaging studies  CBC: Recent Labs  Lab 04/20/17 1207 04/21/17 0433  WBC 10.0 6.7  HGB 14.6 12.1  HCT 42.1 36.3  MCV 84.7 85.6  PLT 250 767   Basic Metabolic Panel: Recent Labs  Lab 04/20/17 1207 04/21/17 0433 04/22/17 0444  NA 131* 133* 138  K 5.4* 3.6 3.6  CL 93* 99* 103  CO2 28 28 26   GLUCOSE 139* 131* 107*  BUN 15 21* 14  CREATININE 1.00 1.21* 0.85  CALCIUM 9.1 8.5* 8.7*   GFR: Estimated Creatinine Clearance: 36.7 mL/min (by C-G formula based on SCr of 0.85 mg/dL). Liver Function Tests: Recent Labs  Lab 04/20/17 1207 04/21/17 0433  AST 30 21  ALT 20 16  ALKPHOS 104 81  BILITOT 1.5* 0.9  PROT 7.8 6.1*  ALBUMIN 3.9 3.1*   No results for input(s): LIPASE, AMYLASE in the  last 168 hours. No results for input(s): AMMONIA in the last 168 hours. Coagulation Profile: No results for input(s): INR, PROTIME in the last 168 hours. Cardiac Enzymes: No results for input(s): CKTOTAL, CKMB, CKMBINDEX, TROPONINI in the last 168 hours. BNP (last 3 results) No results for input(s): PROBNP in the last 8760 hours. HbA1C: No results for input(s): HGBA1C in the last 72 hours. CBG: No results for input(s): GLUCAP in the last 168 hours. Lipid Profile: No results for input(s): CHOL, HDL, LDLCALC, TRIG, CHOLHDL, LDLDIRECT in the last 72 hours. Thyroid Function Tests: No results for input(s): TSH, T4TOTAL, FREET4, T3FREE, THYROIDAB in the last 72 hours. Anemia Panel: No results for input(s): VITAMINB12, FOLATE, FERRITIN, TIBC, IRON, RETICCTPCT in the last 72 hours. Urine analysis:    Component Value Date/Time   COLORURINE YELLOW 04/20/2017 1125   APPEARANCEUR CLOUDY (A) 04/20/2017 1125   LABSPEC 1.009 04/20/2017 1125   PHURINE 7.0 04/20/2017 1125   GLUCOSEU NEGATIVE 04/20/2017 1125   HGBUR SMALL (A) 04/20/2017 1125   BILIRUBINUR NEGATIVE 04/20/2017 1125   KETONESUR NEGATIVE 04/20/2017 1125   PROTEINUR 100 (A) 04/20/2017 1125   UROBILINOGEN 0.2 04/15/2015 1509   NITRITE NEGATIVE 04/20/2017 1125   LEUKOCYTESUR LARGE (A) 04/20/2017 1125   Recent Results (from the past 240 hour(s))  Urine culture     Status: None   Collection Time: 04/20/17 11:25 AM  Result Value Ref Range Status   Specimen Description URINE, CATHETERIZED  Final   Special Requests NONE  Final   Culture   Final    NO GROWTH Performed at [facility], [place] [address] [place], [place] [address]    Report Status 04/21/2017 FINAL  Final  Blood culture (routine x 2)     Status: None (Preliminary result)   Collection Time: 04/20/17 12:07 PM  Result Value Ref Range Status   Specimen Description BLOOD LEFT ANTECUBITAL  Final   Special Requests   Final    Blood Culture results may not be optimal due  to an inadequate volume of blood received in culture bottles   Culture   Final    NO GROWTH 2 DAYS Performed at [place] [address] [place], [place] [address]    Report Status PENDING  Incomplete  Blood culture (routine x 2)     Status: None (Preliminary result)   Collection Time: 04/20/17 12:21 PM  Result Value Ref Range Status   Specimen Description BLOOD RIGHT ANTECUBITAL  Final   Special Requests   Final    BOTTLES DRAWN AEROBIC AND ANAEROBIC Blood Culture results may not be optimal due to an inadequate volume of blood received in culture bottles   Culture   Final    NO GROWTH 2 DAYS Performed at [facility], [place] [address] [place], [place] [address]    Report Status PENDING  Incomplete  MRSA PCR Screening     Status: Abnormal   Collection Time: 04/20/17  5:13 PM  Result Value Ref Range Status   MRSA by PCR POSITIVE (A) NEGATIVE Final    Comment:  The GeneXpert MRSA Assay (FDA approved for NASAL specimens only), is one component of a comprehensive MRSA colonization surveillance program. It is not intended to diagnose MRSA infection nor to guide or monitor treatment for MRSA infections. RESULT CALLED TO, READ BACK BY AND VERIFIED WITH: MELISSA OBRYANT,RN [id number] @ [place]       Radiology Studies: No results found.  Scheduled Meds: . acidophilus  1 capsule Oral Daily  . apixaban  5 mg Oral BID  . Chlorhexidine Gluconate Cloth  6 each Topical Q0600  . cholecalciferol  1,000 Units Oral Daily  . latanoprost  1 drop Both Eyes QHS  . levothyroxine  50 mcg Oral QAC breakfast  . multivitamin with minerals  1 tablet Oral Daily  . mupirocin ointment  1 application Nasal BID  . thiamine  100 mg Oral Daily   Continuous Infusions: . cefTRIAXone (ROCEPHIN)  IV 1 g (04/22/17 1408)     LOS: 2 days   Time spent: 25 minutes.  Vance Gather, MD [facility] Pager [phone]  If 7PM-7AM, please contact  night-coverage [email or web address] Password [email or web address] 04/22/2017, 2:40 PM

## 2017-04-22 NOTE — Progress Notes (Signed)
The patient does not have VTE prophylaxis orders. The PCP on call was notified.

## 2017-04-23 DIAGNOSIS — R278 Other lack of coordination: Secondary | ICD-10-CM | POA: Diagnosis not present

## 2017-04-23 DIAGNOSIS — H401132 Primary open-angle glaucoma, bilateral, moderate stage: Secondary | ICD-10-CM | POA: Diagnosis not present

## 2017-04-23 DIAGNOSIS — R1310 Dysphagia, unspecified: Secondary | ICD-10-CM | POA: Diagnosis present

## 2017-04-23 DIAGNOSIS — I129 Hypertensive chronic kidney disease with stage 1 through stage 4 chronic kidney disease, or unspecified chronic kidney disease: Secondary | ICD-10-CM | POA: Diagnosis not present

## 2017-04-23 DIAGNOSIS — R319 Hematuria, unspecified: Secondary | ICD-10-CM | POA: Diagnosis not present

## 2017-04-23 DIAGNOSIS — M25561 Pain in right knee: Secondary | ICD-10-CM | POA: Diagnosis not present

## 2017-04-23 DIAGNOSIS — J111 Influenza due to unidentified influenza virus with other respiratory manifestations: Secondary | ICD-10-CM | POA: Diagnosis not present

## 2017-04-23 DIAGNOSIS — R4182 Altered mental status, unspecified: Secondary | ICD-10-CM | POA: Diagnosis not present

## 2017-04-23 DIAGNOSIS — R4181 Age-related cognitive decline: Secondary | ICD-10-CM | POA: Diagnosis not present

## 2017-04-23 DIAGNOSIS — F0390 Unspecified dementia without behavioral disturbance: Secondary | ICD-10-CM | POA: Diagnosis not present

## 2017-04-23 DIAGNOSIS — I482 Chronic atrial fibrillation: Secondary | ICD-10-CM | POA: Diagnosis not present

## 2017-04-23 DIAGNOSIS — H349 Unspecified retinal vascular occlusion: Secondary | ICD-10-CM | POA: Diagnosis not present

## 2017-04-23 DIAGNOSIS — N183 Chronic kidney disease, stage 3 (moderate): Secondary | ICD-10-CM | POA: Diagnosis not present

## 2017-04-23 DIAGNOSIS — K5900 Constipation, unspecified: Secondary | ICD-10-CM | POA: Diagnosis not present

## 2017-04-23 DIAGNOSIS — Z7901 Long term (current) use of anticoagulants: Secondary | ICD-10-CM | POA: Diagnosis not present

## 2017-04-23 DIAGNOSIS — D649 Anemia, unspecified: Secondary | ICD-10-CM | POA: Diagnosis not present

## 2017-04-23 DIAGNOSIS — H2513 Age-related nuclear cataract, bilateral: Secondary | ICD-10-CM | POA: Diagnosis not present

## 2017-04-23 DIAGNOSIS — R0989 Other specified symptoms and signs involving the circulatory and respiratory systems: Secondary | ICD-10-CM | POA: Diagnosis not present

## 2017-04-23 DIAGNOSIS — I10 Essential (primary) hypertension: Secondary | ICD-10-CM | POA: Diagnosis not present

## 2017-04-23 DIAGNOSIS — R262 Difficulty in walking, not elsewhere classified: Secondary | ICD-10-CM | POA: Diagnosis not present

## 2017-04-23 DIAGNOSIS — N3001 Acute cystitis with hematuria: Secondary | ICD-10-CM | POA: Diagnosis not present

## 2017-04-23 DIAGNOSIS — J181 Lobar pneumonia, unspecified organism: Secondary | ICD-10-CM | POA: Diagnosis not present

## 2017-04-23 DIAGNOSIS — R1312 Dysphagia, oropharyngeal phase: Secondary | ICD-10-CM | POA: Diagnosis not present

## 2017-04-23 DIAGNOSIS — E559 Vitamin D deficiency, unspecified: Secondary | ICD-10-CM | POA: Diagnosis not present

## 2017-04-23 DIAGNOSIS — E785 Hyperlipidemia, unspecified: Secondary | ICD-10-CM | POA: Diagnosis not present

## 2017-04-23 DIAGNOSIS — Z79899 Other long term (current) drug therapy: Secondary | ICD-10-CM | POA: Diagnosis not present

## 2017-04-23 DIAGNOSIS — M810 Age-related osteoporosis without current pathological fracture: Secondary | ICD-10-CM | POA: Diagnosis not present

## 2017-04-23 DIAGNOSIS — M1990 Unspecified osteoarthritis, unspecified site: Secondary | ICD-10-CM | POA: Diagnosis not present

## 2017-04-23 DIAGNOSIS — N390 Urinary tract infection, site not specified: Secondary | ICD-10-CM | POA: Diagnosis not present

## 2017-04-23 DIAGNOSIS — I4891 Unspecified atrial fibrillation: Secondary | ICD-10-CM | POA: Diagnosis not present

## 2017-04-23 DIAGNOSIS — M6281 Muscle weakness (generalized): Secondary | ICD-10-CM | POA: Diagnosis not present

## 2017-04-23 DIAGNOSIS — R05 Cough: Secondary | ICD-10-CM | POA: Diagnosis not present

## 2017-04-23 DIAGNOSIS — S8001XA Contusion of right knee, initial encounter: Secondary | ICD-10-CM | POA: Diagnosis not present

## 2017-04-23 DIAGNOSIS — E039 Hypothyroidism, unspecified: Secondary | ICD-10-CM | POA: Diagnosis not present

## 2017-04-23 DIAGNOSIS — E871 Hypo-osmolality and hyponatremia: Secondary | ICD-10-CM | POA: Diagnosis not present

## 2017-04-23 DIAGNOSIS — J309 Allergic rhinitis, unspecified: Secondary | ICD-10-CM | POA: Diagnosis not present

## 2017-04-23 DIAGNOSIS — R001 Bradycardia, unspecified: Secondary | ICD-10-CM | POA: Diagnosis not present

## 2017-04-23 MED ORDER — HYDRALAZINE HCL 25 MG PO TABS
25.0000 mg | ORAL_TABLET | Freq: Once | ORAL | Status: AC
  Administered 2017-04-23: 25 mg via ORAL
  Filled 2017-04-23: qty 1

## 2017-04-23 MED ORDER — CEPHALEXIN 500 MG PO CAPS: 500.0000 mg | ORAL_CAPSULE | Freq: Two times a day (BID) | ORAL | 0 refills | Status: DC

## 2017-04-23 NOTE — Progress Notes (Addendum)
Patient returning to [facility].  CSW confirmed with Care Management-Terri at facility. Patient bed is still available.  [facility] called for transport. Daughter informed.  Med. NES. Complete. DNR Placed.  Nurse given number to call report.   Kathrin Greathouse, Latanya Presser, MSW Clinical Social Worker  [phone] 04/23/2017  2:37 PM

## 2017-04-23 NOTE — Care Management Note (Signed)
Case Management Note  Patient Details  Name: Katherine Miranda MRN: [id number] Date of Birth: [birth year]  Subjective/Objective:83 y/o f admitted w/Encephalopathy. From SNF.d/c back SNF. CSW already following.                    Action/Plan:d/c SNF-[facility]   Expected Discharge Date:  04/23/17               Expected Discharge Plan:  Skilled Nursing Facility  In-House Referral:  Clinical Social Work  Discharge planning Services  CM Consult  Post Acute Care Choice:    Choice offered to:     DME Arranged:    DME Agency:     HH Arranged:    [address]-Road Agency:     Status of Service:  Completed, signed off  If discussed at [employer], dates discussed:    Additional Comments:  Dessa Phi, RN 04/23/2017, 11:42 AM

## 2017-04-23 NOTE — Progress Notes (Addendum)
PTAR arrived to unit to assist patient with transport. BP assessed and result 204/88. BP assessed in both arms. Daughter upset regarding result,  health history HTN reviewed. Provider contacted with update. Hydralazine 25 mg po ordered received, goal SBP less than 180.Marland Kitchen Daughter made aware that dose would be given and BP will be rechecked. Will update provider with BP results

## 2017-04-23 NOTE — Progress Notes (Signed)
No change in pt mentation from assessment. Daughter is at bedside. Plan of care discussed with daughter. Pt daughter request that depend brief be placed on pt for transfer to facility. Report called to Beth,RN. Questions concerns denied. Per SW [facility] has been contacted for transport.

## 2017-04-23 NOTE — Discharge Summary (Signed)
Physician Discharge Summary  [name]:[id number] DOB: 10-15-1920 DOA: 04/20/2017  PCP: [name], MD  Admit date: 04/20/2017 Discharge date: 04/23/2017  Admitted From: [facility] Disposition: [place]  Recommendations for Outpatient Follow-up:  1. Follow up with PCP in 1-2 weeks 2. Continue keflex as below, consider suppressive antibiotics given frequent UTI requiring hospitalization.  Home Health: N/A Equipment/Devices: None new Discharge Condition: Stable CODE STATUS: DNR Diet recommendation: Dysphagia diet, or as recommended by SLP.   Brief/Interim Summary: Katherine Miranda a 83 y.o.femalewith medical history significant ofmoderate to severe dementia, wheelchair and bedbound,who presents with altered mentation over last 48 hours, associated with elevated blood pressure and progressive weakness.  She had had similar symptoms several days PTAthat were self limited,but stillpromptedaurine culture which resulted in greater than 100,000 colony-forming units,of unspecified bacteria.She was started on ciprofloxacin 24 hours PTA. Patient was found encephalopathic.Urinalysis showed TNTC WBCs. Head CT nonacute. She received IV fluids and IV antibiotics,referred for admission and further evaluation.Ceftriaxone was continued without further unresponsive episodes, though she displayed sundowning delirium intermittently. She had brief acute urinary retention that resolved on voiding trial 11/22. Antibiotics will be continued and she will be discharged.   Discharge Diagnoses:  Active Problems:   Encephalopathy  Metabolicencephalopathy due to urinary tract infection: Has hx E. coli UTI sensitive to CTX. Urine culture no growth (suspect due to having PTA abx).  - Convert ceftriaxone to keflex 500mg  BID x5 more days as below. May consider suppressive chronic abx, e.g. trimethoprim, following this course. - Monitor blood cultures, NGTD for >2 days  at discharge.  Acute delirium on chronic dementia:  - Delirium precautions were provided, anticipate improvement with discharge back to familiar setting.  - Minimized restraints  Acute urinary retention: ?Due to UTI. Resolved.  History of aspiration pneumonia:  - Aspiration precautions, dysphagia diet per SLP last hospitalization.  Chronic atrial fibrillation.Currently rate controlled - Continue anticoagulation withapixaban, given advanced age and preexisting severe dementia, would consider decreasing dose of eliquis, though weight and creatinine are above threshold for dose reduction.  Hypothyroidism: Last TSH wnl. - Continue levothyroxine.  Discharge Instructions  Allergies as of 04/23/2017   No Known Allergies     Medication List    STOP taking these medications   ciprofloxacin 250 MG tablet Commonly known as:  CIPRO     TAKE these medications   cephALEXin 500 MG capsule Commonly known as:  KEFLEX Take 1 capsule (500 mg total) by mouth 2 (two) times daily.   cholecalciferol 1000 units tablet Commonly known as:  VITAMIN D Take 1,000 Units daily by mouth.   ELIQUIS 5 MG Tabs tablet Generic drug:  apixaban Take 5 mg 2 (two) times daily by mouth. What changed:  Another medication with the same name was removed. Continue taking this medication, and follow the directions you see here.   guaiFENesin 600 MG 12 hr tablet Commonly known as:  MUCINEX Take 600 mg by mouth 2 (two) times daily as needed for cough or to loosen phlegm.   RISA-BID PROBIOTIC Tabs Take 1 tablet daily by mouth.   senna 8.6 MG Tabs tablet Commonly known as:  SENOKOT Take 1 tablet daily as needed by mouth for mild constipation.   SYNTHROID 50 MCG tablet Generic drug:  levothyroxine Take 50 mcg daily by mouth. What changed:  Another medication with the same name was removed. Continue taking this medication, and follow the directions you see here.   Travoprost (BAK Free) 0.004 % Soln  ophthalmic solution  Commonly known as:  TRAVATAN Place 1 drop at bedtime into both eyes.      Follow-up Information    [name], MD Follow up.   Specialty:  Family Medicine Contact information: [address] [place] [address] [phone]          No Known Allergies  Consultations:  None  Procedures/Studies: Dg Chest 2 View  Result Date: 04/20/2017 CLINICAL DATA:  Altered mental status. EXAM: CHEST  2 VIEW COMPARISON:  Chest x-ray dated August 27, 2016. FINDINGS: Stable cardiomegaly. Normal pulmonary vascularity. Atherosclerotic calcification of the aortic arch. No focal consolidation, pleural effusion, or pneumothorax. No acute osseous abnormality. IMPRESSION: Stable cardiomegaly.  No active cardiopulmonary disease. Electronically Signed   By: Titus Dubin M.D.   On: 04/20/2017 11:38   Ct Head Wo Contrast  Result Date: 04/20/2017 CLINICAL DATA:  Unresponsive this morning EXAM: CT HEAD WITHOUT CONTRAST TECHNIQUE: Contiguous axial images were obtained from the base of the skull through the vertex without intravenous contrast. COMPARISON:  06/29/2016 FINDINGS: Brain: Global atrophy. Extensive chronic ischemic changes in the periventricular white matter. No mass effect, midline shift, or acute hemorrhage. Vascular: No hyperdense vessel or unexpected calcification. Skull: Cranium is intact. Sinuses/Orbits: No acute finding. Other: None. IMPRESSION: Atrophy and chronic ischemic changes. Electronically Signed   By: Marybelle Killings M.D.   On: 04/20/2017 11:02    Foley urinary catheter 11/21-11/22  Subjective: No complaints, less agitation overnight per staff. Ate breakfast without issues when fed by nurse tech. No fevers. Urinating well after pulling foley. No unresponsive episodes.  Discharge Exam: Vitals:   04/22/17 2104 04/23/17 0600  BP: (!) 156/96 (!) 158/86  Pulse: 67 (!) 53  Resp: 20 18  Temp: 97.9 F (36.6 C) 97.8 F (36.6 C)  SpO2: 95% 94%    General: Pt is alert, awake, not in acute distress Cardiovascular: RRR, S1/S2 +, no rubs, no gallops Respiratory: CTA bilaterally, no wheezing, no rhonchi Abdominal: Soft, NT, ND, bowel sounds + Extremities: No edema, no cyanosis Neuro: No focal deficits.  Labs: BNP (last 3 results) No results for input(s): BNP in the last 8760 hours. Basic Metabolic Panel: Recent Labs  Lab 04/20/17 1207 04/21/17 0433 04/22/17 0444  NA 131* 133* 138  K 5.4* 3.6 3.6  CL 93* 99* 103  CO2 28 28 26   GLUCOSE 139* 131* 107*  BUN 15 21* 14  CREATININE 1.00 1.21* 0.85  CALCIUM 9.1 8.5* 8.7*   Liver Function Tests: Recent Labs  Lab 04/20/17 1207 04/21/17 0433  AST 30 21  ALT 20 16  ALKPHOS 104 81  BILITOT 1.5* 0.9  PROT 7.8 6.1*  ALBUMIN 3.9 3.1*   No results for input(s): LIPASE, AMYLASE in the last 168 hours. No results for input(s): AMMONIA in the last 168 hours. CBC: Recent Labs  Lab 04/20/17 1207 04/21/17 0433  WBC 10.0 6.7  HGB 14.6 12.1  HCT 42.1 36.3  MCV 84.7 85.6  PLT 250 207   Cardiac Enzymes: No results for input(s): CKTOTAL, CKMB, CKMBINDEX, TROPONINI in the last 168 hours. BNP: Invalid input(s): POCBNP CBG: No results for input(s): GLUCAP in the last 168 hours. D-Dimer No results for input(s): DDIMER in the last 72 hours. Hgb A1c No results for input(s): HGBA1C in the last 72 hours. Lipid Profile No results for input(s): CHOL, HDL, LDLCALC, TRIG, CHOLHDL, LDLDIRECT in the last 72 hours. Thyroid function studies No results for input(s): TSH, T4TOTAL, T3FREE, THYROIDAB in the last 72 hours.  Invalid input(s): FREET3 Anemia work up No results for input(s): VITAMINB12, FOLATE, FERRITIN, TIBC, IRON, RETICCTPCT in the last 72 hours. Urinalysis    Component Value Date/Time   COLORURINE YELLOW 04/20/2017 1125   APPEARANCEUR CLOUDY (A) 04/20/2017 1125   LABSPEC 1.009 04/20/2017 1125   PHURINE 7.0 04/20/2017 1125   GLUCOSEU NEGATIVE 04/20/2017 1125   HGBUR  SMALL (A) 04/20/2017 1125   BILIRUBINUR NEGATIVE 04/20/2017 1125   KETONESUR NEGATIVE 04/20/2017 1125   PROTEINUR 100 (A) 04/20/2017 1125   UROBILINOGEN 0.2 04/15/2015 1509   NITRITE NEGATIVE 04/20/2017 1125   LEUKOCYTESUR LARGE (A) 04/20/2017 1125    Microbiology Recent Results (from the past 240 hour(s))  Urine culture     Status: None   Collection Time: 04/20/17 11:25 AM  Result Value Ref Range Status   Specimen Description URINE, CATHETERIZED  Final   Special Requests NONE  Final   Culture   Final    NO GROWTH Performed at [facility], [place] [address] [place], [place] [address]    Report Status 04/21/2017 FINAL  Final  Blood culture (routine x 2)     Status: None (Preliminary result)   Collection Time: 04/20/17 12:07 PM  Result Value Ref Range Status   Specimen Description BLOOD LEFT ANTECUBITAL  Final   Special Requests   Final    Blood Culture results may not be optimal due to an inadequate volume of blood received in culture bottles   Culture   Final    NO GROWTH 2 DAYS Performed at [facility] [address], [place], [place] [address]    Report Status PENDING  Incomplete  Blood culture (routine x 2)     Status: None (Preliminary result)   Collection Time: 04/20/17 12:21 PM  Result Value Ref Range Status   Specimen Description BLOOD RIGHT ANTECUBITAL  Final   Special Requests   Final    BOTTLES DRAWN AEROBIC AND ANAEROBIC Blood Culture results may not be optimal due to an inadequate volume of blood received in culture bottles   Culture   Final    NO GROWTH 2 DAYS Performed at [facility], [place] [address] [place],  [address]    Report Status PENDING  Incomplete  MRSA PCR Screening     Status: Abnormal   Collection Time: 04/20/17  5:13 PM  Result Value Ref Range Status   MRSA by PCR POSITIVE (A) NEGATIVE Final    Comment:        The GeneXpert MRSA Assay (FDA approved for NASAL specimens only), is one component of a comprehensive  MRSA colonization surveillance program. It is not intended to diagnose MRSA infection nor to guide or monitor treatment for MRSA infections. RESULT CALLED TO, READ BACK BY AND VERIFIED WITH: Lise Auer [id number] @ [place]     Time coordinating discharge: Approximately 40 minutes  Vance Gather, MD  [facility] 04/23/2017, 9:26 AM Pager [phone]

## 2017-04-23 NOTE — NC FL2 (Signed)
[place] LEVEL OF CARE SCREENING TOOL     IDENTIFICATION  Patient Name: Katherine Miranda Birthdate: 03-Dec-1920 Sex: female Admission Date (Current Location): 04/20/2017  [facility] [place] Number:  Herbalist and Address:  [facility],  [place], [place]      Provider Number: [id number]  Attending Physician Name and Address:  Patrecia Pour, MD  Relative Name and Phone Number:       Current Level of Care: Hospital Recommended Level of Care: [place] Prior Approval Number:    Date Approved/Denied:   PASRR Number: [id number]  Discharge Plan: SNF    Current Diagnoses: Patient Active Problem List   Diagnosis Date Noted  . Encephalopathy 04/20/2017  . Community acquired pneumonia of right middle lobe of lung ([place]) 08/28/2016  . CKD (chronic kidney disease), stage III ([place]) 08/28/2016  . Normocytic anemia 08/28/2016  . UTI (urinary tract infection) 09/19/2015  . Bilateral pneumonia 09/18/2015  . Delirium 09/18/2015  . Dysphagia 07/17/2015  . UTI (lower urinary tract infection) 07/15/2015  . Hypothyroidism (acquired)   . Symptomatic bradycardia 04/20/2015  . Essential hypertension 09/19/2013  . Atrial fibrillation, chronic ([place]) 01/03/2013  . Subdural hygroma 10/06/2012  . Fall 10/06/2012  . Dementia 10/06/2012  . Hyponatremia 10/06/2012  . Dyslipidemia 10/06/2012    Orientation RESPIRATION BLADDER Height & Weight     Self  Normal incontinent, External catheter Weight: 151 lb 7.3 oz (68.7 kg) Height:  5\' 2"  (157.5 cm)  BEHAVIORAL SYMPTOMS/MOOD NEUROLOGICAL BOWEL NUTRITION STATUS      incontinent Diet(Dys. 1.)  AMBULATORY STATUS COMMUNICATION OF NEEDS Skin   Extensive Assist Verbally Normal                       Personal Care Assistance Level of Assistance  Bathing, Dressing, Feeding Bathing Assistance: Limited assistance Feeding assistance: Limited assistance  Dressing  Assistance: Limited assistance     Functional Limitations Info  Sight, Hearing, Speech Sight Info: Adequate Hearing Info: Adequate Speech Info: Adequate    SPECIAL CARE FACTORS FREQUENCY  PT (By licensed PT), OT (By licensed OT)     PT Frequency: 5x/week  OT Frequency: 5x/week            Contractures Contractures Info: Not present    Additional Factors Info  Code Status, Allergies Code Status Info: DNR Allergies Info: No Known Allergies           Current Medications (04/23/2017):  This is the current hospital active medication list Current Facility-Administered Medications  Medication Dose Route Frequency Provider Last Rate Last Dose  . acetaminophen (TYLENOL) tablet 650 mg  650 mg Oral Q6H PRN Arrien, Jimmy Picket, MD   650 mg at 04/23/17 0946   Or  . acetaminophen (TYLENOL) suppository 650 mg  650 mg Rectal Q6H PRN Arrien, Jimmy Picket, MD      . acidophilus (RISAQUAD) capsule 1 capsule  1 capsule Oral Daily Arrien, Jimmy Picket, MD   1 capsule at 04/23/17 0946  . apixaban (ELIQUIS) tablet 5 mg  5 mg Oral BID Arrien, Jimmy Picket, MD   5 mg at 04/23/17 0946  . cefTRIAXone (ROCEPHIN) 1 g in dextrose 5 % 50 mL IVPB  1 g Intravenous Q24H Arrien, Jimmy Picket, MD   Stopped at 04/22/17 1445  . Chlorhexidine Gluconate Cloth 2 % PADS 6 each  6 each Topical Q0600 Arrien, Jimmy Picket, MD   6 each at 04/23/17 [phone]  .  cholecalciferol (VITAMIN D) tablet 1,000 Units  1,000 Units Oral Daily Tawni Millers, MD   1,000 Units at 04/23/17 [phone]  . guaiFENesin (MUCINEX) 12 hr tablet 600 mg  600 mg Oral BID PRN Arrien, Jimmy Picket, MD      . latanoprost (XALATAN) 0.005 % ophthalmic solution 1 drop  1 drop Both Eyes QHS Arrien, Jimmy Picket, MD   1 drop at 04/22/17 2149  . levothyroxine (SYNTHROID, LEVOTHROID) tablet 50 mcg  50 mcg Oral QAC breakfast Arrien, Jimmy Picket, MD   50 mcg at 04/23/17 0830  . multivitamin with minerals tablet 1 tablet  1  tablet Oral Daily Arrien, Jimmy Picket, MD   1 tablet at 04/23/17 0946  . mupirocin ointment (BACTROBAN) 2 % 1 application  1 application Nasal BID Arrien, Jimmy Picket, MD   1 application at 11/24/92 0950  . ondansetron (ZOFRAN) tablet 4 mg  4 mg Oral Q6H PRN Arrien, Jimmy Picket, MD       Or  . ondansetron [facility]) injection 4 mg  4 mg Intravenous Q6H PRN Arrien, Jimmy Picket, MD      . senna [facility]) tablet 8.6 mg  1 tablet Oral Daily PRN Arrien, Jimmy Picket, MD      . thiamine (VITAMIN B-1) tablet 100 mg  100 mg Oral Daily Arrien, Jimmy Picket, MD   100 mg at 04/23/17 8546     Discharge Medications: Please see discharge summary for a list of discharge medications.  Relevant Imaging Results:  Relevant Lab Results:   Additional Information SSN: [id number].  Lia Hopping, LCSW

## 2017-04-25 LAB — CULTURE, BLOOD (ROUTINE X 2)
Culture: NO GROWTH
Culture: NO GROWTH

## 2017-05-04 DIAGNOSIS — I10 Essential (primary) hypertension: Secondary | ICD-10-CM | POA: Diagnosis not present

## 2017-05-04 DIAGNOSIS — F0390 Unspecified dementia without behavioral disturbance: Secondary | ICD-10-CM | POA: Diagnosis not present

## 2017-05-04 DIAGNOSIS — E039 Hypothyroidism, unspecified: Secondary | ICD-10-CM | POA: Diagnosis not present

## 2017-05-04 DIAGNOSIS — M6281 Muscle weakness (generalized): Secondary | ICD-10-CM | POA: Diagnosis not present

## 2017-05-04 DIAGNOSIS — R4181 Age-related cognitive decline: Secondary | ICD-10-CM | POA: Diagnosis not present

## 2017-05-04 DIAGNOSIS — Z7901 Long term (current) use of anticoagulants: Secondary | ICD-10-CM | POA: Diagnosis not present

## 2017-05-04 DIAGNOSIS — I4891 Unspecified atrial fibrillation: Secondary | ICD-10-CM | POA: Diagnosis not present

## 2017-05-04 DIAGNOSIS — N390 Urinary tract infection, site not specified: Secondary | ICD-10-CM | POA: Diagnosis not present

## 2017-06-02 DIAGNOSIS — E039 Hypothyroidism, unspecified: Secondary | ICD-10-CM | POA: Diagnosis not present

## 2017-06-02 DIAGNOSIS — I482 Chronic atrial fibrillation: Secondary | ICD-10-CM | POA: Diagnosis not present

## 2017-06-02 DIAGNOSIS — F0390 Unspecified dementia without behavioral disturbance: Secondary | ICD-10-CM | POA: Diagnosis not present

## 2017-06-02 DIAGNOSIS — R05 Cough: Secondary | ICD-10-CM | POA: Diagnosis not present

## 2017-07-08 DIAGNOSIS — M810 Age-related osteoporosis without current pathological fracture: Secondary | ICD-10-CM | POA: Diagnosis not present

## 2017-07-08 DIAGNOSIS — I482 Chronic atrial fibrillation: Secondary | ICD-10-CM | POA: Diagnosis not present

## 2017-07-08 DIAGNOSIS — M1990 Unspecified osteoarthritis, unspecified site: Secondary | ICD-10-CM | POA: Diagnosis not present

## 2017-07-08 DIAGNOSIS — F0390 Unspecified dementia without behavioral disturbance: Secondary | ICD-10-CM | POA: Diagnosis not present

## 2017-07-09 ENCOUNTER — Other Ambulatory Visit (HOSPITAL_COMMUNITY): Payer: Self-pay | Admitting: Internal Medicine

## 2017-07-09 DIAGNOSIS — R1310 Dysphagia, unspecified: Secondary | ICD-10-CM

## 2017-07-12 ENCOUNTER — Ambulatory Visit (HOSPITAL_COMMUNITY)
Admission: RE | Admit: 2017-07-12 | Discharge: 2017-07-12 | Disposition: A | Discharge status: Home / Self Care | Payer: Medicare Other | Source: Ambulatory Visit | Attending: Internal Medicine | Admitting: Internal Medicine

## 2017-07-12 DIAGNOSIS — R1310 Dysphagia, unspecified: Secondary | ICD-10-CM

## 2017-07-27 DIAGNOSIS — H349 Unspecified retinal vascular occlusion: Secondary | ICD-10-CM | POA: Diagnosis not present

## 2017-07-27 DIAGNOSIS — H2513 Age-related nuclear cataract, bilateral: Secondary | ICD-10-CM | POA: Diagnosis not present

## 2017-07-27 DIAGNOSIS — H401132 Primary open-angle glaucoma, bilateral, moderate stage: Secondary | ICD-10-CM | POA: Diagnosis not present

## 2017-07-29 DIAGNOSIS — R05 Cough: Secondary | ICD-10-CM | POA: Diagnosis not present

## 2017-08-02 DIAGNOSIS — N390 Urinary tract infection, site not specified: Secondary | ICD-10-CM | POA: Diagnosis not present

## 2017-08-02 DIAGNOSIS — F0390 Unspecified dementia without behavioral disturbance: Secondary | ICD-10-CM | POA: Diagnosis not present

## 2017-11-12 DIAGNOSIS — F0390 Unspecified dementia without behavioral disturbance: Secondary | ICD-10-CM | POA: Diagnosis not present

## 2017-11-12 DIAGNOSIS — M810 Age-related osteoporosis without current pathological fracture: Secondary | ICD-10-CM | POA: Diagnosis not present

## 2017-11-12 DIAGNOSIS — M1990 Unspecified osteoarthritis, unspecified site: Secondary | ICD-10-CM | POA: Diagnosis not present

## 2017-11-12 DIAGNOSIS — I482 Chronic atrial fibrillation: Secondary | ICD-10-CM | POA: Diagnosis not present

## 2018-01-30 DIAGNOSIS — A0472 Enterocolitis due to Clostridium difficile, not specified as recurrent: Secondary | ICD-10-CM | POA: Diagnosis not present

## 2018-01-30 DIAGNOSIS — D649 Anemia, unspecified: Secondary | ICD-10-CM | POA: Diagnosis not present

## 2018-01-30 DIAGNOSIS — H401132 Primary open-angle glaucoma, bilateral, moderate stage: Secondary | ICD-10-CM | POA: Diagnosis not present

## 2018-01-30 DIAGNOSIS — R05 Cough: Secondary | ICD-10-CM | POA: Diagnosis not present

## 2018-01-30 DIAGNOSIS — J111 Influenza due to unidentified influenza virus with other respiratory manifestations: Secondary | ICD-10-CM | POA: Diagnosis not present

## 2018-01-30 DIAGNOSIS — E559 Vitamin D deficiency, unspecified: Secondary | ICD-10-CM | POA: Diagnosis not present

## 2018-01-30 DIAGNOSIS — I129 Hypertensive chronic kidney disease with stage 1 through stage 4 chronic kidney disease, or unspecified chronic kidney disease: Secondary | ICD-10-CM | POA: Diagnosis not present

## 2018-01-30 DIAGNOSIS — N390 Urinary tract infection, site not specified: Secondary | ICD-10-CM | POA: Diagnosis not present

## 2018-01-30 DIAGNOSIS — R1312 Dysphagia, oropharyngeal phase: Secondary | ICD-10-CM | POA: Diagnosis not present

## 2018-01-30 DIAGNOSIS — E785 Hyperlipidemia, unspecified: Secondary | ICD-10-CM | POA: Diagnosis not present

## 2018-01-30 DIAGNOSIS — Z7901 Long term (current) use of anticoagulants: Secondary | ICD-10-CM | POA: Diagnosis not present

## 2018-01-30 DIAGNOSIS — J309 Allergic rhinitis, unspecified: Secondary | ICD-10-CM | POA: Diagnosis not present

## 2018-01-30 DIAGNOSIS — I482 Chronic atrial fibrillation: Secondary | ICD-10-CM | POA: Diagnosis not present

## 2018-01-30 DIAGNOSIS — N183 Chronic kidney disease, stage 3 (moderate): Secondary | ICD-10-CM | POA: Diagnosis not present

## 2018-01-30 DIAGNOSIS — M810 Age-related osteoporosis without current pathological fracture: Secondary | ICD-10-CM | POA: Diagnosis not present

## 2018-01-30 DIAGNOSIS — F0390 Unspecified dementia without behavioral disturbance: Secondary | ICD-10-CM | POA: Diagnosis not present

## 2018-01-30 DIAGNOSIS — R001 Bradycardia, unspecified: Secondary | ICD-10-CM | POA: Diagnosis not present

## 2018-01-30 DIAGNOSIS — K5900 Constipation, unspecified: Secondary | ICD-10-CM | POA: Diagnosis not present

## 2018-01-30 DIAGNOSIS — I4891 Unspecified atrial fibrillation: Secondary | ICD-10-CM | POA: Diagnosis not present

## 2018-01-30 DIAGNOSIS — E039 Hypothyroidism, unspecified: Secondary | ICD-10-CM | POA: Diagnosis not present

## 2018-01-30 DIAGNOSIS — E871 Hypo-osmolality and hyponatremia: Secondary | ICD-10-CM | POA: Diagnosis not present

## 2018-01-30 DIAGNOSIS — M1990 Unspecified osteoarthritis, unspecified site: Secondary | ICD-10-CM | POA: Diagnosis not present

## 2018-01-30 DIAGNOSIS — Z79899 Other long term (current) drug therapy: Secondary | ICD-10-CM | POA: Diagnosis not present

## 2018-02-24 DIAGNOSIS — I129 Hypertensive chronic kidney disease with stage 1 through stage 4 chronic kidney disease, or unspecified chronic kidney disease: Secondary | ICD-10-CM | POA: Diagnosis not present

## 2018-02-24 DIAGNOSIS — I482 Chronic atrial fibrillation: Secondary | ICD-10-CM | POA: Diagnosis not present

## 2018-02-24 DIAGNOSIS — F0390 Unspecified dementia without behavioral disturbance: Secondary | ICD-10-CM | POA: Diagnosis not present

## 2018-02-24 DIAGNOSIS — R1312 Dysphagia, oropharyngeal phase: Secondary | ICD-10-CM | POA: Diagnosis not present

## 2018-03-24 DIAGNOSIS — I129 Hypertensive chronic kidney disease with stage 1 through stage 4 chronic kidney disease, or unspecified chronic kidney disease: Secondary | ICD-10-CM | POA: Diagnosis not present

## 2018-03-24 DIAGNOSIS — R1312 Dysphagia, oropharyngeal phase: Secondary | ICD-10-CM | POA: Diagnosis not present

## 2018-03-24 DIAGNOSIS — F0390 Unspecified dementia without behavioral disturbance: Secondary | ICD-10-CM | POA: Diagnosis not present

## 2018-03-24 DIAGNOSIS — I4891 Unspecified atrial fibrillation: Secondary | ICD-10-CM | POA: Diagnosis not present

## 2018-04-08 DIAGNOSIS — H401132 Primary open-angle glaucoma, bilateral, moderate stage: Secondary | ICD-10-CM | POA: Diagnosis not present

## 2018-04-21 DIAGNOSIS — R1312 Dysphagia, oropharyngeal phase: Secondary | ICD-10-CM | POA: Diagnosis not present

## 2018-04-21 DIAGNOSIS — E785 Hyperlipidemia, unspecified: Secondary | ICD-10-CM | POA: Diagnosis not present

## 2018-04-21 DIAGNOSIS — J309 Allergic rhinitis, unspecified: Secondary | ICD-10-CM | POA: Diagnosis not present

## 2018-04-21 DIAGNOSIS — I129 Hypertensive chronic kidney disease with stage 1 through stage 4 chronic kidney disease, or unspecified chronic kidney disease: Secondary | ICD-10-CM | POA: Diagnosis not present

## 2018-04-29 DIAGNOSIS — F0390 Unspecified dementia without behavioral disturbance: Secondary | ICD-10-CM | POA: Diagnosis not present

## 2018-04-29 DIAGNOSIS — R58 Hemorrhage, not elsewhere classified: Secondary | ICD-10-CM | POA: Diagnosis not present

## 2018-04-29 DIAGNOSIS — I4891 Unspecified atrial fibrillation: Secondary | ICD-10-CM | POA: Diagnosis not present

## 2018-04-29 DIAGNOSIS — I129 Hypertensive chronic kidney disease with stage 1 through stage 4 chronic kidney disease, or unspecified chronic kidney disease: Secondary | ICD-10-CM | POA: Diagnosis not present

## 2018-05-10 DIAGNOSIS — L0390 Cellulitis, unspecified: Secondary | ICD-10-CM | POA: Diagnosis not present

## 2018-05-13 DIAGNOSIS — R278 Other lack of coordination: Secondary | ICD-10-CM | POA: Diagnosis not present

## 2018-05-13 DIAGNOSIS — I482 Chronic atrial fibrillation, unspecified: Secondary | ICD-10-CM | POA: Diagnosis not present

## 2018-05-17 DIAGNOSIS — R609 Edema, unspecified: Secondary | ICD-10-CM | POA: Diagnosis not present

## 2018-05-17 DIAGNOSIS — L0390 Cellulitis, unspecified: Secondary | ICD-10-CM | POA: Diagnosis not present

## 2018-05-19 DIAGNOSIS — L0390 Cellulitis, unspecified: Secondary | ICD-10-CM | POA: Diagnosis not present

## 2018-05-19 DIAGNOSIS — I4891 Unspecified atrial fibrillation: Secondary | ICD-10-CM | POA: Diagnosis not present

## 2018-05-19 DIAGNOSIS — F0390 Unspecified dementia without behavioral disturbance: Secondary | ICD-10-CM | POA: Diagnosis not present

## 2018-05-19 DIAGNOSIS — R609 Edema, unspecified: Secondary | ICD-10-CM | POA: Diagnosis not present

## 2018-05-27 DIAGNOSIS — R609 Edema, unspecified: Secondary | ICD-10-CM | POA: Diagnosis not present

## 2018-05-27 DIAGNOSIS — I4891 Unspecified atrial fibrillation: Secondary | ICD-10-CM | POA: Diagnosis not present

## 2018-05-27 DIAGNOSIS — I129 Hypertensive chronic kidney disease with stage 1 through stage 4 chronic kidney disease, or unspecified chronic kidney disease: Secondary | ICD-10-CM | POA: Diagnosis not present

## 2018-06-03 DIAGNOSIS — N760 Acute vaginitis: Secondary | ICD-10-CM | POA: Diagnosis not present

## 2018-06-16 DIAGNOSIS — I4891 Unspecified atrial fibrillation: Secondary | ICD-10-CM | POA: Diagnosis not present

## 2018-06-16 DIAGNOSIS — F0390 Unspecified dementia without behavioral disturbance: Secondary | ICD-10-CM | POA: Diagnosis not present

## 2018-06-16 DIAGNOSIS — E785 Hyperlipidemia, unspecified: Secondary | ICD-10-CM | POA: Diagnosis not present

## 2018-07-01 DIAGNOSIS — N760 Acute vaginitis: Secondary | ICD-10-CM | POA: Diagnosis not present

## 2018-07-01 DIAGNOSIS — R609 Edema, unspecified: Secondary | ICD-10-CM | POA: Diagnosis not present

## 2018-07-01 DIAGNOSIS — I4891 Unspecified atrial fibrillation: Secondary | ICD-10-CM | POA: Diagnosis not present

## 2018-07-14 DIAGNOSIS — I4891 Unspecified atrial fibrillation: Secondary | ICD-10-CM | POA: Diagnosis not present

## 2018-07-14 DIAGNOSIS — R609 Edema, unspecified: Secondary | ICD-10-CM | POA: Diagnosis not present

## 2018-07-14 DIAGNOSIS — F0390 Unspecified dementia without behavioral disturbance: Secondary | ICD-10-CM | POA: Diagnosis not present

## 2018-07-14 DIAGNOSIS — N760 Acute vaginitis: Secondary | ICD-10-CM | POA: Diagnosis not present

## 2018-08-05 DIAGNOSIS — K5900 Constipation, unspecified: Secondary | ICD-10-CM | POA: Diagnosis not present

## 2018-08-05 DIAGNOSIS — H409 Unspecified glaucoma: Secondary | ICD-10-CM | POA: Diagnosis not present

## 2018-08-05 DIAGNOSIS — I10 Essential (primary) hypertension: Secondary | ICD-10-CM | POA: Diagnosis not present

## 2018-08-05 DIAGNOSIS — I4891 Unspecified atrial fibrillation: Secondary | ICD-10-CM | POA: Diagnosis not present

## 2018-08-06 DIAGNOSIS — I482 Chronic atrial fibrillation, unspecified: Secondary | ICD-10-CM | POA: Diagnosis not present

## 2018-08-06 DIAGNOSIS — E039 Hypothyroidism, unspecified: Secondary | ICD-10-CM | POA: Diagnosis not present

## 2018-08-06 DIAGNOSIS — D649 Anemia, unspecified: Secondary | ICD-10-CM | POA: Diagnosis not present

## 2018-08-11 DIAGNOSIS — J309 Allergic rhinitis, unspecified: Secondary | ICD-10-CM | POA: Diagnosis not present

## 2018-08-11 DIAGNOSIS — E039 Hypothyroidism, unspecified: Secondary | ICD-10-CM | POA: Diagnosis not present

## 2018-08-11 DIAGNOSIS — N189 Chronic kidney disease, unspecified: Secondary | ICD-10-CM | POA: Diagnosis not present

## 2018-08-11 DIAGNOSIS — E559 Vitamin D deficiency, unspecified: Secondary | ICD-10-CM | POA: Diagnosis not present

## 2018-08-12 DIAGNOSIS — N189 Chronic kidney disease, unspecified: Secondary | ICD-10-CM | POA: Diagnosis not present

## 2018-08-12 DIAGNOSIS — R4182 Altered mental status, unspecified: Secondary | ICD-10-CM | POA: Diagnosis not present

## 2018-08-12 DIAGNOSIS — I10 Essential (primary) hypertension: Secondary | ICD-10-CM | POA: Diagnosis not present

## 2018-08-12 DIAGNOSIS — R400 Somnolence: Secondary | ICD-10-CM | POA: Diagnosis not present

## 2018-08-13 DIAGNOSIS — I129 Hypertensive chronic kidney disease with stage 1 through stage 4 chronic kidney disease, or unspecified chronic kidney disease: Secondary | ICD-10-CM | POA: Diagnosis not present

## 2018-08-13 DIAGNOSIS — R319 Hematuria, unspecified: Secondary | ICD-10-CM | POA: Diagnosis not present

## 2018-08-13 DIAGNOSIS — N390 Urinary tract infection, site not specified: Secondary | ICD-10-CM | POA: Diagnosis not present

## 2018-08-13 DIAGNOSIS — Z79899 Other long term (current) drug therapy: Secondary | ICD-10-CM | POA: Diagnosis not present

## 2018-08-13 DIAGNOSIS — D649 Anemia, unspecified: Secondary | ICD-10-CM | POA: Diagnosis not present

## 2018-08-23 DIAGNOSIS — D649 Anemia, unspecified: Secondary | ICD-10-CM | POA: Diagnosis not present

## 2018-08-23 DIAGNOSIS — I739 Peripheral vascular disease, unspecified: Secondary | ICD-10-CM | POA: Diagnosis not present

## 2018-08-23 DIAGNOSIS — Z7901 Long term (current) use of anticoagulants: Secondary | ICD-10-CM | POA: Diagnosis not present

## 2018-08-23 DIAGNOSIS — N19 Unspecified kidney failure: Secondary | ICD-10-CM | POA: Diagnosis not present

## 2018-08-23 DIAGNOSIS — I129 Hypertensive chronic kidney disease with stage 1 through stage 4 chronic kidney disease, or unspecified chronic kidney disease: Secondary | ICD-10-CM | POA: Diagnosis not present

## 2018-09-02 DIAGNOSIS — D649 Anemia, unspecified: Secondary | ICD-10-CM | POA: Diagnosis not present

## 2018-09-02 DIAGNOSIS — I129 Hypertensive chronic kidney disease with stage 1 through stage 4 chronic kidney disease, or unspecified chronic kidney disease: Secondary | ICD-10-CM | POA: Diagnosis not present

## 2018-09-02 DIAGNOSIS — N183 Chronic kidney disease, stage 3 (moderate): Secondary | ICD-10-CM | POA: Diagnosis not present

## 2018-09-06 DIAGNOSIS — I10 Essential (primary) hypertension: Secondary | ICD-10-CM | POA: Diagnosis not present

## 2018-09-06 DIAGNOSIS — I4891 Unspecified atrial fibrillation: Secondary | ICD-10-CM | POA: Diagnosis not present

## 2018-09-06 DIAGNOSIS — E559 Vitamin D deficiency, unspecified: Secondary | ICD-10-CM | POA: Diagnosis not present

## 2018-09-06 DIAGNOSIS — N189 Chronic kidney disease, unspecified: Secondary | ICD-10-CM | POA: Diagnosis not present

## 2018-09-08 DIAGNOSIS — I10 Essential (primary) hypertension: Secondary | ICD-10-CM | POA: Diagnosis not present

## 2018-09-08 DIAGNOSIS — E559 Vitamin D deficiency, unspecified: Secondary | ICD-10-CM | POA: Diagnosis not present

## 2018-09-08 DIAGNOSIS — N189 Chronic kidney disease, unspecified: Secondary | ICD-10-CM | POA: Diagnosis not present

## 2018-09-08 DIAGNOSIS — I4891 Unspecified atrial fibrillation: Secondary | ICD-10-CM | POA: Diagnosis not present

## 2018-09-30 DIAGNOSIS — N189 Chronic kidney disease, unspecified: Secondary | ICD-10-CM | POA: Diagnosis not present

## 2018-09-30 DIAGNOSIS — I4891 Unspecified atrial fibrillation: Secondary | ICD-10-CM | POA: Diagnosis not present

## 2018-09-30 DIAGNOSIS — I10 Essential (primary) hypertension: Secondary | ICD-10-CM | POA: Diagnosis not present

## 2018-09-30 DIAGNOSIS — K5900 Constipation, unspecified: Secondary | ICD-10-CM | POA: Diagnosis not present

## 2018-10-06 DIAGNOSIS — I10 Essential (primary) hypertension: Secondary | ICD-10-CM | POA: Diagnosis not present

## 2018-10-06 DIAGNOSIS — N189 Chronic kidney disease, unspecified: Secondary | ICD-10-CM | POA: Diagnosis not present

## 2018-10-06 DIAGNOSIS — I4891 Unspecified atrial fibrillation: Secondary | ICD-10-CM | POA: Diagnosis not present

## 2018-10-06 DIAGNOSIS — E559 Vitamin D deficiency, unspecified: Secondary | ICD-10-CM | POA: Diagnosis not present

## 2018-10-11 DIAGNOSIS — I10 Essential (primary) hypertension: Secondary | ICD-10-CM | POA: Diagnosis not present

## 2018-10-11 DIAGNOSIS — I4891 Unspecified atrial fibrillation: Secondary | ICD-10-CM | POA: Diagnosis not present

## 2018-10-11 DIAGNOSIS — N189 Chronic kidney disease, unspecified: Secondary | ICD-10-CM | POA: Diagnosis not present

## 2018-10-11 DIAGNOSIS — E559 Vitamin D deficiency, unspecified: Secondary | ICD-10-CM | POA: Diagnosis not present

## 2018-10-28 DIAGNOSIS — N189 Chronic kidney disease, unspecified: Secondary | ICD-10-CM | POA: Diagnosis not present

## 2018-10-28 DIAGNOSIS — I4891 Unspecified atrial fibrillation: Secondary | ICD-10-CM | POA: Diagnosis not present

## 2018-10-28 DIAGNOSIS — I10 Essential (primary) hypertension: Secondary | ICD-10-CM | POA: Diagnosis not present

## 2018-10-28 DIAGNOSIS — E559 Vitamin D deficiency, unspecified: Secondary | ICD-10-CM | POA: Diagnosis not present

## 2018-11-03 DIAGNOSIS — I10 Essential (primary) hypertension: Secondary | ICD-10-CM | POA: Diagnosis not present

## 2018-11-03 DIAGNOSIS — N189 Chronic kidney disease, unspecified: Secondary | ICD-10-CM | POA: Diagnosis not present

## 2018-11-03 DIAGNOSIS — I4891 Unspecified atrial fibrillation: Secondary | ICD-10-CM | POA: Diagnosis not present

## 2018-11-03 DIAGNOSIS — E559 Vitamin D deficiency, unspecified: Secondary | ICD-10-CM | POA: Diagnosis not present

## 2018-11-08 DIAGNOSIS — I10 Essential (primary) hypertension: Secondary | ICD-10-CM | POA: Diagnosis not present

## 2018-11-08 DIAGNOSIS — H1130 Conjunctival hemorrhage, unspecified eye: Secondary | ICD-10-CM | POA: Diagnosis not present

## 2018-11-08 DIAGNOSIS — N189 Chronic kidney disease, unspecified: Secondary | ICD-10-CM | POA: Diagnosis not present

## 2018-11-08 DIAGNOSIS — I4891 Unspecified atrial fibrillation: Secondary | ICD-10-CM | POA: Diagnosis not present

## 2018-11-12 DIAGNOSIS — I482 Chronic atrial fibrillation, unspecified: Secondary | ICD-10-CM | POA: Diagnosis not present

## 2018-11-12 DIAGNOSIS — I129 Hypertensive chronic kidney disease with stage 1 through stage 4 chronic kidney disease, or unspecified chronic kidney disease: Secondary | ICD-10-CM | POA: Diagnosis not present

## 2018-11-12 DIAGNOSIS — D649 Anemia, unspecified: Secondary | ICD-10-CM | POA: Diagnosis not present

## 2018-11-18 DIAGNOSIS — D649 Anemia, unspecified: Secondary | ICD-10-CM | POA: Diagnosis not present

## 2018-11-18 DIAGNOSIS — N390 Urinary tract infection, site not specified: Secondary | ICD-10-CM | POA: Diagnosis not present

## 2018-11-19 DIAGNOSIS — N390 Urinary tract infection, site not specified: Secondary | ICD-10-CM | POA: Diagnosis not present

## 2018-11-19 DIAGNOSIS — R319 Hematuria, unspecified: Secondary | ICD-10-CM | POA: Diagnosis not present

## 2018-11-25 DIAGNOSIS — N189 Chronic kidney disease, unspecified: Secondary | ICD-10-CM | POA: Diagnosis not present

## 2018-11-25 DIAGNOSIS — I4891 Unspecified atrial fibrillation: Secondary | ICD-10-CM | POA: Diagnosis not present

## 2018-11-25 DIAGNOSIS — I10 Essential (primary) hypertension: Secondary | ICD-10-CM | POA: Diagnosis not present

## 2018-11-25 DIAGNOSIS — E559 Vitamin D deficiency, unspecified: Secondary | ICD-10-CM | POA: Diagnosis not present

## 2018-11-29 DIAGNOSIS — I4891 Unspecified atrial fibrillation: Secondary | ICD-10-CM | POA: Diagnosis not present

## 2018-11-29 DIAGNOSIS — J309 Allergic rhinitis, unspecified: Secondary | ICD-10-CM | POA: Diagnosis not present

## 2018-11-29 DIAGNOSIS — E559 Vitamin D deficiency, unspecified: Secondary | ICD-10-CM | POA: Diagnosis not present

## 2018-11-29 DIAGNOSIS — I10 Essential (primary) hypertension: Secondary | ICD-10-CM | POA: Diagnosis not present

## 2018-12-19 DIAGNOSIS — E559 Vitamin D deficiency, unspecified: Secondary | ICD-10-CM | POA: Diagnosis not present

## 2018-12-19 DIAGNOSIS — I4891 Unspecified atrial fibrillation: Secondary | ICD-10-CM | POA: Diagnosis not present

## 2018-12-19 DIAGNOSIS — I10 Essential (primary) hypertension: Secondary | ICD-10-CM | POA: Diagnosis not present

## 2018-12-19 DIAGNOSIS — N189 Chronic kidney disease, unspecified: Secondary | ICD-10-CM | POA: Diagnosis not present

## 2018-12-26 DIAGNOSIS — I129 Hypertensive chronic kidney disease with stage 1 through stage 4 chronic kidney disease, or unspecified chronic kidney disease: Secondary | ICD-10-CM | POA: Diagnosis not present

## 2018-12-26 DIAGNOSIS — I10 Essential (primary) hypertension: Secondary | ICD-10-CM | POA: Diagnosis not present

## 2018-12-29 DIAGNOSIS — E559 Vitamin D deficiency, unspecified: Secondary | ICD-10-CM | POA: Diagnosis not present

## 2018-12-29 DIAGNOSIS — I4891 Unspecified atrial fibrillation: Secondary | ICD-10-CM | POA: Diagnosis not present

## 2018-12-29 DIAGNOSIS — N189 Chronic kidney disease, unspecified: Secondary | ICD-10-CM | POA: Diagnosis not present

## 2018-12-29 DIAGNOSIS — I10 Essential (primary) hypertension: Secondary | ICD-10-CM | POA: Diagnosis not present

## 2019-01-18 DIAGNOSIS — I4891 Unspecified atrial fibrillation: Secondary | ICD-10-CM | POA: Diagnosis not present

## 2019-01-18 DIAGNOSIS — E559 Vitamin D deficiency, unspecified: Secondary | ICD-10-CM | POA: Diagnosis not present

## 2019-01-18 DIAGNOSIS — N189 Chronic kidney disease, unspecified: Secondary | ICD-10-CM | POA: Diagnosis not present

## 2019-01-18 DIAGNOSIS — I10 Essential (primary) hypertension: Secondary | ICD-10-CM | POA: Diagnosis not present

## 2019-01-24 DIAGNOSIS — I4891 Unspecified atrial fibrillation: Secondary | ICD-10-CM | POA: Diagnosis not present

## 2019-01-24 DIAGNOSIS — E559 Vitamin D deficiency, unspecified: Secondary | ICD-10-CM | POA: Diagnosis not present

## 2019-01-24 DIAGNOSIS — N189 Chronic kidney disease, unspecified: Secondary | ICD-10-CM | POA: Diagnosis not present

## 2019-01-24 DIAGNOSIS — I10 Essential (primary) hypertension: Secondary | ICD-10-CM | POA: Diagnosis not present

## 2019-01-30 DIAGNOSIS — I129 Hypertensive chronic kidney disease with stage 1 through stage 4 chronic kidney disease, or unspecified chronic kidney disease: Secondary | ICD-10-CM | POA: Diagnosis not present

## 2019-01-30 DIAGNOSIS — I10 Essential (primary) hypertension: Secondary | ICD-10-CM | POA: Diagnosis not present

## 2019-02-21 DIAGNOSIS — I129 Hypertensive chronic kidney disease with stage 1 through stage 4 chronic kidney disease, or unspecified chronic kidney disease: Secondary | ICD-10-CM | POA: Diagnosis not present

## 2019-02-21 DIAGNOSIS — I4891 Unspecified atrial fibrillation: Secondary | ICD-10-CM | POA: Diagnosis not present

## 2019-02-21 DIAGNOSIS — F0390 Unspecified dementia without behavioral disturbance: Secondary | ICD-10-CM | POA: Diagnosis not present

## 2019-02-21 DIAGNOSIS — I10 Essential (primary) hypertension: Secondary | ICD-10-CM | POA: Diagnosis not present

## 2019-02-23 DIAGNOSIS — I10 Essential (primary) hypertension: Secondary | ICD-10-CM | POA: Diagnosis not present

## 2019-02-23 DIAGNOSIS — I129 Hypertensive chronic kidney disease with stage 1 through stage 4 chronic kidney disease, or unspecified chronic kidney disease: Secondary | ICD-10-CM | POA: Diagnosis not present

## 2019-03-01 DIAGNOSIS — I10 Essential (primary) hypertension: Secondary | ICD-10-CM | POA: Diagnosis not present

## 2019-03-01 DIAGNOSIS — I4891 Unspecified atrial fibrillation: Secondary | ICD-10-CM | POA: Diagnosis not present

## 2019-03-01 DIAGNOSIS — I129 Hypertensive chronic kidney disease with stage 1 through stage 4 chronic kidney disease, or unspecified chronic kidney disease: Secondary | ICD-10-CM | POA: Diagnosis not present

## 2019-03-01 DIAGNOSIS — F0390 Unspecified dementia without behavioral disturbance: Secondary | ICD-10-CM | POA: Diagnosis not present

## 2019-03-02 DIAGNOSIS — U071 COVID-19: Secondary | ICD-10-CM | POA: Diagnosis not present

## 2019-03-23 DIAGNOSIS — I129 Hypertensive chronic kidney disease with stage 1 through stage 4 chronic kidney disease, or unspecified chronic kidney disease: Secondary | ICD-10-CM | POA: Diagnosis not present

## 2019-03-23 DIAGNOSIS — I4891 Unspecified atrial fibrillation: Secondary | ICD-10-CM | POA: Diagnosis not present

## 2019-03-23 DIAGNOSIS — E785 Hyperlipidemia, unspecified: Secondary | ICD-10-CM | POA: Diagnosis not present

## 2019-03-23 DIAGNOSIS — F0390 Unspecified dementia without behavioral disturbance: Secondary | ICD-10-CM | POA: Diagnosis not present

## 2019-03-24 DIAGNOSIS — I10 Essential (primary) hypertension: Secondary | ICD-10-CM | POA: Diagnosis not present

## 2019-03-24 DIAGNOSIS — I129 Hypertensive chronic kidney disease with stage 1 through stage 4 chronic kidney disease, or unspecified chronic kidney disease: Secondary | ICD-10-CM | POA: Diagnosis not present

## 2019-03-29 DIAGNOSIS — F0390 Unspecified dementia without behavioral disturbance: Secondary | ICD-10-CM | POA: Diagnosis not present

## 2019-03-29 DIAGNOSIS — E785 Hyperlipidemia, unspecified: Secondary | ICD-10-CM | POA: Diagnosis not present

## 2019-03-29 DIAGNOSIS — I129 Hypertensive chronic kidney disease with stage 1 through stage 4 chronic kidney disease, or unspecified chronic kidney disease: Secondary | ICD-10-CM | POA: Diagnosis not present

## 2019-03-29 DIAGNOSIS — I4891 Unspecified atrial fibrillation: Secondary | ICD-10-CM | POA: Diagnosis not present

## 2019-04-04 DIAGNOSIS — U071 COVID-19: Secondary | ICD-10-CM | POA: Diagnosis not present

## 2019-04-07 DIAGNOSIS — U071 COVID-19: Secondary | ICD-10-CM | POA: Diagnosis not present

## 2019-04-11 DIAGNOSIS — R1312 Dysphagia, oropharyngeal phase: Secondary | ICD-10-CM | POA: Diagnosis not present

## 2019-04-12 DIAGNOSIS — U071 COVID-19: Secondary | ICD-10-CM | POA: Diagnosis not present

## 2019-04-12 DIAGNOSIS — R1312 Dysphagia, oropharyngeal phase: Secondary | ICD-10-CM | POA: Diagnosis not present

## 2019-04-14 DIAGNOSIS — R1312 Dysphagia, oropharyngeal phase: Secondary | ICD-10-CM | POA: Diagnosis not present

## 2019-04-18 DIAGNOSIS — R1312 Dysphagia, oropharyngeal phase: Secondary | ICD-10-CM | POA: Diagnosis not present

## 2019-04-19 DIAGNOSIS — R509 Fever, unspecified: Secondary | ICD-10-CM | POA: Diagnosis not present

## 2019-04-19 DIAGNOSIS — U071 COVID-19: Secondary | ICD-10-CM | POA: Diagnosis not present

## 2019-04-19 DIAGNOSIS — N390 Urinary tract infection, site not specified: Secondary | ICD-10-CM | POA: Diagnosis not present

## 2019-04-19 DIAGNOSIS — Z79899 Other long term (current) drug therapy: Secondary | ICD-10-CM | POA: Diagnosis not present

## 2019-04-19 DIAGNOSIS — R319 Hematuria, unspecified: Secondary | ICD-10-CM | POA: Diagnosis not present

## 2019-04-19 DIAGNOSIS — R1312 Dysphagia, oropharyngeal phase: Secondary | ICD-10-CM | POA: Diagnosis not present

## 2019-04-20 DIAGNOSIS — I4891 Unspecified atrial fibrillation: Secondary | ICD-10-CM | POA: Diagnosis not present

## 2019-04-20 DIAGNOSIS — E785 Hyperlipidemia, unspecified: Secondary | ICD-10-CM | POA: Diagnosis not present

## 2019-04-20 DIAGNOSIS — R1312 Dysphagia, oropharyngeal phase: Secondary | ICD-10-CM | POA: Diagnosis not present

## 2019-04-20 DIAGNOSIS — I129 Hypertensive chronic kidney disease with stage 1 through stage 4 chronic kidney disease, or unspecified chronic kidney disease: Secondary | ICD-10-CM | POA: Diagnosis not present

## 2019-04-24 DIAGNOSIS — R1312 Dysphagia, oropharyngeal phase: Secondary | ICD-10-CM | POA: Diagnosis not present

## 2019-04-25 DIAGNOSIS — R638 Other symptoms and signs concerning food and fluid intake: Secondary | ICD-10-CM | POA: Diagnosis not present

## 2019-04-25 DIAGNOSIS — U071 COVID-19: Secondary | ICD-10-CM | POA: Diagnosis not present

## 2019-04-25 DIAGNOSIS — J189 Pneumonia, unspecified organism: Secondary | ICD-10-CM | POA: Diagnosis not present

## 2019-04-26 DIAGNOSIS — R1312 Dysphagia, oropharyngeal phase: Secondary | ICD-10-CM | POA: Diagnosis not present

## 2019-04-26 DIAGNOSIS — I129 Hypertensive chronic kidney disease with stage 1 through stage 4 chronic kidney disease, or unspecified chronic kidney disease: Secondary | ICD-10-CM | POA: Diagnosis not present

## 2019-04-26 DIAGNOSIS — I10 Essential (primary) hypertension: Secondary | ICD-10-CM | POA: Diagnosis not present

## 2019-04-28 DIAGNOSIS — R1312 Dysphagia, oropharyngeal phase: Secondary | ICD-10-CM | POA: Diagnosis not present

## 2019-04-30 DIAGNOSIS — R1312 Dysphagia, oropharyngeal phase: Secondary | ICD-10-CM | POA: Diagnosis not present

## 2019-05-01 DIAGNOSIS — R1312 Dysphagia, oropharyngeal phase: Secondary | ICD-10-CM | POA: Diagnosis not present

## 2019-05-02 DIAGNOSIS — U071 COVID-19: Secondary | ICD-10-CM | POA: Diagnosis not present

## 2019-05-05 DIAGNOSIS — N390 Urinary tract infection, site not specified: Secondary | ICD-10-CM | POA: Diagnosis not present

## 2019-05-05 DIAGNOSIS — N183 Chronic kidney disease, stage 3 unspecified: Secondary | ICD-10-CM | POA: Diagnosis not present

## 2019-05-05 DIAGNOSIS — R319 Hematuria, unspecified: Secondary | ICD-10-CM | POA: Diagnosis not present

## 2019-05-05 DIAGNOSIS — Z79899 Other long term (current) drug therapy: Secondary | ICD-10-CM | POA: Diagnosis not present

## 2019-05-05 DIAGNOSIS — D649 Anemia, unspecified: Secondary | ICD-10-CM | POA: Diagnosis not present

## 2019-05-05 DIAGNOSIS — E039 Hypothyroidism, unspecified: Secondary | ICD-10-CM | POA: Diagnosis not present

## 2019-05-05 DIAGNOSIS — E559 Vitamin D deficiency, unspecified: Secondary | ICD-10-CM | POA: Diagnosis not present

## 2019-05-05 DIAGNOSIS — E785 Hyperlipidemia, unspecified: Secondary | ICD-10-CM | POA: Diagnosis not present

## 2019-05-05 DIAGNOSIS — R5383 Other fatigue: Secondary | ICD-10-CM | POA: Diagnosis not present

## 2019-05-09 DIAGNOSIS — U071 COVID-19: Secondary | ICD-10-CM | POA: Diagnosis not present

## 2019-05-22 ENCOUNTER — Other Ambulatory Visit: Payer: Self-pay

## 2019-05-22 ENCOUNTER — Encounter (HOSPITAL_COMMUNITY): Payer: Self-pay | Admitting: Internal Medicine

## 2019-05-22 ENCOUNTER — Emergency Department (HOSPITAL_COMMUNITY): Payer: Medicare Other

## 2019-05-22 ENCOUNTER — Inpatient Hospital Stay (HOSPITAL_COMMUNITY)
Admission: EM | Admit: 2019-05-22 | Discharge: 2019-05-27 | DRG: 064 | Disposition: A | Discharge status: Hospice | Payer: Medicare Other | Source: Skilled Nursing Facility | Attending: Family Medicine | Admitting: Family Medicine

## 2019-05-22 DIAGNOSIS — E1122 Type 2 diabetes mellitus with diabetic chronic kidney disease: Secondary | ICD-10-CM | POA: Diagnosis present

## 2019-05-22 DIAGNOSIS — N183 Chronic kidney disease, stage 3 unspecified: Secondary | ICD-10-CM | POA: Diagnosis present

## 2019-05-22 DIAGNOSIS — I639 Cerebral infarction, unspecified: Secondary | ICD-10-CM | POA: Diagnosis present

## 2019-05-22 DIAGNOSIS — I160 Hypertensive urgency: Secondary | ICD-10-CM | POA: Diagnosis not present

## 2019-05-22 DIAGNOSIS — Z8701 Personal history of pneumonia (recurrent): Secondary | ICD-10-CM

## 2019-05-22 DIAGNOSIS — Z66 Do not resuscitate: Secondary | ICD-10-CM | POA: Diagnosis present

## 2019-05-22 DIAGNOSIS — I63422 Cerebral infarction due to embolism of left anterior cerebral artery: Secondary | ICD-10-CM | POA: Diagnosis not present

## 2019-05-22 DIAGNOSIS — Z79899 Other long term (current) drug therapy: Secondary | ICD-10-CM

## 2019-05-22 DIAGNOSIS — Z833 Family history of diabetes mellitus: Secondary | ICD-10-CM

## 2019-05-22 DIAGNOSIS — M1990 Unspecified osteoarthritis, unspecified site: Secondary | ICD-10-CM | POA: Diagnosis present

## 2019-05-22 DIAGNOSIS — Z515 Encounter for palliative care: Secondary | ICD-10-CM

## 2019-05-22 DIAGNOSIS — F05 Delirium due to known physiological condition: Secondary | ICD-10-CM | POA: Diagnosis present

## 2019-05-22 DIAGNOSIS — Z7901 Long term (current) use of anticoagulants: Secondary | ICD-10-CM

## 2019-05-22 DIAGNOSIS — E039 Hypothyroidism, unspecified: Secondary | ICD-10-CM | POA: Diagnosis present

## 2019-05-22 DIAGNOSIS — I482 Chronic atrial fibrillation, unspecified: Secondary | ICD-10-CM | POA: Diagnosis not present

## 2019-05-22 DIAGNOSIS — G9340 Encephalopathy, unspecified: Secondary | ICD-10-CM | POA: Diagnosis not present

## 2019-05-22 DIAGNOSIS — G9341 Metabolic encephalopathy: Secondary | ICD-10-CM | POA: Diagnosis present

## 2019-05-22 DIAGNOSIS — Z8249 Family history of ischemic heart disease and other diseases of the circulatory system: Secondary | ICD-10-CM

## 2019-05-22 DIAGNOSIS — I129 Hypertensive chronic kidney disease with stage 1 through stage 4 chronic kidney disease, or unspecified chronic kidney disease: Secondary | ICD-10-CM | POA: Diagnosis present

## 2019-05-22 DIAGNOSIS — Z7189 Other specified counseling: Secondary | ICD-10-CM

## 2019-05-22 DIAGNOSIS — F0390 Unspecified dementia without behavioral disturbance: Secondary | ICD-10-CM | POA: Diagnosis present

## 2019-05-22 DIAGNOSIS — Z820 Family history of epilepsy and other diseases of the nervous system: Secondary | ICD-10-CM

## 2019-05-22 DIAGNOSIS — Z20828 Contact with and (suspected) exposure to other viral communicable diseases: Secondary | ICD-10-CM | POA: Diagnosis present

## 2019-05-22 DIAGNOSIS — N179 Acute kidney failure, unspecified: Secondary | ICD-10-CM | POA: Diagnosis present

## 2019-05-22 DIAGNOSIS — Z79890 Hormone replacement therapy (postmenopausal): Secondary | ICD-10-CM

## 2019-05-22 DIAGNOSIS — Z87440 Personal history of urinary (tract) infections: Secondary | ICD-10-CM

## 2019-05-22 DIAGNOSIS — N390 Urinary tract infection, site not specified: Secondary | ICD-10-CM | POA: Diagnosis present

## 2019-05-22 DIAGNOSIS — Z993 Dependence on wheelchair: Secondary | ICD-10-CM

## 2019-05-22 LAB — POCT I-STAT EG7
Bicarbonate: 24.4 mmol/L (ref 20.0–28.0)
Calcium, Ion: 1.17 mmol/L (ref 1.15–1.40)
HCT: 41 % (ref 36.0–46.0)
Hemoglobin: 13.9 g/dL (ref 12.0–15.0)
O2 Saturation: 97 %
Potassium: 4.3 mmol/L (ref 3.5–5.1)
Sodium: 139 mmol/L (ref 135–145)
TCO2: 26 mmol/L (ref 22–32)
pCO2, Ven: 39.3 mmHg — ABNORMAL LOW (ref 44.0–60.0)
pH, Ven: 7.401 (ref 7.250–7.430)
pO2, Ven: 88 mmHg — ABNORMAL HIGH (ref 32.0–45.0)

## 2019-05-22 LAB — COMPREHENSIVE METABOLIC PANEL
ALT: 13 U/L (ref 0–44)
AST: 28 U/L (ref 15–41)
Albumin: 3.4 g/dL — ABNORMAL LOW (ref 3.5–5.0)
Alkaline Phosphatase: 110 U/L (ref 38–126)
Anion gap: 11 (ref 5–15)
BUN: 29 mg/dL — ABNORMAL HIGH (ref 8–23)
CO2: 23 mmol/L (ref 22–32)
Calcium: 9.2 mg/dL (ref 8.9–10.3)
Chloride: 105 mmol/L (ref 98–111)
Creatinine, Ser: 1.57 mg/dL — ABNORMAL HIGH (ref 0.44–1.00)
GFR calc Af Amer: 32 mL/min — ABNORMAL LOW (ref >60)
GFR calc non Af Amer: 28 mL/min — ABNORMAL LOW (ref >60)
Glucose, Bld: 142 mg/dL — ABNORMAL HIGH (ref 70–99)
Potassium: 4.9 mmol/L (ref 3.5–5.1)
Sodium: 139 mmol/L (ref 135–145)
Total Bilirubin: 1.2 mg/dL (ref 0.3–1.2)
Total Protein: 6.8 g/dL (ref 6.5–8.1)

## 2019-05-22 LAB — URINALYSIS, COMPLETE (UACMP) WITH MICROSCOPIC
Bilirubin Urine: NEGATIVE
Glucose, UA: NEGATIVE mg/dL
Hgb urine dipstick: NEGATIVE
Ketones, ur: 5 mg/dL — AB
Nitrite: NEGATIVE
Protein, ur: 100 mg/dL — AB
Specific Gravity, Urine: 1.019 (ref 1.005–1.030)
WBC, UA: >50 WBC/hpf — ABNORMAL HIGH (ref 0–5)
pH: 5 (ref 5.0–8.0)

## 2019-05-22 LAB — CBC WITH DIFFERENTIAL/PLATELET
Abs Immature Granulocytes: 0.18 10*3/uL — ABNORMAL HIGH (ref 0.00–0.07)
Basophils Absolute: 0 10*3/uL (ref 0.0–0.1)
Basophils Relative: 0 %
Eosinophils Absolute: 0.1 10*3/uL (ref 0.0–0.5)
Eosinophils Relative: 1 %
HCT: 43.7 % (ref 36.0–46.0)
Hemoglobin: 14 g/dL (ref 12.0–15.0)
Immature Granulocytes: 2 %
Lymphocytes Relative: 12 %
Lymphs Abs: 1.2 10*3/uL (ref 0.7–4.0)
MCH: 29.2 pg (ref 26.0–34.0)
MCHC: 32 g/dL (ref 30.0–36.0)
MCV: 91 fL (ref 80.0–100.0)
Monocytes Absolute: 0.8 10*3/uL (ref 0.1–1.0)
Monocytes Relative: 8 %
Neutro Abs: 7.6 10*3/uL (ref 1.7–7.7)
Neutrophils Relative %: 77 %
Platelets: 234 10*3/uL (ref 150–400)
RBC: 4.8 MIL/uL (ref 3.87–5.11)
RDW: 15.9 % — ABNORMAL HIGH (ref 11.5–15.5)
WBC: 9.8 10*3/uL (ref 4.0–10.5)
nRBC: 0 % (ref 0.0–0.2)

## 2019-05-22 LAB — RAPID URINE DRUG SCREEN, HOSP PERFORMED
Amphetamines: NOT DETECTED
Barbiturates: NOT DETECTED
Benzodiazepines: NOT DETECTED
Cocaine: NOT DETECTED
Opiates: NOT DETECTED
Tetrahydrocannabinol: NOT DETECTED

## 2019-05-22 LAB — CBG MONITORING, ED
Glucose-Capillary: 132 mg/dL — ABNORMAL HIGH (ref 70–99)
Glucose-Capillary: 112 mg/dL — ABNORMAL HIGH (ref 70–99)

## 2019-05-22 LAB — TSH: TSH: 2.553 u[IU]/mL (ref 0.350–4.500)

## 2019-05-22 LAB — AMMONIA: Ammonia: 24 umol/L (ref 9–35)

## 2019-05-22 LAB — LACTIC ACID, PLASMA: Lactic Acid, Venous: 1.7 mmol/L (ref 0.5–1.9)

## 2019-05-22 MED ORDER — ONDANSETRON HCL 4 MG PO TABS: 4.0000 mg | ORAL_TABLET | Freq: Four times a day (QID) | ORAL | Status: DC | PRN

## 2019-05-22 MED ORDER — SODIUM CHLORIDE 0.9 % IV SOLN
1.0000 g | Freq: Once | INTRAVENOUS | Status: AC
  Administered 2019-05-22: 1 g via INTRAVENOUS
  Filled 2019-05-22: qty 10

## 2019-05-22 MED ORDER — ACETAMINOPHEN 325 MG PO TABS: 650.0000 mg | ORAL_TABLET | Freq: Four times a day (QID) | ORAL | Status: DC | PRN

## 2019-05-22 MED ORDER — HEPARIN (PORCINE) 25000 UT/250ML-% IV SOLN
800.0000 [IU]/h | INTRAVENOUS | Status: DC
  Administered 2019-05-23: 800 [IU]/h via INTRAVENOUS
  Filled 2019-05-22: qty 250

## 2019-05-22 MED ORDER — LATANOPROST 0.005 % OP SOLN
1.0000 [drp] | Freq: Every day | OPHTHALMIC | Status: DC
  Administered 2019-05-23 – 2019-05-26 (×4): 1 [drp] via OPHTHALMIC
  Filled 2019-05-22 (×2): qty 2.5

## 2019-05-22 MED ORDER — LEVOTHYROXINE SODIUM 100 MCG/5ML IV SOLN
50.0000 ug | Freq: Every day | INTRAVENOUS | Status: DC
  Filled 2019-05-22: qty 5

## 2019-05-22 MED ORDER — DEXTROSE-NACL 5-0.9 % IV SOLN: INTRAVENOUS | Status: AC

## 2019-05-22 MED ORDER — HYDRALAZINE HCL 20 MG/ML IJ SOLN
10.0000 mg | INTRAMUSCULAR | Status: DC | PRN
  Administered 2019-05-23: 10 mg via INTRAVENOUS
  Filled 2019-05-22: qty 1

## 2019-05-22 MED ORDER — SODIUM CHLORIDE 0.9 % IV SOLN
1.0000 g | INTRAVENOUS | Status: DC
  Administered 2019-05-23 – 2019-05-24 (×2): 1 g via INTRAVENOUS
  Filled 2019-05-22: qty 10
  Filled 2019-05-22: qty 1

## 2019-05-22 MED ORDER — ONDANSETRON HCL 4 MG/2ML IJ SOLN: 4.0000 mg | Freq: Four times a day (QID) | INTRAMUSCULAR | Status: DC | PRN

## 2019-05-22 MED ORDER — LACTATED RINGERS IV BOLUS
1000.0000 mL | Freq: Once | INTRAVENOUS | Status: AC
  Administered 2019-05-22: 1000 mL via INTRAVENOUS

## 2019-05-22 MED ORDER — ACETAMINOPHEN 650 MG RE SUPP: 650.0000 mg | Freq: Four times a day (QID) | RECTAL | Status: DC | PRN

## 2019-05-22 NOTE — ED Triage Notes (Addendum)
Pt to ED via EMS from [facility] in [place]. Per facility pt last known well was 1pm- Pt has hx dementia, and is usually verbal, pt currently non verbal and only responding to painful stimuli. #20 LFA- no medication given by EMS, Last VS: 146/80, P86, RR 22, O2 98%, CBG 136, Temp 97.9 GCS 11. Skin tear noted to LLE, open and draining. Hx Afib, dementia- currently taking elequis.

## 2019-05-22 NOTE — ED Notes (Signed)
Joelene Millin, NP from [facility] called for update. Update given.

## 2019-05-22 NOTE — ED Notes (Signed)
Paged Dr. Hal Hope for this pt. To have KUB XRAY so she may go to MRI, per MRI

## 2019-05-22 NOTE — Progress Notes (Signed)
ANTICOAGULATION CONSULT NOTE - Initial Consult  Pharmacy Consult for Heparin (Apixaban on hold) Indication: atrial fibrillation  No Known Allergies  Vital Signs: Temp: 98.2 F (36.8 C) (12/21 1625) Temp Source: Oral (12/21 1625) BP: 187/93 (12/21 2300) Pulse Rate: 50 (12/21 2300)  Labs: Recent Labs    05/22/19 1652 05/22/19 1751  HGB 14.0 13.9  HCT 43.7 41.0  PLT 234  --   CREATININE 1.57*  --     CrCl cannot be calculated (Unknown ideal weight.).   Medical History: Past Medical History:  Diagnosis Date  . A-fib ([place])   . Arthritis   . Dementia ([place])   . Hypertension     Assessment: 85 y/o F from a nursing facility with altered mental status. On apixaban PTA for afib. It has been >12 hours since last dose, will start heparin now. CBC good. Noted renal dysfunction. Will likely be using aPTT to dose heparin for the next few days given apixaban influence on heparin levels.   Goal of Therapy:  Heparin level 0.3-0.7 units/ml aPTT 66-102 seconds Monitor platelets by anticoagulation protocol: Yes   Plan:  Start heparin drip at 800 units/hr 0900 Heparin level/aPTT Daily CBC/HL/aPTT Monitor for bleeding   Narda Bonds, PharmD, BCPS Clinical Pharmacist Phone: [phone]

## 2019-05-22 NOTE — ED Notes (Signed)
Patient transported to CT 

## 2019-05-22 NOTE — ED Provider Notes (Signed)
[facility] EMERGENCY DEPARTMENT Provider Note   CSN: [id number] Arrival date & time: 05/22/19  1617     History Chief Complaint  Patient presents with  . Altered Mental Status    Katherine Miranda is a 85 y.o. female.  HPI Katherine Miranda is a 85 y.o. female with a medical history of atrial fibrillation, dementia who presents to the ED for altered mental status.  She presents by EMS from facility for decreased responsiveness per staff.  Patient's daughter reports that she has been more lethargic over the past month and has been treated for UTI twice in the past month.  Patient is unable to provide history.  Staff deny any seizure-like activity, vomiting, diarrhea or respiratory symptoms.     Past Medical History:  Diagnosis Date  . A-fib ([place])   . Arthritis   . Dementia ([place])   . Hypertension     Patient Active Problem List   Diagnosis Date Noted  . DNR (do not resuscitate) 05/23/2019  . Acute embolic stroke ([place]) 123XX123  . Acute encephalopathy 05/22/2019  . Hypertensive urgency 05/22/2019  . Encephalopathy 04/20/2017  . Community acquired pneumonia of right middle lobe of lung 08/28/2016  . CKD (chronic kidney disease), stage III 08/28/2016  . Normocytic anemia 08/28/2016  . UTI (urinary tract infection) 09/19/2015  . Bilateral pneumonia 09/18/2015  . Delirium 09/18/2015  . Dysphagia 07/17/2015  . UTI (lower urinary tract infection) 07/15/2015  . Hypothyroidism (acquired)   . Symptomatic bradycardia 04/20/2015  . Essential hypertension 09/19/2013  . Atrial fibrillation, chronic ([place]) 01/03/2013  . Subdural hygroma 10/06/2012  . Fall 10/06/2012  . Dementia ([place]) 10/06/2012  . Hyponatremia 10/06/2012  . Dyslipidemia 10/06/2012    Past Surgical History:  Procedure Laterality Date  . BACK SURGERY    . CARDIAC CATHETERIZATION N/A 04/20/2015   Procedure: Temporary Pacemaker;  Surgeon: Lorretta Harp, MD;  Location: [facility];   Service: Cardiovascular;  Laterality: N/A;  . KNEE ARTHROSCOPY       OB History   No obstetric history on file.     Family History  Problem Relation Age of Onset  . Heart disease Father   . Diabetes Sister   . Dementia Brother     Social History   Tobacco Use  . Smoking status: Never Smoker  . Smokeless tobacco: Never Used  Substance Use Topics  . Alcohol use: No  . Drug use: No    Home Medications Prior to Admission medications   Medication Sig Start Date End Date Taking? Authorizing Provider  acetaminophen (TYLENOL) 500 MG tablet Take 500 mg by mouth every 4 (four) hours as needed (pain).   Yes [provider]  apixaban (ELIQUIS) 5 MG TABS tablet Take 5 mg by mouth 2 (two) times daily.   Yes [provider]  ascorbic acid (VITAMIN C) 500 MG tablet Take 1,000 mg by mouth daily. COVIDi prophylaxis   Yes [provider]  b complex vitamins tablet Take 1 tablet by mouth daily.   Yes [provider]  cholecalciferol (VITAMIN D) 1000 units tablet Take 1,000 Units daily by mouth.   Yes [provider]  cloNIDine (CATAPRES) 0.1 MG tablet Take 0.1 mg by mouth every 8 (eight) hours as needed (SBP >170).   Yes [provider]  diltiazem (CARTIA XT) 120 MG 24 hr capsule Take 120 mg by mouth every evening.   Yes [provider]  ivermectin (STROMECTOL) 3 MG TABS tablet  Take 1.5 mg by mouth every Friday. For COVID prophylaxis   Yes [provider]  levothyroxine (SYNTHROID) 50 MCG tablet Take 50 mcg by mouth daily before breakfast.   Yes [provider]  lisinopril (ZESTRIL) 10 MG tablet Take 10 mg by mouth daily.   Yes [provider]  loratadine (CLARITIN) 10 MG tablet Take 10 mg by mouth daily as needed for allergies.   Yes [provider]  nitroGLYCERIN (NITROSTAT) 0.4 MG SL tablet Place 0.4 mg under the tongue every 5 (five) minutes as needed for chest pain (if no relief in 15 minutes  call MD/911).   Yes [provider]  Probiotic Product (RISA-BID PROBIOTIC) TABS Take 1 tablet daily by mouth.   Yes [provider]  senna (SENOKOT) 8.6 MG TABS tablet Take 1 tablet by mouth daily as needed (constipation).    Yes [provider]  Travoprost, BAK Free, (TRAVATAN) 0.004 % SOLN ophthalmic solution Place 1 drop at bedtime into both eyes.   Yes [provider]  Zinc 50 MG TABS Take 50 mg by mouth daily.   Yes [provider]    Allergies    Patient has no known allergies.  Review of Systems   Review of Systems  Unable to perform ROS: Mental status change  Constitutional: Negative for fever.  Gastrointestinal: Negative for abdominal pain and vomiting.  Genitourinary: Negative for hematuria.  Skin: Negative for color change and rash.  Neurological: Negative for seizures and syncope.  Psychiatric/Behavioral: Positive for confusion.  All other systems reviewed and are negative.   Physical Exam Updated Vital Signs BP (!) 144/80 (BP Location: Left Arm)   Pulse 83   Temp 97.8 F (36.6 C) (Oral)   Resp 17   SpO2 98%   Physical Exam Vitals and nursing note reviewed.  Constitutional:      General: She is not in acute distress.    Appearance: Normal appearance. She is well-developed. She is ill-appearing.     Comments: Patient responds to painful stimuli, she is awake, is nonverbal  HENT:     Head: Normocephalic and atraumatic.     Right Ear: External ear normal.     Left Ear: External ear normal.     Nose: Nose normal. No rhinorrhea.     Mouth/Throat:     Mouth: Mucous membranes are moist.  Eyes:     General:        Right eye: No discharge.        Left eye: No discharge.     Conjunctiva/sclera: Conjunctivae normal.  Cardiovascular:     Rate and Rhythm: Normal rate and regular rhythm.     Pulses: Normal pulses.     Heart sounds: Normal heart sounds. No murmur.  Pulmonary:     Effort: Pulmonary effort is normal. No  respiratory distress.     Breath sounds: Normal breath sounds. No wheezing or rales.  Abdominal:     General: Abdomen is flat. There is no distension.     Palpations: Abdomen is soft.     Tenderness: There is no abdominal tenderness.  Musculoskeletal:        General: No deformity or signs of injury. Normal range of motion.     Cervical back: Normal range of motion and neck supple.  Skin:    General: Skin is warm and dry.     Capillary Refill: Capillary refill takes less than 2 seconds.     Coloration: Skin is not jaundiced.  Neurological:     General: No focal deficit present.     Mental Status: She is alert.     Comments: Moves all extremities equally and spontaneously Awake, responds to painful stimuli but is nonverbal  Psychiatric:        Mood and Affect: Mood normal.        Behavior: Behavior normal.     ED Results / Procedures / Treatments   Labs (all labs ordered are listed, but only abnormal results are displayed) Labs Reviewed  COMPREHENSIVE METABOLIC PANEL - Abnormal; Notable for the following components:      Result Value   Glucose, Bld 142 (*)    BUN 29 (*)    Creatinine, Ser 1.57 (*)    Albumin 3.4 (*)    GFR calc non Af Amer 28 (*)    GFR calc Af Amer 32 (*)    All other components within normal limits  CBC WITH DIFFERENTIAL/PLATELET - Abnormal; Notable for the following components:   RDW 15.9 (*)    Abs Immature Granulocytes 0.18 (*)    All other components within normal limits  URINALYSIS, COMPLETE (UACMP) WITH MICROSCOPIC - Abnormal; Notable for the following components:   APPearance TURBID (*)    Ketones, ur 5 (*)    Protein, ur 100 (*)    Leukocytes,Ua MODERATE (*)    WBC, UA >50 (*)    Bacteria, UA MANY (*)    Non Squamous Epithelial 0-5 (*)    All other components within normal limits  BASIC METABOLIC PANEL - Abnormal; Notable for the following components:   Glucose, Bld 134 (*)    BUN 28 (*)    Creatinine, Ser 1.22 (*)    GFR calc non Af Amer  37 (*)    GFR calc Af Amer 43 (*)    All other components within normal limits  CBC - Abnormal; Notable for the following components:   RDW 15.9 (*)    All other components within normal limits  HEPARIN LEVEL (UNFRACTIONATED) - Abnormal; Notable for the following components:   Heparin Unfractionated >2.20 (*)    All other components within normal limits  GLUCOSE, CAPILLARY - Abnormal; Notable for the following components:   Glucose-Capillary 118 (*)    All other components within normal limits  BASIC METABOLIC PANEL - Abnormal; Notable for the following components:   Glucose, Bld 133 (*)    BUN 26 (*)    Creatinine, Ser 1.17 (*)    GFR calc non Af Amer 39 (*)    GFR calc Af Amer 46 (*)    All other components within normal limits  GLUCOSE, CAPILLARY - Abnormal; Notable for the following components:   Glucose-Capillary 111 (*)    All other components within normal limits  APTT - Abnormal; Notable for the following components:   aPTT 152 (*)    All other components within normal limits  GLUCOSE, CAPILLARY - Abnormal; Notable for the following components:   Glucose-Capillary 126 (*)    All other components within normal limits  APTT - Abnormal; Notable for the following components:   aPTT 132 (*)    All other components within normal limits  GLUCOSE, CAPILLARY - Abnormal; Notable for the following components:   Glucose-Capillary 119 (*)    All other components within normal limits  CBG MONITORING, ED - Abnormal; Notable for the following components:   Glucose-Capillary 132 (*)    All other components within normal limits  POCT I-STAT EG7 - Abnormal;  Notable for the following components:   pCO2, Ven 39.3 (*)    pO2, Ven 88.0 (*)    All other components within normal limits  CBG MONITORING, ED - Abnormal; Notable for the following components:   Glucose-Capillary 112 (*)    All other components within normal limits  SARS CORONAVIRUS 2 (TAT 6-24 HRS)  URINE CULTURE  AMMONIA   LACTIC ACID, PLASMA  TSH  RAPID URINE DRUG SCREEN, HOSP PERFORMED  AMMONIA  BASIC METABOLIC PANEL  HEMOGLOBIN A1C  LIPID PANEL  HEPARIN LEVEL (UNFRACTIONATED)  APTT  I-STAT VENOUS BLOOD GAS, ED    EKG EKG Interpretation  Date/Time:  Monday May 22 2019 17:22:13 EST Ventricular Rate:  68 PR Interval:    QRS Duration: 91 QT Interval:  406 QTC Calculation: 432 R Axis:   -18 Text Interpretation: Sinus rhythm Atrial premature complex Borderline left axis deviation Low voltage, precordial leads Abnormal T, consider ischemia, lateral leads Artifact in lead(s) I III aVR aVL Confirmed by Gerlene Fee [id number]) on 05/22/2019 6:01:39 PM   Radiology EEG  Result Date: 05/23/2019 Lora Havens, MD     05/23/2019  6:36 PM Patient Name: Katherine Miranda MRN: [id number] Epilepsy Attending: Lora Havens Referring Physician/Provider: Dr Gean Birchwood Date: 05/23/2019 Duration: 24.11 mins Patient history: 85yo F with ams. EEG to evaluate for seizure. Level of alertness: comatose AEDs during EEG study: None Technical aspects: This EEG study was done with scalp electrodes positioned according to the 10-20 International system of electrode placement. Electrical activity was acquired at a sampling rate of 500Hz  and reviewed with a high frequency filter of 70Hz  and a low frequency filter of 1Hz . EEG data were recorded continuously and digitally stored. DESCRIPTION: EEG showed continuous generalized 3-5Hz  theta-delta slowing admixed with 13-15hz  generalized bet activity, maximal frontocentral. Spikes were seen frequently in left occipita region, maximal O1. Hyperventilation and photic stimulation were not performed. ABNORMALITY - Continuous slow, generalized - Spikes, left occipital IMPRESSION: This study showed evidence of potential epileptogenicity in left occipital region. Additionally there is evidence of severe diffuse encephalopathy, non specific to etiology. No seizures were seen throughout  the recording. Lora Havens   DG Abd 1 View  Result Date: 05/23/2019 CLINICAL DATA:  Acute encephalopathy EXAM: ABDOMEN - 1 VIEW COMPARISON:  None. FINDINGS: The bowel gas pattern is normal. There is a moderate to large amount of rectal stool. No radio-opaque calculi or other significant radiographic abnormality are seen. Overlying spinal fixation hardware seen. IMPRESSION: Nonobstructive bowel gas pattern. Moderate to large amount of rectal stool. Electronically Signed   By: Prudencio Pair M.D.   On: 05/23/2019 00:20   CT HEAD WO CONTRAST  Result Date: 05/22/2019 CLINICAL DATA:  Rule out cerebral hemorrhage EXAM: CT HEAD WITHOUT CONTRAST TECHNIQUE: Contiguous axial images were obtained from the base of the skull through the vertex without intravenous contrast. COMPARISON:  CT head 04/20/2017 FINDINGS: Brain: Negative for acute hemorrhage. Moderate to advanced atrophy with ventricular enlargement which is stable. Advanced chronic microvascular ischemic changes throughout the white matter. Chronic infarct right inferior occipital lobe which has developed since the prior study. Negative for acute infarct or mass. Vascular: Negative for hyperdense vessel Skull: Negative Sinuses/Orbits: Paranasal sinuses clear. Bilateral cataract surgery. No orbital lesion. Other: None IMPRESSION: Moderate to advanced atrophy. Advanced chronic microvascular ischemic change in the white matter. Chronic infarct right inferior occipital lobe. Negative for acute infarct or hemorrhage. Electronically Signed   By: Franchot Gallo M.D.   On: 05/22/2019 17:29  MR BRAIN WO CONTRAST  Result Date: 05/23/2019 CLINICAL DATA:  Acute encephalopathy EXAM: MRI HEAD WITHOUT CONTRAST TECHNIQUE: Multiplanar, multiecho pulse sequences of the brain and surrounding structures were obtained without intravenous contrast. COMPARISON:  None. FINDINGS: Brain: There is variable reduced diffusion in the parasagittal left frontoparietal lobes as well  as the left pons and parasagittal right occipital lobe. There is a chronic right occipital infarct. Additional small chronic infarct of the left caudate. Additional confluent T2 hyperintensity in the supratentorial and pontine white matter likely reflects advanced chronic microvascular ischemic changes. Prominence of the ventricles and sulci reflects generalized parenchymal volume loss. There is disproportionate prominence of the ventricles likely reflecting central volume loss rather than hydrocephalus. There is no intracranial hemorrhage. There is no intracranial mass or significant mass effect. Vascular: Major vessel flow voids at the skull base are preserved. Skull and upper cervical spine: Normal marrow signal is preserved. Sinuses/Orbits: Mild mucosal thickening.  Orbits are unremarkable. Other: Sella is unremarkable.  Mastoid air cells are clear. IMPRESSION: Acute to subacute infarcts involving different vascular territories suggesting central embolic source. Greatest involvement is of the left ACA territory. Advanced chronic microvascular ischemic changes, chronic infarcts, and parenchymal volume loss as described above. Electronically Signed   By: Macy Mis M.D.   On: 05/23/2019 11:35   DG Chest Port 1 View  Result Date: 05/22/2019 CLINICAL DATA:  Altered mental status. EXAM: PORTABLE CHEST 1 VIEW COMPARISON:  04/20/2017. FINDINGS: Mediastinum and hilar structures normal. Heart size normal. No focal infiltrate. No pleural effusion or pneumothorax. Interposition of the colon right hemidiaphragm again noted. Thoracic spine scoliosis concave left. Diffuse degenerative change. IMPRESSION: No acute cardiopulmonary disease. Electronically Signed   By: Marcello Moores  Register   On: 05/22/2019 17:17   ECHOCARDIOGRAM COMPLETE  Result Date: 05/23/2019   ECHOCARDIOGRAM REPORT   Patient Name:   JUMANAH BOLING Date of Exam: 05/23/2019 Medical Rec #:  [id number]        Height:       62.0 in Accession #:     [id number]       Weight:       151.5 lb Date of Birth:  1920-10-15       BSA:          1.70 m Patient Age:    58 years         BP:           124/78 mmHg Patient Gender: F                HR:           59 bpm. Exam Location:  Inpatient Procedure: 2D Echo Indications:    Stroke 434.91 / I163.9  History:        Patient has no prior history of Echocardiogram examinations.                 Arrythmias:Atrial Fibrillation; Risk Factors:Hypertension.                 Dementia. decreased responsiveness.  Sonographer:    Darlina Sicilian RDCS Referring Phys: [place]  1. Left ventricular ejection fraction, by visual estimation, is 60 to 65%. The left ventricle has normal function. There is moderately increased left ventricular hypertrophy.  2. Left ventricular diastolic function could not be evaluated.  3. The left ventricle has no regional wall motion abnormalities.  4. Global right ventricle has normal systolic function.The right ventricular size is normal. No increase in right  ventricular wall thickness.  5. Left atrial size was mildly dilated.  6. Right atrial size was normal.  7. Small pericardial effusion.  8. The pericardial effusion is circumferential.  9. The mitral valve is normal in structure. No evidence of mitral valve regurgitation. 10. The tricuspid valve is normal in structure. Tricuspid valve regurgitation is not demonstrated. 11. The aortic valve is normal in structure. Aortic valve regurgitation is not visualized. 12. The pulmonic valve was grossly normal. Pulmonic valve regurgitation is not visualized. FINDINGS  Left Ventricle: Left ventricular ejection fraction, by visual estimation, is 60 to 65%. The left ventricle has normal function. The left ventricle has no regional wall motion abnormalities. The left ventricular internal cavity size was the left ventricle is normal in size. There is moderately increased left ventricular hypertrophy. Concentric left ventricular hypertrophy. The  left ventricular diastology could not be evaluated due to atrial fibrillation. Left ventricular diastolic function could  not be evaluated. Right Ventricle: The right ventricular size is normal. No increase in right ventricular wall thickness. Global RV systolic function is has normal systolic function. Left Atrium: Left atrial size was mildly dilated. Right Atrium: Right atrial size was normal in size Pericardium: A small pericardial effusion is present. The pericardial effusion is circumferential. Mitral Valve: The mitral valve is normal in structure. No evidence of mitral valve regurgitation. Tricuspid Valve: The tricuspid valve is normal in structure. Tricuspid valve regurgitation is not demonstrated. Aortic Valve: The aortic valve is normal in structure. Aortic valve regurgitation is not visualized. Pulmonic Valve: The pulmonic valve was grossly normal. Pulmonic valve regurgitation is not visualized. Pulmonic regurgitation is not visualized. Aorta: The aortic root and ascending aorta are structurally normal, with no evidence of dilitation. IAS/Shunts: No atrial level shunt detected by color flow Doppler.  LEFT VENTRICLE PLAX 2D LVIDd:         3.31 cm  Diastology LVIDs:         2.16 cm  LV e' lateral:   2.94 cm/s LV PW:         1.38 cm  LV E/e' lateral: 21.9 LV IVS:        1.41 cm  LV e' medial:    2.50 cm/s LVOT diam:     2.00 cm  LV E/e' medial:  25.7 LV SV:         29 ml LV SV Index:   16.53 LVOT Area:     3.14 cm  LEFT ATRIUM             Index LA diam:        3.70 cm 2.18 cm/m LA Vol (A2C):   36.0 ml 21.19 ml/m LA Vol (A4C):   60.7 ml 35.73 ml/m LA Biplane Vol: 45.8 ml 26.96 ml/m  AORTIC VALVE LVOT Vmax:   78.30 cm/s LVOT Vmean:  50.600 cm/s LVOT VTI:    0.169 m  AORTA Ao Root diam: 3.10 cm Ao Asc diam:  3.30 cm MITRAL VALVE MV Area (PHT): 2.48 cm              SHUNTS MV PHT:        88.74 msec            Systemic VTI:  0.17 m MV Decel Time: 306 msec              Systemic Diam: 2.00 cm MV E velocity:  64.30 cm/s  103 cm/s MV A velocity: 123.00 cm/s 70.3 cm/s MV E/A ratio:  0.52  1.5  Sanda Klein MD Electronically signed by Sanda Klein MD Signature Date/Time: 05/23/2019/4:55:23 PM    Final     Procedures Procedures (including critical care time)  Medications Ordered in ED Medications  latanoprost (XALATAN) 0.005 % ophthalmic solution 1 drop (1 drop Both Eyes Not Given 05/23/19 0150)  acetaminophen (TYLENOL) tablet 650 mg (has no administration in time range)    Or  acetaminophen (TYLENOL) suppository 650 mg (has no administration in time range)  ondansetron (ZOFRAN) tablet 4 mg (has no administration in time range)    Or  ondansetron (ZOFRAN) injection 4 mg (has no administration in time range)  dextrose 5 %-0.9 % sodium chloride infusion ( Intravenous New Bag/Given 05/23/19 0149)  hydrALAZINE (APRESOLINE) injection 10 mg (10 mg Intravenous Given 05/23/19 0014)  cefTRIAXone (ROCEPHIN) 1 g in sodium chloride 0.9 % 100 mL IVPB (1 g Intravenous New Bag/Given 05/23/19 1640)  heparin ADULT infusion 100 units/mL (25000 units/255mL sodium chloride 0.45%) (450 Units/hr Intravenous Rate/Dose Change 05/23/19 2249)  levETIRAcetam (KEPPRA) 250 mg in sodium chloride 0.9 % 100 mL IVPB (has no administration in time range)  lactated ringers bolus 1,000 mL (0 mLs Intravenous Stopped 05/22/19 2000)  cefTRIAXone (ROCEPHIN) 1 g in sodium chloride 0.9 % 100 mL IVPB (0 g Intravenous Stopped 05/23/19 0014)   stroke: mapping our early stages of recovery book ( Does not apply Given 05/23/19 1809)   stroke: mapping our early stages of recovery book (  Given 05/23/19 1842)    ED Course  I have reviewed the triage vital signs and the nursing notes.  Pertinent labs & imaging results that were available during my care of the patient were reviewed by me and considered in my medical decision making (see chart for details).    MDM Rules/Calculators/A&P                      Katherine Miranda is a 85  y.o. female with a medical history of atrial fibrillation, dementia who presents to the ED for altered mental status.  She presents by EMS from facility for decreased responsiveness per staff.  Patient's daughter reports that she has been more lethargic over the past month and has been treated for UTI twice in the past month.  Patient is unable to provide history.  Staff deny any seizure-like activity, vomiting, diarrhea or respiratory symptoms.  HPI and physical exam as above. She presents awake, disoriented, non verbal, responds to painful stimuli, hemodynamically stable, afebrile.   Workup for altered mental status. I do not think she is septic or altered due to trauma. Her laboratory work is significant for an aki and uti. She was treated with iv fluids and antibiotics. Will admit to hospitalist for further management.  I consulted medicine for admission, they have evaluated the patient and agree with plan for admission. I discussed this plan with the patient and family, they understand and agree with plan for admission.    Final Clinical Impression(s) / ED Diagnoses Final diagnoses:  Acute encephalopathy    Rx / DC Orders ED Discharge Orders    None       Shervin Cypert, Lovena Le, MD 05/24/19 0030    Maudie Flakes, MD 05/25/19 [phone]

## 2019-05-22 NOTE — H&P (Signed)
History and Physical    Katherine Miranda [id number] DOB: [birth year] DOA: 05/22/2019  PCP: [name], MD  Patient coming from: [place].  Chief Complaint: Less responsive.  History obtained from patient's daughter.  Patient has dementia.  HPI: Katherine Miranda is a 85 y.o. female with history of advanced dementia, atrial fibrillation, hypertension, hypothyroidism was brought to the ER after patient was found to be less responsive at the nursing facility since this afternoon.  As per patient's daughter who provided the history patient has been more lethargic over the last 1 month and was treated for urinary tract infection twice.  Yesterday patient became mostly lethargic and was brought to the ER.  No seizure-like activities no nausea vomiting diarrhea chest pain fever chills or shortness of breath.  ED Course: In the ER patient was afebrile UA is consistent with UTI CT head unremarkable EKG shows normal sinus rhythm chest x-ray unremarkable.  Labs reveal elevated creatinine at 1.5 baseline was around 0.8 in 2018.  Ammonia 24.  Patient was started on ceftriaxone for UTI.  At the time of my exam patient response to painful stimuli.  Pupils are reacting to light.  I examined the patient along with Dr. Cheral Marker neurologist.  Review of Systems: As per HPI, rest all negative.   Past Medical History:  Diagnosis Date  . A-fib ([place])   . Arthritis   . Dementia ([place])   . Hypertension     Past Surgical History:  Procedure Laterality Date  . BACK SURGERY    . CARDIAC CATHETERIZATION N/A 04/20/2015   Procedure: Temporary Pacemaker;  Surgeon: Lorretta Harp, MD;  Location: [facility];  Service: Cardiovascular;  Laterality: N/A;  . KNEE ARTHROSCOPY       reports that she has never smoked. She has never used smokeless tobacco. She reports that she does not drink alcohol or use drugs.  No Known Allergies  Family History  Problem Relation Age of Onset  .  Heart disease Father   . Diabetes Sister   . Dementia Brother     Prior to Admission medications   Medication Sig Start Date End Date Taking? Authorizing Provider  acetaminophen (TYLENOL) 500 MG tablet Take 500 mg by mouth every 4 (four) hours as needed (pain).   Yes [provider]  apixaban (ELIQUIS) 5 MG TABS tablet Take 5 mg by mouth 2 (two) times daily.   Yes [provider]  ascorbic acid (VITAMIN C) 500 MG tablet Take 1,000 mg by mouth daily. COVIDi prophylaxis   Yes [provider]  b complex vitamins tablet Take 1 tablet by mouth daily.   Yes [provider]  cholecalciferol (VITAMIN D) 1000 units tablet Take 1,000 Units daily by mouth.   Yes [provider]  cloNIDine (CATAPRES) 0.1 MG tablet Take 0.1 mg by mouth every 8 (eight) hours as needed (SBP >170).   Yes [provider]  diltiazem (CARTIA XT) 120 MG 24 hr capsule Take 120 mg by mouth every evening.   Yes [provider]  ivermectin (STROMECTOL) 3 MG TABS tablet Take 1.5 mg by mouth every Friday. For COVID prophylaxis   Yes [provider]  levothyroxine (SYNTHROID) 50 MCG tablet Take 50 mcg by mouth daily before breakfast.   Yes [provider]  lisinopril (ZESTRIL) 10 MG tablet Take 10 mg by mouth daily.   Yes [provider]  loratadine (CLARITIN) 10 MG tablet Take 10 mg by mouth daily as needed  for allergies.   Yes [provider]  nitroGLYCERIN (NITROSTAT) 0.4 MG SL tablet Place 0.4 mg under the tongue every 5 (five) minutes as needed for chest pain (if no relief in 15 minutes call MD/911).   Yes [provider]  Probiotic Product (RISA-BID PROBIOTIC) TABS Take 1 tablet daily by mouth.   Yes [provider]  senna (SENOKOT) 8.6 MG TABS tablet Take 1 tablet by mouth daily as needed (constipation).    Yes [provider]  Travoprost, BAK Free, (TRAVATAN) 0.004 % SOLN ophthalmic solution Place 1 drop at  bedtime into both eyes.   Yes [provider]  Zinc 50 MG TABS Take 50 mg by mouth daily.   Yes [provider]    Physical Exam: Constitutional: Moderately built and nourished. Vitals:   05/22/19 1900 05/22/19 1930 05/22/19 2040 05/22/19 2300  BP: (!) 170/92 (!) 184/86 (!) 175/86 (!) 187/93  Pulse: (!) 56 (!) 50 (!) 58 (!) 50  Resp: 18 17 14  (!) 25  Temp:      TempSrc:      SpO2: 100% 100% 100% 100%   Eyes: Anicteric no pallor. ENMT: No discharge from the ears eyes nose or mouth. Neck: No mass felt.  No neck rigidity. Respiratory: No rhonchi or crepitations. Cardiovascular: S1-S2 heard. Abdomen: Soft nontender bowel sounds present. Musculoskeletal: No edema.  No joint effusion. Skin: No rash. Neurologic: Patient is drowsy but responds to deep painful stimuli during which patient moves all extremities pupils are equal and reactive to light. Psychiatric: Drowsy and lethargic.   Labs on Admission: I have personally reviewed following labs and imaging studies  CBC: Recent Labs  Lab 05/22/19 1652 05/22/19 1751  WBC 9.8  --   NEUTROABS 7.6  --   HGB 14.0 13.9  HCT 43.7 41.0  MCV 91.0  --   PLT 234  --    Basic Metabolic Panel: Recent Labs  Lab 05/22/19 1652 05/22/19 1751  NA 139 139  K 4.9 4.3  CL 105  --   CO2 23  --   GLUCOSE 142*  --   BUN 29*  --   CREATININE 1.57*  --   CALCIUM 9.2  --    GFR: CrCl cannot be calculated (Unknown ideal weight.). Liver Function Tests: Recent Labs  Lab 05/22/19 1652  AST 28  ALT 13  ALKPHOS 110  BILITOT 1.2  PROT 6.8  ALBUMIN 3.4*   No results for input(s): LIPASE, AMYLASE in the last 168 hours. Recent Labs  Lab 05/22/19 1652  AMMONIA 24   Coagulation Profile: No results for input(s): INR, PROTIME in the last 168 hours. Cardiac Enzymes: No results for input(s): CKTOTAL, CKMB, CKMBINDEX, TROPONINI in the last 168 hours. BNP (last 3 results) No results for input(s): PROBNP in the last 8760  hours. HbA1C: No results for input(s): HGBA1C in the last 72 hours. CBG: Recent Labs  Lab 05/22/19 1629 05/22/19 2151  GLUCAP 132* 112*   Lipid Profile: No results for input(s): CHOL, HDL, LDLCALC, TRIG, CHOLHDL, LDLDIRECT in the last 72 hours. Thyroid Function Tests: Recent Labs    05/22/19 1743  TSH 2.553   Anemia Panel: No results for input(s): VITAMINB12, FOLATE, FERRITIN, TIBC, IRON, RETICCTPCT in the last 72 hours. Urine analysis:    Component Value Date/Time   COLORURINE YELLOW 05/22/2019 1819   APPEARANCEUR TURBID (A) 05/22/2019 1819   LABSPEC 1.019 05/22/2019 1819   PHURINE 5.0 05/22/2019 1819   GLUCOSEU NEGATIVE 05/22/2019 1819  HGBUR NEGATIVE 05/22/2019 1819   BILIRUBINUR NEGATIVE 05/22/2019 1819   KETONESUR 5 (A) 05/22/2019 1819   PROTEINUR 100 (A) 05/22/2019 1819   UROBILINOGEN 0.2 04/15/2015 1509   NITRITE NEGATIVE 05/22/2019 1819   LEUKOCYTESUR MODERATE (A) 05/22/2019 1819   Sepsis Labs: @LABRCNTIP (procalcitonin:4,lacticidven:4) )No results found for this or any previous visit (from the past 240 hour(s)).   Radiological Exams on Admission: CT HEAD WO CONTRAST  Result Date: 05/22/2019 CLINICAL DATA:  Rule out cerebral hemorrhage EXAM: CT HEAD WITHOUT CONTRAST TECHNIQUE: Contiguous axial images were obtained from the base of the skull through the vertex without intravenous contrast. COMPARISON:  CT head 04/20/2017 FINDINGS: Brain: Negative for acute hemorrhage. Moderate to advanced atrophy with ventricular enlargement which is stable. Advanced chronic microvascular ischemic changes throughout the white matter. Chronic infarct right inferior occipital lobe which has developed since the prior study. Negative for acute infarct or mass. Vascular: Negative for hyperdense vessel Skull: Negative Sinuses/Orbits: Paranasal sinuses clear. Bilateral cataract surgery. No orbital lesion. Other: None IMPRESSION: Moderate to advanced atrophy. Advanced chronic microvascular  ischemic change in the white matter. Chronic infarct right inferior occipital lobe. Negative for acute infarct or hemorrhage. Electronically Signed   By: Franchot Gallo M.D.   On: 05/22/2019 17:29   DG Chest Port 1 View  Result Date: 05/22/2019 CLINICAL DATA:  Altered mental status. EXAM: PORTABLE CHEST 1 VIEW COMPARISON:  04/20/2017. FINDINGS: Mediastinum and hilar structures normal. Heart size normal. No focal infiltrate. No pleural effusion or pneumothorax. Interposition of the colon right hemidiaphragm again noted. Thoracic spine scoliosis concave left. Diffuse degenerative change. IMPRESSION: No acute cardiopulmonary disease. Electronically Signed   By: Marcello Moores  Register   On: 05/22/2019 17:17    EKG: Independently reviewed.  Normal sinus rhythm.  Assessment/Plan Principal Problem:   Acute encephalopathy Active Problems:   Dementia (HCC)   Atrial fibrillation, chronic (HCC)   Hypothyroidism (acquired)   UTI (urinary tract infection)   Hypertensive urgency    1. Acute encephalopathy suspect likely from UTI and acute renal failure for which patient is on ceftriaxone and IV fluids.  Check MRI brain.  I examined the patient along with neurologist Dr. Cheral Marker. 2. Acute renal failure likely contributing to patient's symptoms.  Gently hydrate follow metabolic panel. 3. UTI see #1.  On antibiotics will follow cultures. 4. Hypertension uncontrolled we will keep patient on as needed hydralazine until patient passes swallow evaluation. 5. Hypothyroidism IV Synthroid until patient passes swallow evaluation. 6. History of dementia. 7. History of A. fib presently n.p.o. so we will keep patient on heparin following which if patient passes swallow will change to oral anticoagulation.  COVID-19 test is negative.   DVT prophylaxis: Heparin infusion. Code Status: DNR confirmed with patient's daughter. Family Communication: Patient's daughter. Disposition Plan: Back to facility when  stable. Consults called: Discussed with neurologist.  Speech therapist. Admission status: Observation.   [name] MD [facility] Pager [phone].  If 7PM-7AM, please contact night-coverage [email or web address] Password [facility]  05/22/2019, 11:44 PM

## 2019-05-23 ENCOUNTER — Inpatient Hospital Stay (HOSPITAL_COMMUNITY): Payer: Medicare Other

## 2019-05-23 ENCOUNTER — Observation Stay (HOSPITAL_COMMUNITY): Payer: Medicare Other

## 2019-05-23 ENCOUNTER — Inpatient Hospital Stay (HOSPITAL_COMMUNITY)
Admit: 2019-05-23 | Discharge: 2019-05-23 | Disposition: A | Discharge status: Home / Self Care | Payer: Medicare Other | Attending: Internal Medicine | Admitting: Internal Medicine

## 2019-05-23 DIAGNOSIS — Z66 Do not resuscitate: Secondary | ICD-10-CM | POA: Diagnosis present

## 2019-05-23 DIAGNOSIS — Z87440 Personal history of urinary (tract) infections: Secondary | ICD-10-CM | POA: Diagnosis not present

## 2019-05-23 DIAGNOSIS — Z79899 Other long term (current) drug therapy: Secondary | ICD-10-CM | POA: Diagnosis not present

## 2019-05-23 DIAGNOSIS — Z993 Dependence on wheelchair: Secondary | ICD-10-CM | POA: Diagnosis not present

## 2019-05-23 DIAGNOSIS — N179 Acute kidney failure, unspecified: Secondary | ICD-10-CM | POA: Diagnosis present

## 2019-05-23 DIAGNOSIS — I6389 Other cerebral infarction: Secondary | ICD-10-CM

## 2019-05-23 DIAGNOSIS — E039 Hypothyroidism, unspecified: Secondary | ICD-10-CM | POA: Diagnosis present

## 2019-05-23 DIAGNOSIS — Z8249 Family history of ischemic heart disease and other diseases of the circulatory system: Secondary | ICD-10-CM | POA: Diagnosis not present

## 2019-05-23 DIAGNOSIS — Z7901 Long term (current) use of anticoagulants: Secondary | ICD-10-CM | POA: Diagnosis not present

## 2019-05-23 DIAGNOSIS — Z820 Family history of epilepsy and other diseases of the nervous system: Secondary | ICD-10-CM | POA: Diagnosis not present

## 2019-05-23 DIAGNOSIS — I63422 Cerebral infarction due to embolism of left anterior cerebral artery: Secondary | ICD-10-CM | POA: Diagnosis present

## 2019-05-23 DIAGNOSIS — N390 Urinary tract infection, site not specified: Secondary | ICD-10-CM | POA: Diagnosis present

## 2019-05-23 DIAGNOSIS — F05 Delirium due to known physiological condition: Secondary | ICD-10-CM | POA: Diagnosis present

## 2019-05-23 DIAGNOSIS — N183 Chronic kidney disease, stage 3 unspecified: Secondary | ICD-10-CM | POA: Diagnosis present

## 2019-05-23 DIAGNOSIS — I160 Hypertensive urgency: Secondary | ICD-10-CM | POA: Diagnosis present

## 2019-05-23 DIAGNOSIS — F0390 Unspecified dementia without behavioral disturbance: Secondary | ICD-10-CM | POA: Diagnosis present

## 2019-05-23 DIAGNOSIS — Z8701 Personal history of pneumonia (recurrent): Secondary | ICD-10-CM | POA: Diagnosis not present

## 2019-05-23 DIAGNOSIS — M1990 Unspecified osteoarthritis, unspecified site: Secondary | ICD-10-CM | POA: Diagnosis present

## 2019-05-23 DIAGNOSIS — E1122 Type 2 diabetes mellitus with diabetic chronic kidney disease: Secondary | ICD-10-CM | POA: Diagnosis present

## 2019-05-23 DIAGNOSIS — I639 Cerebral infarction, unspecified: Secondary | ICD-10-CM | POA: Diagnosis not present

## 2019-05-23 DIAGNOSIS — G9341 Metabolic encephalopathy: Secondary | ICD-10-CM | POA: Diagnosis present

## 2019-05-23 DIAGNOSIS — I482 Chronic atrial fibrillation, unspecified: Secondary | ICD-10-CM | POA: Diagnosis present

## 2019-05-23 DIAGNOSIS — Z79890 Hormone replacement therapy (postmenopausal): Secondary | ICD-10-CM | POA: Diagnosis not present

## 2019-05-23 DIAGNOSIS — G9340 Encephalopathy, unspecified: Secondary | ICD-10-CM | POA: Diagnosis present

## 2019-05-23 DIAGNOSIS — Z20828 Contact with and (suspected) exposure to other viral communicable diseases: Secondary | ICD-10-CM | POA: Diagnosis present

## 2019-05-23 DIAGNOSIS — Z7189 Other specified counseling: Secondary | ICD-10-CM | POA: Diagnosis not present

## 2019-05-23 DIAGNOSIS — I129 Hypertensive chronic kidney disease with stage 1 through stage 4 chronic kidney disease, or unspecified chronic kidney disease: Secondary | ICD-10-CM | POA: Diagnosis present

## 2019-05-23 DIAGNOSIS — Z515 Encounter for palliative care: Secondary | ICD-10-CM | POA: Diagnosis not present

## 2019-05-23 LAB — BASIC METABOLIC PANEL
Anion gap: 13 (ref 5–15)
Anion gap: 13 (ref 5–15)
BUN: 28 mg/dL — ABNORMAL HIGH (ref 8–23)
BUN: 26 mg/dL — ABNORMAL HIGH (ref 8–23)
CO2: 23 mmol/L (ref 22–32)
CO2: 24 mmol/L (ref 22–32)
Calcium: 8.9 mg/dL (ref 8.9–10.3)
Calcium: 8.9 mg/dL (ref 8.9–10.3)
Chloride: 106 mmol/L (ref 98–111)
Chloride: 106 mmol/L (ref 98–111)
Creatinine, Ser: 1.22 mg/dL — ABNORMAL HIGH (ref 0.44–1.00)
Creatinine, Ser: 1.17 mg/dL — ABNORMAL HIGH (ref 0.44–1.00)
GFR calc Af Amer: 43 mL/min — ABNORMAL LOW (ref >60)
GFR calc Af Amer: 46 mL/min — ABNORMAL LOW (ref >60)
GFR calc non Af Amer: 37 mL/min — ABNORMAL LOW (ref >60)
GFR calc non Af Amer: 39 mL/min — ABNORMAL LOW (ref >60)
Glucose, Bld: 134 mg/dL — ABNORMAL HIGH (ref 70–99)
Glucose, Bld: 133 mg/dL — ABNORMAL HIGH (ref 70–99)
Potassium: 4.3 mmol/L (ref 3.5–5.1)
Potassium: 4.4 mmol/L (ref 3.5–5.1)
Sodium: 142 mmol/L (ref 135–145)
Sodium: 143 mmol/L (ref 135–145)

## 2019-05-23 LAB — GLUCOSE, CAPILLARY
Glucose-Capillary: 118 mg/dL — ABNORMAL HIGH (ref 70–99)
Glucose-Capillary: 111 mg/dL — ABNORMAL HIGH (ref 70–99)
Glucose-Capillary: 126 mg/dL — ABNORMAL HIGH (ref 70–99)
Glucose-Capillary: 119 mg/dL — ABNORMAL HIGH (ref 70–99)

## 2019-05-23 LAB — CBC
HCT: 39.1 % (ref 36.0–46.0)
Hemoglobin: 13.1 g/dL (ref 12.0–15.0)
MCH: 29.9 pg (ref 26.0–34.0)
MCHC: 33.5 g/dL (ref 30.0–36.0)
MCV: 89.3 fL (ref 80.0–100.0)
Platelets: 198 10*3/uL (ref 150–400)
RBC: 4.38 MIL/uL (ref 3.87–5.11)
RDW: 15.9 % — ABNORMAL HIGH (ref 11.5–15.5)
WBC: 10.5 10*3/uL (ref 4.0–10.5)
nRBC: 0 % (ref 0.0–0.2)

## 2019-05-23 LAB — SARS CORONAVIRUS 2 (TAT 6-24 HRS): SARS Coronavirus 2: NEGATIVE

## 2019-05-23 LAB — APTT
aPTT: 152 seconds — ABNORMAL HIGH (ref 24–36)
aPTT: 132 seconds — ABNORMAL HIGH (ref 24–36)

## 2019-05-23 LAB — HEPARIN LEVEL (UNFRACTIONATED): Heparin Unfractionated: >2.2 IU/mL — ABNORMAL HIGH (ref 0.30–0.70)

## 2019-05-23 LAB — AMMONIA: Ammonia: 15 umol/L (ref 9–35)

## 2019-05-23 LAB — ECHOCARDIOGRAM COMPLETE

## 2019-05-23 MED ORDER — STROKE: EARLY STAGES OF RECOVERY BOOK
Status: AC
  Filled 2019-05-23: qty 1

## 2019-05-23 MED ORDER — SODIUM CHLORIDE 0.9 % IV SOLN
250.0000 mg | Freq: Two times a day (BID) | INTRAVENOUS | Status: DC
  Administered 2019-05-24 – 2019-05-25 (×3): 250 mg via INTRAVENOUS
  Filled 2019-05-23 (×6): qty 2.5

## 2019-05-23 MED ORDER — HEPARIN (PORCINE) 25000 UT/250ML-% IV SOLN
550.0000 [IU]/h | INTRAVENOUS | Status: DC
  Administered 2019-05-24: 550 [IU]/h via INTRAVENOUS
  Filled 2019-05-23: qty 250

## 2019-05-23 MED ORDER — LEVETIRACETAM IN NACL 500 MG/100ML IV SOLN: 500.0000 mg | Freq: Two times a day (BID) | INTRAVENOUS | Status: DC

## 2019-05-23 MED ORDER — STROKE: EARLY STAGES OF RECOVERY BOOK
Freq: Once | Status: AC
  Filled 2019-05-23: qty 1

## 2019-05-23 NOTE — Progress Notes (Signed)
ANTICOAGULATION CONSULT NOTE - Follow-Up Consult  Pharmacy Consult for Heparin (Apixaban on hold) Indication: atrial fibrillation  No Known Allergies  Wt: 68 kg (2018) Ht: 62 inches IBW 50 kg Hep Wt: 62 kg  Vital Signs: Temp: 97.8 F (36.6 C) (12/22 0900) Temp Source: Axillary (12/22 0900) BP: 124/78 (12/22 0900) Pulse Rate: 70 (12/22 0900)  Labs: Recent Labs    05/22/19 1652 05/22/19 1751 05/23/19 0152 05/23/19 0620  HGB 14.0 13.9 13.1  --   HCT 43.7 41.0 39.1  --   PLT 234  --  198  --   CREATININE 1.57*  --  1.22* 1.17*    CrCl cannot be calculated (Unknown ideal weight.).   Medical History: Past Medical History:  Diagnosis Date  . A-fib (Katherine Miranda)   . Arthritis   . Dementia (Katherine Miranda)   . Hypertension     Assessment: 85 y/o F from a nursing facility with altered mental status. On apixaban PTA for afib. Pharmacy consulted to bridge with Heparin while NPO.    aPTT this morning is SUPRAtherapeutic (aPTT 152, goal 66-84) - noted MRI confirmed CVA so will aim for lower goal. Confirmed with RN/phleb that drawn from the opposite arm as the heparin infusion. Will hold the drip for 1 hour and reduce the rate.   Goal of Therapy:  Heparin level 0.3-0.7 units/ml aPTT 66-102 seconds Monitor platelets by anticoagulation protocol: Yes   Plan:  - Hold Heparin drip for 1 hour - Resume Heparin drip at a rate of 600 units/hr starting at 1300 today - Will continue to monitor for any signs/symptoms of bleeding and will follow up with aPTT level in 8 hours   Thank you for allowing pharmacy to be a part of this patient's care.  Alycia Rossetti, PharmD, BCPS Clinical Pharmacist Clinical phone for 05/23/2019: [phone] 05/23/2019 10:49 AM   **Pharmacist phone directory can now be found on [email or web address] ([email or web address]).  Listed under [place].

## 2019-05-23 NOTE — Consult Note (Addendum)
Neurology Consultation  Reason for Consult: stroke  Referring Physician: Dr Doristine Bosworth  CC: AMS/encephalopathy  History is obtained from: medical record   HPI: Katherine Miranda is a 85 y.o. female with past medical history of moderate to severe dementia with sundowning, chronic A.Fib on Eliquis, HTN, hypothyroidism, wheelchair bound at baseline who presented to the [facility] ED from nursing facility due to decreased responsiveness.  Per chart review, patient has been treated for UTI twice in the last month with hospitalization and has become more lethargic as well.  In the ED UA consistent with UTI and was started on ceftriaxone. Labs revealed  Bun and Cr elevated at 29 and 1.57. Ct head was negative for acute findings.  MRI revealed scattered acute/subacute infarcts.  Neurology consulted for stroke workup and recommnedations On exam patient is lying in bed with eyes closed, resting comfortable  LKW: prior to admission tpa given?: no, outside time frame Premorbid modified Rankin scale (mRS): 4-Needs assistance to walk and tend to bodily needs    ROS:  Unable to obtain due to altered mental status.   Past Medical History:  Diagnosis Date  . A-fib ([place])   . Arthritis   . Dementia ([place])   . Hypertension      Essential (primary) hypertension and Chronic atrial fibrillation   Family History  Problem Relation Age of Onset  . Heart disease Father   . Diabetes Sister   . Dementia Brother     Social History:   reports that she has never smoked. She has never used smokeless tobacco. She reports that she does not drink alcohol or use drugs.  Medications  Current Facility-Administered Medications:  .  acetaminophen (TYLENOL) tablet 650 mg, 650 mg, Oral, Q6H PRN **OR** acetaminophen (TYLENOL) suppository 650 mg, 650 mg, Rectal, Q6H PRN, [name], MD .  cefTRIAXone (ROCEPHIN) 1 g in sodium chloride 0.9 % 100 mL IVPB, 1 g, Intravenous, Q24H, Kakrakandy, Arshad N, MD .  dextrose 5  %-0.9 % sodium chloride infusion, , Intravenous, Continuous, [name], MD, Last Rate: 75 mL/hr at 05/23/19 0149, New Bag at 05/23/19 0149 .  heparin ADULT infusion 100 units/mL (25000 units/29m sodium chloride 0.45%), 600 Units/hr, Intravenous, Continuous, MRolla Flatten [facility] Last Rate: 6 mL/hr at 05/23/19 1307, 600 Units/hr at 05/23/19 1307 .  hydrALAZINE (APRESOLINE) injection 10 mg, 10 mg, Intravenous, Q4H PRN, KRise Patience MD, 10 mg at 05/23/19 0014 .  latanoprost (XALATAN) 0.005 % ophthalmic solution 1 drop, 1 drop, Both Eyes, QHS, Kakrakandy, Arshad N, MD .  ondansetron (ZOFRAN) tablet 4 mg, 4 mg, Oral, Q6H PRN **OR** ondansetron (ZOFRAN) injection 4 mg, 4 mg, Intravenous, Q6H PRN, KRise Patience MD   Exam: Current vital signs: BP 124/78 (BP Location: Left Arm)   Pulse 70   Temp 97.8 F (36.6 C) (Axillary)   Resp 16   SpO2 100%  Vital signs in last 24 hours: Temp:  [97.8 F (36.6 C)-98.2 F (36.8 C)] 97.8 F (36.6 C) (12/22 0900) Pulse Rate:  [49-79] 70 (12/22 0900) Resp:  [14-25] 16 (12/22 0900) BP: (124-187)/(65-103) 124/78 (12/22 0900) SpO2:  [96 %-100 %] 100 % (12/22 0900)  GENERAL: calm and comfortable, frail NAD HENT: - Normocephalic and atraumatic, dry mm Eyes: normal conjunctiva LUNGS - Clear to auscultation bilaterally with no wheezes CV - S1S2 RRR, no m/r/g, equal pulses bilaterally. ABDOMEN - Soft, nontender, nondistended with normoactive BS Ext: warm, well perfused, intact peripheral pulses, no edema, multiple bruises Psych: unable  to assess  NEURO:  Mental Status: Unable to assess, does not respond to voice, does not open eyes to voice or noxious stimuli, grimaces to sternal rub, occasional moan to noxious stimuli.  She does not follow command, spontaneously moves upper extremities, appears right>left Language: Nonverbal  Cranial Nerves: PERRL 53m/brisk. EOMI, visual fields unable to assess, no facial asymmetry, facial  sensation intact, hearing intact, tongue/uvula/soft palate midline, normal sternocleidomastoid and trapezius muscle strength. No evidence of tongue atrophy or fibrillations Motor: moves both upper extremities spontaneously antigravity, bilateral legs flicker to painful stimuli Tone: is normal and bulk is normal Sensation- appears to be intact to noxious stimuli Coordination: unable to assess Gait- deferred  Exam is limited due to mental status  1a Level of Conscious.: arousable to pain 2 1b LOC Questions: none are correct 2 1c LOC Commands: performs neither correct 2 2 Best Gaze: normal 0 3 Visual: no visual loss 0 4 Facial Palsy: normal 0 5a Motor Arm - left: some effort against gravity 2 5b Motor Arm - Right: some effort against gravity 2 6a Motor Leg - Left: no effort against gravity 3 6b Motor Leg - Right: no effort against gravity 3 7 Limb Ataxia: no ataxia 0 8 Sensory: normal 0 9 Best Language: severe aphasia 2 10 Dysarthria: severe 2 11 Extinct. and Inatten.: none 0 TOTAL NIHSS=20  Labs I have reviewed labs in epic and the results pertinent to this consultation are:   CBC    Component Value Date/Time   WBC 10.5 05/23/2019 0152   RBC 4.38 05/23/2019 0152   HGB 13.1 05/23/2019 0152   HCT 39.1 05/23/2019 0152   PLT 198 05/23/2019 0152   MCV 89.3 05/23/2019 0152   MCH 29.9 05/23/2019 0152   MCHC 33.5 05/23/2019 0152   RDW 15.9 (H) 05/23/2019 0152   LYMPHSABS 1.2 05/22/2019 1652   MONOABS 0.8 05/22/2019 1652   EOSABS 0.1 05/22/2019 1652   BASOSABS 0.0 05/22/2019 1652    CMP     Component Value Date/Time   NA 143 05/23/2019 0620   K 4.4 05/23/2019 0620   CL 106 05/23/2019 0620   CO2 24 05/23/2019 0620   GLUCOSE 133 (H) 05/23/2019 0620   BUN 26 (H) 05/23/2019 0620   CREATININE 1.17 (H) 05/23/2019 0620   CALCIUM 8.9 05/23/2019 0620   PROT 6.8 05/22/2019 1652   ALBUMIN 3.4 (L) 05/22/2019 1652   AST 28 05/22/2019 1652   ALT 13 05/22/2019 1652   ALKPHOS 110  05/22/2019 1652   BILITOT 1.2 05/22/2019 1652   GFRNONAA 39 (L) 05/23/2019 0620   GFRAA 46 (L) 05/23/2019 0620    Lipid Panel  No results found for: CHOL, TRIG, HDL, CHOLHDL, VLDL, LDLCALC, LDLDIRECT   Imaging I have reviewed the images obtained:  CT-scan of the brain  12/21 no acute abnormality   MRI examination of the brain  12/22 scattered acute to subacute infarcts in multiple terrorities  Assessment:   FSHERLENE RICKELis a 85y.o. female with past medical history of moderate to severe dementia with sundowning, chronic A.Fib on Eliquis, HTN, hypothyroidism, wheelchair bound at baseline [facility]  She has a current UTI being treated with ABX. 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 She has had multifocal infarct, but especially when superimposed on her underlying dementia, I think that her prognosis is relatively poor. It may be reasonable to have palliative care weigh in even given her pre-morbid state.   EEG has occasinal interictal sharp waves, though no history of seizures. Given the frequency, however, I would favor starting AED therapy as I think she is at high risk of seizure and will start keppra at this time.   Stroke workup as above.  Consider  Palliative care consultation.  Keppra '250mg'$  BID(eGFR 39) Stroke team to follow.    Roland Rack, MD [facility] [phone]  If 7pm- 7am, please page neurology on call as listed in [place].

## 2019-05-23 NOTE — Progress Notes (Signed)
EEG complete - results pending 

## 2019-05-23 NOTE — Progress Notes (Signed)
ANTICOAGULATION CONSULT NOTE - Follow-Up Consult  Pharmacy Consult for Heparin (Apixaban on hold) Indication: atrial fibrillation  No Known Allergies  Wt: 68 kg (2018) Ht: 62 inches IBW 50 kg Hep Wt: 62 kg  Vital Signs: Temp: 97.5 F (36.4 C) (12/22 2035) Temp Source: Oral (12/22 2035) BP: 160/66 (12/22 2035) Pulse Rate: 54 (12/22 2035)  Labs: Recent Labs    05/22/19 1652 05/22/19 1751 05/23/19 0152 05/23/19 0620 05/23/19 0915 05/23/19 2138  HGB 14.0 13.9 13.1  --   --   --   HCT 43.7 41.0 39.1  --   --   --   PLT 234  --  198  --   --   --   APTT  --   --   --   --  152* 132*  HEPARINUNFRC  --   --   --   --  >2.20*  --   CREATININE 1.57*  --  1.22* 1.17*  --   --     CrCl cannot be calculated (Unknown ideal weight.).   Medical History: Past Medical History:  Diagnosis Date  . A-fib ([place])   . Arthritis   . Dementia ([place])   . Hypertension     Assessment: 85 y/o F from a nursing facility with altered mental status. On apixaban PTA for afib. Pharmacy consulted to bridge with Heparin while NPO.    aPTT this morning is SUPRAtherapeutic (aPTT 152, goal 66-84) - noted MRI confirmed CVA so will aim for lower goal. Confirmed with RN/phleb that drawn from the opposite arm as the heparin infusion. Will hold the drip for 1 hour and reduce the rate.   PM f/u, aPTT still above goal range.  No overt bleeding or complications noted.  Goal of Therapy:  Heparin level 0.3-0.7 units/ml aPTT 66-102 seconds Monitor platelets by anticoagulation protocol: Yes   Plan:  - Reduce Heparin drip to 450 units/hr. - Will continue to monitor for any signs/symptoms of bleeding and will follow up with aPTT level in 8 hours   Thank you for allowing pharmacy to be a part of this patient's care.  [place] Crane, Roylene Reason, [facility] Clinical Pharmacist Phone [phone]  05/23/2019 10:48 PM   **Pharmacist phone directory can now be found on [email or web address] ([email or web address]).  Listed under [place].

## 2019-05-23 NOTE — Progress Notes (Signed)
New Admission Note:   Arrival Method: from ED via stretcher Mental Orientation: respond to voice Telemetry:Box 08, CCMD notified Assessment: to be completed Skin:Skin tear LLE, open and draining,           refer to flowsheet IV: Right hand, nsl Pain: 0/10 Tubes: None Safety Measures: Safety Fall Prevention Plan has been discussed  Admission: to be completed 5 [place] Orientation: Patient has been orientated to the room, unit and staff.   Family: none at bedside  Orders to be reviewed and implemented. Will continue to monitor the patient. Call light has been placed within reach and bed alarm has been activated.

## 2019-05-23 NOTE — Progress Notes (Signed)
  Echocardiogram 2D Echocardiogram has been performed.  Katherine Miranda M 05/23/2019, 3:45 PM

## 2019-05-23 NOTE — Procedures (Addendum)
Patient Name: MELISHA PRISK  MRN: [id number]  Epilepsy Attending: Lora Havens  Referring Physician/Provider: Dr Gean Birchwood Date: 05/23/2019 Duration: 24.11 mins  Patient history: 85yo F with ams. EEG to evaluate for seizure.  Level of alertness: comatose  AEDs during EEG study: None  Technical aspects: This EEG study was done with scalp electrodes positioned according to the 10-20 International system of electrode placement. Electrical activity was acquired at a sampling rate of 500Hz  and reviewed with a high frequency filter of 70Hz  and a low frequency filter of 1Hz . EEG data were recorded continuously and digitally stored.   DESCRIPTION: EEG showed continuous generalized 3-5Hz  theta-delta slowing admixed with 13-15hz  generalized bet activity, maximal frontocentral. Spikes were seen frequently in left occipita region, maximal O1. Hyperventilation and photic stimulation were not performed.  ABNORMALITY - Continuous slow, generalized - Spikes, left occipital  IMPRESSION: This study showed evidence of potential epileptogenicity in left occipital region. Additionally there is evidence of severe diffuse encephalopathy, non specific to etiology. No seizures were seen throughout the recording.  [place] Bueche Barbra Sarks

## 2019-05-23 NOTE — Progress Notes (Signed)
PT Cancellation Note  Patient Details Name: Katherine Miranda MRN: [id number] DOB: March 02, 1921   Cancelled Treatment:    Reason Eval/Treat Not Completed: Fatigue/lethargy limiting ability to participate  Spoke with RN and pt still has not aroused. Noted she did not arouse for attempted SLP session earlier. RN reports getting fluids and antibiotics and potentially will be able to participate 05/24/19   Arby Barrette, PT Pager [phone]  Rexanne Mano 05/23/2019, 3:25 PM

## 2019-05-23 NOTE — Progress Notes (Signed)
Notification to patient's daughter, [name], that patient will transfer to room 3W12. Dorthey Sawyer, RN

## 2019-05-23 NOTE — Progress Notes (Signed)
[facility] PROGRESS NOTE  Katherine Miranda [id number] DOB: 1921/01/01 DOA: 05/22/2019 PCP: [name], MD  Assessment/Plan:  #1.  Acute encephalopathy likely related to urinary tract infection in the setting of acute renal failure.  Remains nonverbal.  Solicit facial grimace with pretty vigorous sternal rub.  CT of the head without acute abnormalities.  No metabolic derangements.  Per her daughter her baseline is mostly recliner during the day bed after meals and at night.  Minimal communication.  Is not able to make her wants and needs known.  Prior to Covid patient was wheelchair mostly during the day.  Daughter states patient will "keep her eyes closed a lot of the time". -Continue Rocephin for UTI -Follow urine culture -MRI to rule out stroke -Continue heparin drip until results of MRI.  If negative can likely discontinue and resume home Eliquis if she is awake enough to take -If MRI positive for stroke call neuro team.  Chart indicates patient discussed with neuro last night but they are not on the care team list I suspect waiting for results of MRI  #2.  Acute renal failure likely related to decreased oral intake secondary to #1.  Creatinine 1.57 on admission.  Is trending down to 1.2 after IV fluids.  Of note creatinine within the limits of normal 2 years ago per chart.. -Hold nephrotoxins -Gentle IV fluids -Monitor urine output -Recheck in the a.m.  3.  Urinary tract infection.  Urinalysis consistent with UTI.  rocephin initiated in the emergency department.  Has a history of E. coli UTI.  Currently she is afebrile and hemodynamically stable -Monitor urine output -Follow urine culture -Continue Rocephin  #4.  Hypertension.  Blood pressure on the high end of normal.  Home medications include Catapres, diltiazem, lisinopril. -Hold lisinopril secondary to #2. -As needed hydralazine IV as patient not alert enough for p.o. meds -Monitor  #5.  Hypothyroidism.  Home  medications include Synthroid.  IV dose initiated.  #6.  History of A. fib.  Home medications include Eliquis and diltiazem.  EKG on admission shows sinus rhythm at 68.  -IV medications until awake enough for oral -Monitor on telemetry  #7.  History of dementia.  Patient's baseline status is wheelchair-bound mostly but is usually verbal, alert.  Code Status: DNR Family Communication: Daughter on telephone Disposition Plan: Back to facility   Consultants:    Procedures:    Antibiotics:  Rocephin 12/21>>  HPI/Subjective: Lying in bed with eyes closed.  Will facial grimace to vigorous sternal rub.  No acute distress  Objective: Vitals:   05/23/19 0210 05/23/19 0514  BP:  129/82  Pulse:  73  Resp:  14  Temp:  97.9 F (36.6 C)  SpO2: 98% 100%    Intake/Output Summary (Last 24 hours) at 05/23/2019 [id number] Last data filed at 05/23/2019 0600 Gross per 24 hour  Intake 1346.27 ml  Output 0 ml  Net 1346.27 ml   There were no vitals filed for this visit.  Exam:   General: Thin frail slightly pale no acute distress  Cardiovascular: Regular rate and rhythm no murmur gallop or rub no lower extremity edema feet are quite cold pedal pulses present palpable  Respiratory: Effort respirations slightly shallow with good air movement I hear no crackles no wheeze  Abdomen: Soft nondistended positive bowel sounds throughout no guarding or rebounding  Musculoskeletal: Joints without swelling/erythema  Neuro: Does not open eyes to vigorous sternal rub but will present a facial grimace.  Attempts to push hands  away.  Spontaneously moving upper extremity right greater than left.  Does not follow commands  Data Reviewed: Basic Metabolic Panel: Recent Labs  Lab 05/22/19 1652 05/22/19 1751 05/23/19 0152  NA 139 139 142  K 4.9 4.3 4.3  CL 105  --  106  CO2 23  --  23  GLUCOSE 142*  --  134*  BUN 29*  --  28*  CREATININE 1.57*  --  1.22*  CALCIUM 9.2  --  8.9   Liver  Function Tests: Recent Labs  Lab 05/22/19 1652  AST 28  ALT 13  ALKPHOS 110  BILITOT 1.2  PROT 6.8  ALBUMIN 3.4*   No results for input(s): LIPASE, AMYLASE in the last 168 hours. Recent Labs  Lab 05/22/19 1652 05/23/19 0152  AMMONIA 24 15   CBC: Recent Labs  Lab 05/22/19 1652 05/22/19 1751 05/23/19 0152  WBC 9.8  --  10.5  NEUTROABS 7.6  --   --   HGB 14.0 13.9 13.1  HCT 43.7 41.0 39.1  MCV 91.0  --  89.3  PLT 234  --  198   Cardiac Enzymes: No results for input(s): CKTOTAL, CKMB, CKMBINDEX, TROPONINI in the last 168 hours. BNP (last 3 results) No results for input(s): BNP in the last 8760 hours.  ProBNP (last 3 results) No results for input(s): PROBNP in the last 8760 hours.  CBG: Recent Labs  Lab 05/22/19 1629 05/22/19 2151 05/23/19 0147 05/23/19 0658  GLUCAP 132* 112* 118* 111*    Recent Results (from the past 240 hour(s))  SARS CORONAVIRUS 2 (TAT 6-24 HRS) Nasopharyngeal Nasopharyngeal Swab     Status: None   Collection Time: 05/22/19  7:46 PM   Specimen: Nasopharyngeal Swab  Result Value Ref Range Status   SARS Coronavirus 2 NEGATIVE NEGATIVE Final    Comment: (NOTE) SARS-CoV-2 target nucleic acids are NOT DETECTED. The SARS-CoV-2 RNA is generally detectable in upper and lower respiratory specimens during the acute phase of infection. Negative results do not preclude SARS-CoV-2 infection, do not rule out co-infections with other pathogens, and should not be used as the sole basis for treatment or other patient management decisions. Negative results must be combined with clinical observations, patient history, and epidemiological information. The expected result is Negative. Fact Sheet for Patients: SugarRoll.be Fact Sheet for Healthcare Providers: https://www.woods-mathews.com/ This test is not yet approved or cleared by the [place] FDA and  has been authorized for detection and/or diagnosis of  SARS-CoV-2 by FDA under an Emergency Use Authorization (EUA). This EUA will remain  in effect (meaning this test can be used) for the duration of the COVID-19 declaration under Section 56 4(b)(1) of the Act, 21 U.S.C. section 360bbb-3(b)(1), unless the authorization is terminated or revoked sooner. Performed at [facility], [place] [address] [place], [place] [address]      Studies: DG Abd 1 View  Result Date: 05/23/2019 CLINICAL DATA:  Acute encephalopathy EXAM: ABDOMEN - 1 VIEW COMPARISON:  None. FINDINGS: The bowel gas pattern is normal. There is a moderate to large amount of rectal stool. No radio-opaque calculi or other significant radiographic abnormality are seen. Overlying spinal fixation hardware seen. IMPRESSION: Nonobstructive bowel gas pattern. Moderate to large amount of rectal stool. Electronically Signed   By: Prudencio Pair M.D.   On: 05/23/2019 00:20   CT HEAD WO CONTRAST  Result Date: 05/22/2019 CLINICAL DATA:  Rule out cerebral hemorrhage EXAM: CT HEAD WITHOUT CONTRAST TECHNIQUE: Contiguous axial images were obtained from the  base of the skull through the vertex without intravenous contrast. COMPARISON:  CT head 04/20/2017 FINDINGS: Brain: Negative for acute hemorrhage. Moderate to advanced atrophy with ventricular enlargement which is stable. Advanced chronic microvascular ischemic changes throughout the white matter. Chronic infarct right inferior occipital lobe which has developed since the prior study. Negative for acute infarct or mass. Vascular: Negative for hyperdense vessel Skull: Negative Sinuses/Orbits: Paranasal sinuses clear. Bilateral cataract surgery. No orbital lesion. Other: None IMPRESSION: Moderate to advanced atrophy. Advanced chronic microvascular ischemic change in the white matter. Chronic infarct right inferior occipital lobe. Negative for acute infarct or hemorrhage. Electronically Signed   By: Franchot Gallo M.D.   On: 05/22/2019 17:29   DG Chest  Port 1 View  Result Date: 05/22/2019 CLINICAL DATA:  Altered mental status. EXAM: PORTABLE CHEST 1 VIEW COMPARISON:  04/20/2017. FINDINGS: Mediastinum and hilar structures normal. Heart size normal. No focal infiltrate. No pleural effusion or pneumothorax. Interposition of the colon right hemidiaphragm again noted. Thoracic spine scoliosis concave left. Diffuse degenerative change. IMPRESSION: No acute cardiopulmonary disease. Electronically Signed   By: [place]   On: 05/22/2019 17:17    Scheduled Meds: . latanoprost  1 drop Both Eyes QHS  . levothyroxine  50 mcg Intravenous Daily   Continuous Infusions: . cefTRIAXone (ROCEPHIN)  IV    . dextrose 5 % and 0.9% NaCl 75 mL/hr at 05/23/19 0149  . heparin 800 Units/hr (05/23/19 0150)    Principal Problem:   Acute encephalopathy Active Problems:   UTI (urinary tract infection)   Hypertensive urgency   Dementia (HCC)   Atrial fibrillation, chronic (HCC)   Hypothyroidism (acquired)   DNR (do not resuscitate)    Time spent: 38minutes    Jill Stopka M NP [facility]  If 7PM-7AM, please contact night-coverage at [email or web address], password [facility] 05/23/2019, 8:22 AM  LOS: 0 days

## 2019-05-23 NOTE — ED Notes (Signed)
ED TO INPATIENT HANDOFF REPORT  ED Nurse Name and Phone #: Lunette Stands [place] Name/Age/Gender Katherine Miranda 85 y.o. female Room/Bed: 011C/011C  Code Status   Code Status: DNR  Home/SNF/Other Nursing Home Is this baseline? Yes   Triage Complete: Triage complete  Chief Complaint Acute encephalopathy [G93.40]  Triage Note Pt to ED via EMS from [facility] in [place] d/t AMS. Per facility pt last known well was 1pm- Pt has hx dementia, and is usually verbal, pt currently non verbal and only responding to painful stimuli. #20 LFA- no medication given by EMS, Last VS: 146/80, P86, RR 22, O2 98%, CBG 136, Temp 97.9 GCS 11. Skin tear noted to LLE, open and draining. Hx Afib, dementia- currently taking elequis.     Allergies No Known Allergies  Level of Care/Admitting Diagnosis ED Disposition    ED Disposition Condition Comment   Admit  Hospital Area: [place] [[id number]]  Level of Care: Telemetry Medical [104]  I expect the patient will be discharged within 24 hours: No (not a candidate for 5C-Observation unit)  Covid Evaluation: Asymptomatic Screening Protocol (No Symptoms)  Diagnosis: Acute encephalopathy [id number]  Admitting Physician: Katherine Miranda [id number]  Attending Physician: Katherine Miranda [name]  PT Class (Do Not Modify): Observation [104]  PT Acc Code (Do Not Modify): Observation [10022]       B Medical/Surgery History Past Medical History:  Diagnosis Date  . A-fib ([place])   . Arthritis   . Dementia ([place])   . Hypertension    Past Surgical History:  Procedure Laterality Date  . BACK SURGERY    . CARDIAC CATHETERIZATION N/A 04/20/2015   Procedure: Temporary Pacemaker;  Surgeon: Lorretta Harp, MD;  Location: [place] [facility];  Service: Cardiovascular;  Laterality: N/A;  . KNEE ARTHROSCOPY       A IV Location/Drains/Wounds Patient Lines/Drains/Airways Status   Active Line/Drains/Airways     Name:   Placement date:   Placement time:   Site:   Days:   Peripheral IV 08/27/16 Left Antecubital   08/27/16    2347    Antecubital   999   Peripheral IV 05/22/19 Right Hand   05/22/19    2005    Hand   1   External Urinary Catheter   04/22/17    1403    -   761   Wound 10/06/12 Skin tear Elbow Left 2x1cm   10/06/12    2225    Elbow   2420          Intake/Output Last 24 hours  Intake/Output Summary (Last 24 hours) at 05/23/2019 0008 Last data filed at 05/22/2019 2000 Gross per 24 hour  Intake 1000 ml  Output -  Net 1000 ml    Labs/Imaging Results for orders placed or performed during the hospital encounter of 05/22/19 (from the past 48 hour(s))  CBG monitoring, ED     Status: Abnormal   Collection Time: 05/22/19  4:29 PM  Result Value Ref Range   Glucose-Capillary 132 (H) 70 - 99 mg/dL  Comprehensive metabolic panel     Status: Abnormal   Collection Time: 05/22/19  4:52 PM  Result Value Ref Range   Sodium 139 135 - 145 mmol/L   Potassium 4.9 3.5 - 5.1 mmol/L    Comment: HEMOLYSIS AT THIS LEVEL MAY AFFECT RESULT   Chloride 105 98 - 111 mmol/L   CO2 23 22 - 32 mmol/L   Glucose, Bld  142 (H) 70 - 99 mg/dL   BUN 29 (H) 8 - 23 mg/dL   Creatinine, Ser 1.57 (H) 0.44 - 1.00 mg/dL   Calcium 9.2 8.9 - 10.3 mg/dL   Total Protein 6.8 6.5 - 8.1 g/dL   Albumin 3.4 (L) 3.5 - 5.0 g/dL   AST 28 15 - 41 U/L   ALT 13 0 - 44 U/L   Alkaline Phosphatase 110 38 - 126 U/L   Total Bilirubin 1.2 0.3 - 1.2 mg/dL   GFR calc non Af Amer 28 (L) >60 mL/min   GFR calc Af Amer 32 (L) >60 mL/min   Anion gap 11 5 - 15    Comment: Performed at [facility] [address] [place], [place] [address]  CBC WITH DIFFERENTIAL     Status: Abnormal   Collection Time: 05/22/19  4:52 PM  Result Value Ref Range   WBC 9.8 4.0 - 10.5 K/uL   RBC 4.80 3.87 - 5.11 MIL/uL   Hemoglobin 14.0 12.0 - 15.0 g/dL   HCT 43.7 36.0 - 46.0 %   MCV 91.0 80.0 - 100.0 fL   MCH 29.2 26.0 - 34.0 pg   MCHC 32.0 30.0 -  36.0 g/dL   RDW 15.9 (H) 11.5 - 15.5 %   Platelets 234 150 - 400 K/uL   nRBC 0.0 0.0 - 0.2 %   Neutrophils Relative % 77 %   Neutro Abs 7.6 1.7 - 7.7 K/uL   Lymphocytes Relative 12 %   Lymphs Abs 1.2 0.7 - 4.0 K/uL   Monocytes Relative 8 %   Monocytes Absolute 0.8 0.1 - 1.0 K/uL   Eosinophils Relative 1 %   Eosinophils Absolute 0.1 0.0 - 0.5 K/uL   Basophils Relative 0 %   Basophils Absolute 0.0 0.0 - 0.1 K/uL   Immature Granulocytes 2 %   Abs Immature Granulocytes 0.18 (H) 0.00 - 0.07 K/uL    Comment: Performed at [facility] [address] [place], [place] [address]  Ammonia     Status: None   Collection Time: 05/22/19  4:52 PM  Result Value Ref Range   Ammonia 24 9 - 35 umol/L    Comment: Performed at [facility], [place] [address], [place], [place] [address]  Lactic acid, plasma     Status: None   Collection Time: 05/22/19  4:52 PM  Result Value Ref Range   Lactic Acid, Venous 1.7 0.5 - 1.9 mmol/L    Comment: Performed at [facility] [address] [place], [place] [address]  TSH     Status: None   Collection Time: 05/22/19  5:43 PM  Result Value Ref Range   TSH 2.553 0.350 - 4.500 uIU/mL    Comment: Performed by a 3rd Generation assay with a functional sensitivity of <=0.01 uIU/mL. Performed at [facility], [place] [address], [place], [place] [address]   POCT I-Stat EG7     Status: Abnormal   Collection Time: 05/22/19  5:51 PM  Result Value Ref Range   pH, Ven 7.401 7.250 - 7.430   pCO2, Ven 39.3 (L) 44.0 - 60.0 mmHg   pO2, Ven 88.0 (H) 32.0 - 45.0 mmHg   Bicarbonate 24.4 20.0 - 28.0 mmol/L   TCO2 26 22 - 32 mmol/L   O2 Saturation 97.0 %   Sodium 139 135 - 145 mmol/L   Potassium 4.3 3.5 - 5.1 mmol/L   Calcium, Ion 1.17 1.15 - 1.40 mmol/L  HCT 41.0 36.0 - 46.0 %   Hemoglobin 13.9 12.0 - 15.0 g/dL   Patient temperature HIDE    Sample type VENOUS   Urinalysis, Complete w Microscopic     Status: Abnormal   Collection Time: 05/22/19  6:19  PM  Result Value Ref Range   Color, Urine YELLOW YELLOW   APPearance TURBID (A) CLEAR   Specific Gravity, Urine 1.019 1.005 - 1.030   pH 5.0 5.0 - 8.0   Glucose, UA NEGATIVE NEGATIVE mg/dL   Hgb urine dipstick NEGATIVE NEGATIVE   Bilirubin Urine NEGATIVE NEGATIVE   Ketones, ur 5 (A) NEGATIVE mg/dL   Protein, ur 100 (A) NEGATIVE mg/dL   Nitrite NEGATIVE NEGATIVE   Leukocytes,Ua MODERATE (A) NEGATIVE   RBC / HPF 0-5 0 - 5 RBC/hpf   WBC, UA >50 (H) 0 - 5 WBC/hpf   Bacteria, UA MANY (A) NONE SEEN   WBC Clumps PRESENT    Non Squamous Epithelial 0-5 (A) NONE SEEN    Comment: Performed at [facility], [place] [address], [place], [place] [address]  Rapid urine drug screen (hospital performed)     Status: None   Collection Time: 05/22/19  6:19 PM  Result Value Ref Range   Opiates NONE DETECTED NONE DETECTED   Cocaine NONE DETECTED NONE DETECTED   Benzodiazepines NONE DETECTED NONE DETECTED   Amphetamines NONE DETECTED NONE DETECTED   Tetrahydrocannabinol NONE DETECTED NONE DETECTED   Barbiturates NONE DETECTED NONE DETECTED    Comment: (NOTE) DRUG SCREEN FOR MEDICAL PURPOSES ONLY.  IF CONFIRMATION IS NEEDED FOR ANY PURPOSE, NOTIFY LAB WITHIN 5 DAYS. LOWEST DETECTABLE LIMITS FOR URINE DRUG SCREEN Drug Class                     Cutoff (ng/mL) Amphetamine and metabolites    1000 Barbiturate and metabolites    200 Benzodiazepine                 [id number] Tricyclics and metabolites     300 Opiates and metabolites        300 Cocaine and metabolites        300 THC                            50 Performed at [facility], [place] [address], [place], [place] [address]   CBG monitoring, ED     Status: Abnormal   Collection Time: 05/22/19  9:51 PM  Result Value Ref Range   Glucose-Capillary 112 (H) 70 - 99 mg/dL   CT HEAD WO CONTRAST  Result Date: 05/22/2019 CLINICAL DATA:  Rule out cerebral hemorrhage EXAM: CT HEAD WITHOUT CONTRAST TECHNIQUE: Contiguous axial images were obtained  from the base of the skull through the vertex without intravenous contrast. COMPARISON:  CT head 04/20/2017 FINDINGS: Brain: Negative for acute hemorrhage. Moderate to advanced atrophy with ventricular enlargement which is stable. Advanced chronic microvascular ischemic changes throughout the white matter. Chronic infarct right inferior occipital lobe which has developed since the prior study. Negative for acute infarct or mass. Vascular: Negative for hyperdense vessel Skull: Negative Sinuses/Orbits: Paranasal sinuses clear. Bilateral cataract surgery. No orbital lesion. Other: None IMPRESSION: Moderate to advanced atrophy. Advanced chronic microvascular ischemic change in the white matter. Chronic infarct right inferior occipital lobe. Negative for acute infarct or hemorrhage. Electronically Signed   By: Franchot Gallo M.D.   On: 05/22/2019 17:29   DG Chest [facility]  1 View  Result Date: 05/22/2019 CLINICAL DATA:  Altered mental status. EXAM: PORTABLE CHEST 1 VIEW COMPARISON:  04/20/2017. FINDINGS: Mediastinum and hilar structures normal. Heart size normal. No focal infiltrate. No pleural effusion or pneumothorax. Interposition of the colon right hemidiaphragm again noted. Thoracic spine scoliosis concave left. Diffuse degenerative change. IMPRESSION: No acute cardiopulmonary disease. Electronically Signed   By: Marcello Moores  Register   On: 05/22/2019 17:17    Pending Labs Unresulted Labs (From admission, onward)    Start     Ordered   05/23/19 0900  Heparin level (unfractionated)  Once-Timed,   STAT     05/22/19 2355   05/23/19 0900  APTT  Once-Timed,   STAT     05/22/19 2355   05/23/19 [id number]  Basic metabolic panel  Tomorrow morning,   R     05/22/19 2343   05/23/19 0500  CBC  Tomorrow morning,   R     05/22/19 2343   05/22/19 2344  Ammonia  Once,   STAT     05/22/19 2343   05/22/19 1946  SARS CORONAVIRUS 2 (TAT 6-24 HRS) Nasopharyngeal Nasopharyngeal Swab  (Tier 3 (TAT 6-24 hrs))  Once,   STAT     Question Answer Comment  Is this test for diagnosis or screening Screening   Symptomatic for COVID-19 as defined by CDC No   Hospitalized for COVID-19 No   Admitted to ICU for COVID-19 No   Previously tested for COVID-19 No   Resident in a congregate (group) care setting No   Employed in healthcare setting No   Pregnant No      05/22/19 1945          Vitals/Pain Today's Vitals   05/22/19 1900 05/22/19 1930 05/22/19 2040 05/22/19 2300  BP: (!) 170/92 (!) 184/86 (!) 175/86 (!) 187/93  Pulse: (!) 56 (!) 50 (!) 58 (!) 50  Resp: 18 17 14  (!) 25  Temp:      TempSrc:      SpO2: 100% 100% 100% 100%    Isolation Precautions No active isolations  Medications Medications  latanoprost (XALATAN) 0.005 % ophthalmic solution 1 drop (has no administration in time range)  acetaminophen (TYLENOL) tablet 650 mg (has no administration in time range)    Or  acetaminophen (TYLENOL) suppository 650 mg (has no administration in time range)  ondansetron (ZOFRAN) tablet 4 mg (has no administration in time range)    Or  ondansetron (ZOFRAN) injection 4 mg (has no administration in time range)  dextrose 5 %-0.9 % sodium chloride infusion (has no administration in time range)  hydrALAZINE (APRESOLINE) injection 10 mg (has no administration in time range)  levothyroxine (SYNTHROID, LEVOTHROID) injection 50 mcg (has no administration in time range)  cefTRIAXone (ROCEPHIN) 1 g in sodium chloride 0.9 % 100 mL IVPB (has no administration in time range)  heparin ADULT infusion 100 units/mL (25000 units/233mL sodium chloride 0.45%) (has no administration in time range)  lactated ringers bolus 1,000 mL (0 mLs Intravenous Stopped 05/22/19 2000)  cefTRIAXone (ROCEPHIN) 1 g in sodium chloride 0.9 % 100 mL IVPB (1 g Intravenous New Bag/Given 05/22/19 2000)    Mobility non-ambulatory High fall risk   Focused Assessments Neuro Assessment Handoff:  Swallow screen pass? No      Last date known well:  05/22/19 Last time known well: 1300 Neuro Assessment: Exceptions to WDL Neuro Checks:      Last Documented NIHSS Modified Score:   Has TPA been given? No If patient is  a Neuro Trauma and patient is going to OR before floor call report to [facility] nurse: [phone] or [phone]     R Recommendations: See Admitting Provider Note  Report given to:   Additional Notes: N/A

## 2019-05-23 NOTE — Evaluation (Signed)
Clinical/Bedside Swallow Evaluation Patient Details  Name: Katherine Miranda MRN: [id number] Date of Birth: 08-18-1920  Today's Date: 05/23/2019 Time: SLP Start Time (ACUTE ONLY): 1215 SLP Stop Time (ACUTE ONLY): 1227 SLP Time Calculation (min) (ACUTE ONLY): 12 min  Past Medical History:  Past Medical History:  Diagnosis Date  . A-fib ([place])   . Arthritis   . Dementia ([place])   . Hypertension    Past Surgical History:  Past Surgical History:  Procedure Laterality Date  . BACK SURGERY    . CARDIAC CATHETERIZATION N/A 04/20/2015   Procedure: Temporary Pacemaker;  Surgeon: Lorretta Harp, MD;  Location: [facility];  Service: Cardiovascular;  Laterality: N/A;  . KNEE ARTHROSCOPY     HPI:  Katherine Miranda is a 85 y.o. female with history of advanced dementia, atrial fibrillation, hypertension, hypothyroidism was brought to the ER after patient was found to be less responsive at the nursing facility since this afternoon. found to have UTI, acute renal. MRI revealed Acute to subacute infarcts involving different vascular territories suggesting central embolic source. Greatest involvement is of the left ACA territory and parenchymal volume loss as described above.and parenchymal volume loss as described above.. Advanced chronic microvascular ischemic changes, chronic infarcts,Chest x-ray unremarkable. MBS 2018 no aspiration/penetration, reg/thin rec'd. Daughter reports history of aspiration pneumonia, currently on nectar thick liquids at nursing home.    Assessment / Plan / Recommendation Clinical Impression  Pt seen for bedside swallow with daughter at bedside. Pt sleeping and did not respond to cold washcloth, sternal rub or verbal cues with SLP or daughter initially. Used warm washcloth to remove dried mucous from eyelids and provided oral care. Ice chip placed on pt's lip with minimal movement or awareness. Toward end of session she did respond "ok" to daughters question. Daughter  reported pt has a history of aspiration pneumonia, was on nectar thick liquids and puree texture prior to this admission. Discussed briefly with daughter possible trajectory with her mom's swallow function given dementia and acute strokes. Continue oral care and npo status at present.     SLP Visit Diagnosis: Dysphagia, unspecified (R13.10)    Aspiration Risk  Moderate aspiration risk    Diet Recommendation NPO        Other  Recommendations Oral Care Recommendations: Oral care QID   Follow up Recommendations (TBD)      Frequency and Duration min 1 x/week  2 weeks       Prognosis Prognosis for Safe Diet Advancement: Fair Barriers to Reach Goals: Cognitive deficits      Swallow Study   General HPI: Katherine Miranda is a 85 y.o. female with history of advanced dementia, atrial fibrillation, hypertension, hypothyroidism was brought to the ER after patient was found to be less responsive at the nursing facility since this afternoon. found to have UTI, acute renal. MRI revealed Acute to subacute infarcts involving different vascular territories suggesting central embolic source. Greatest involvement is of the left ACA territory and parenchymal volume loss as described above.and parenchymal volume loss as described above.. Advanced chronic microvascular ischemic changes, chronic infarcts,Chest x-ray unremarkable. MBS 2018 no aspiration/penetration, reg/thin rec'd. Daughter reports history of aspiration pneumonia, currently on nectar thick liquids at nursing home.  Type of Study: Bedside Swallow Evaluation Previous Swallow Assessment: (see HPI) Diet Prior to this Study: NPO Temperature Spikes Noted: No Respiratory Status: Room air History of Recent Intubation: No Behavior/Cognition: Lethargic/Drowsy Oral Cavity Assessment: (unable to fully view) Oral Care Completed by SLP: Yes Oral  Cavity - Dentition: Adequate natural dentition;Other (Comment)(has a bridge) Patient Positioning: Partially  reclined Baseline Vocal Quality: (no verbalizations or vocalizations) Volitional Cough: Cognitively unable to elicit    Oral/Motor/Sensory Function Overall Oral Motor/Sensory Function: Other (comment)(not alert, selectively responsive)   Ice Chips Ice chips: (did not accept)   Thin Liquid Thin Liquid: Not tested    Nectar Thick Nectar Thick Liquid: Not tested   Honey Thick Honey Thick Liquid: Not tested   Puree Puree: Not tested   Solid     Solid: Not tested      Houston Siren 05/23/2019,1:20 PM  Orbie Pyo Colvin Caroli.Ed Risk analyst [phone] Office [phone]

## 2019-05-24 ENCOUNTER — Encounter (HOSPITAL_COMMUNITY): Payer: Medicare Other

## 2019-05-24 DIAGNOSIS — F0390 Unspecified dementia without behavioral disturbance: Secondary | ICD-10-CM

## 2019-05-24 DIAGNOSIS — Z7189 Other specified counseling: Secondary | ICD-10-CM

## 2019-05-24 DIAGNOSIS — Z515 Encounter for palliative care: Secondary | ICD-10-CM

## 2019-05-24 LAB — GLUCOSE, CAPILLARY: Glucose-Capillary: 132 mg/dL — ABNORMAL HIGH (ref 70–99)

## 2019-05-24 LAB — URINE CULTURE

## 2019-05-24 LAB — LIPID PANEL
Cholesterol: 193 mg/dL (ref 0–200)
HDL: 44 mg/dL (ref >40)
LDL Cholesterol: 121 mg/dL — ABNORMAL HIGH (ref 0–99)
Total CHOL/HDL Ratio: 4.4 RATIO
Triglycerides: 140 mg/dL (ref <150)
VLDL: 28 mg/dL (ref 0–40)

## 2019-05-24 LAB — BASIC METABOLIC PANEL
Anion gap: 9 (ref 5–15)
BUN: 18 mg/dL (ref 8–23)
CO2: 24 mmol/L (ref 22–32)
Calcium: 8.8 mg/dL — ABNORMAL LOW (ref 8.9–10.3)
Chloride: 111 mmol/L (ref 98–111)
Creatinine, Ser: 0.86 mg/dL (ref 0.44–1.00)
GFR calc Af Amer: >60 mL/min (ref >60)
GFR calc non Af Amer: 57 mL/min — ABNORMAL LOW (ref >60)
Glucose, Bld: 119 mg/dL — ABNORMAL HIGH (ref 70–99)
Potassium: 3.4 mmol/L — ABNORMAL LOW (ref 3.5–5.1)
Sodium: 144 mmol/L (ref 135–145)

## 2019-05-24 LAB — HEMOGLOBIN A1C
Hgb A1c MFr Bld: 6.3 % — ABNORMAL HIGH (ref 4.8–5.6)
Mean Plasma Glucose: 134.11 mg/dL

## 2019-05-24 LAB — HEPARIN LEVEL (UNFRACTIONATED): Heparin Unfractionated: >2.2 IU/mL — ABNORMAL HIGH (ref 0.30–0.70)

## 2019-05-24 LAB — APTT
aPTT: 43 seconds — ABNORMAL HIGH (ref 24–36)
aPTT: 76 seconds — ABNORMAL HIGH (ref 24–36)

## 2019-05-24 NOTE — Progress Notes (Addendum)
STROKE TEAM PROGRESS NOTE   HISTORY OF PRESENT ILLNESS (per record) Katherine Miranda is a 85 y.o. female with past medical history of moderate to severe dementia with sundowning, chronic A.Fib on Eliquis, HTN, hypothyroidism, wheelchair bound at baseline who presented to the [facility] ED from nursing facility due to decreased responsiveness.  Per chart review, patient has been treated for UTI twice in the last month with hospitalization and has become more lethargic as well.  In the ED UA consistent with UTI and was started on ceftriaxone. Ct head was negative for acute findings.  MRI revealed scattered acute/subacute infarcts.    INTERVAL HISTORY Her CNA is at the bedside. She remains lethargic, poorly responsive. Prognosis is poor. Recommend not pursuing further stroke wk up at this time. (Unable to get IV for CTA). Pt is DNR/DNI. Comfort care is recommended.  I have personally reviewed history of presenting illness with the patient, electronic medical records as well as imaging films in PACS.  Patient has history of baseline dementia as well as atrial fibrillation and presented with multiple embolic strokes and prognosis is poor and recommend palliative care OBJECTIVE Vitals:   05/24/19 0700 05/24/19 0816 05/24/19 1000 05/24/19 1206  BP:  116/68  115/77  Pulse:  95  72  Resp: 18 17  14   Temp:  98.2 F (36.8 C)  98.2 F (36.8 C)  TempSrc:  Oral  Oral  SpO2:  96%  93%  Weight:   58.1 kg     CBC:  Recent Labs  Lab 05/22/19 1652 05/22/19 1751 05/23/19 0152  WBC 9.8  --  10.5  NEUTROABS 7.6  --   --   HGB 14.0 13.9 13.1  HCT 43.7 41.0 39.1  MCV 91.0  --  89.3  PLT 234  --  99991111    Basic Metabolic Panel:  Recent Labs  Lab 05/23/19 0620 05/24/19 0745  NA 143 144  K 4.4 3.4*  CL 106 111  CO2 24 24  GLUCOSE 133* 119*  BUN 26* 18  CREATININE 1.17* 0.86  CALCIUM 8.9 8.8*    Lipid Panel:     Component Value Date/Time   CHOL 193 05/24/2019 0745   TRIG 140 05/24/2019 0745   HDL  44 05/24/2019 0745   CHOLHDL 4.4 05/24/2019 0745   VLDL 28 05/24/2019 0745   LDLCALC 121 (H) 05/24/2019 0745   HgbA1c:  Lab Results  Component Value Date   HGBA1C 6.3 (H) 05/24/2019   Urine Drug Screen:     Component Value Date/Time   LABOPIA NONE DETECTED 05/22/2019 1819   COCAINSCRNUR NONE DETECTED 05/22/2019 1819   LABBENZ NONE DETECTED 05/22/2019 1819   AMPHETMU NONE DETECTED 05/22/2019 1819   THCU NONE DETECTED 05/22/2019 1819   LABBARB NONE DETECTED 05/22/2019 1819    Alcohol Level     Component Value Date/Time   ETH <11 10/06/2012 2340    IMAGING   EEG  Result Date: 05/23/2019 Lora Havens, MD     05/23/2019  6:36 PM Patient Name: Katherine Miranda MRN: [id number] Epilepsy Attending: Lora Havens Referring Physician/Provider: Dr Gean Birchwood Date: 05/23/2019 Duration: 24.11 mins Patient history: 85yo F with ams. EEG to evaluate for seizure. Level of alertness: comatose AEDs during EEG study: None Technical aspects: This EEG study was done with scalp electrodes positioned according to the 10-20 International system of electrode placement. Electrical activity was acquired at a sampling rate of 500Hz  and reviewed with a high frequency filter of 70Hz  and  a low frequency filter of 1Hz . EEG data were recorded continuously and digitally stored. DESCRIPTION: EEG showed continuous generalized 3-5Hz  theta-delta slowing admixed with 13-15hz  generalized bet activity, maximal frontocentral. Spikes were seen frequently in left occipita region, maximal O1. Hyperventilation and photic stimulation were not performed. ABNORMALITY - Continuous slow, generalized - Spikes, left occipital IMPRESSION: This study showed evidence of potential epileptogenicity in left occipital region. Additionally there is evidence of severe diffuse encephalopathy, non specific to etiology. No seizures were seen throughout the recording. Lora Havens   DG Abd 1 View  Result Date:  05/23/2019 CLINICAL DATA:  Acute encephalopathy EXAM: ABDOMEN - 1 VIEW COMPARISON:  None. FINDINGS: The bowel gas pattern is normal. There is a moderate to large amount of rectal stool. No radio-opaque calculi or other significant radiographic abnormality are seen. Overlying spinal fixation hardware seen. IMPRESSION: Nonobstructive bowel gas pattern. Moderate to large amount of rectal stool. Electronically Signed   By: Prudencio Pair M.D.   On: 05/23/2019 00:20   CT HEAD WO CONTRAST  Result Date: 05/22/2019 CLINICAL DATA:  Rule out cerebral hemorrhage EXAM: CT HEAD WITHOUT CONTRAST TECHNIQUE: Contiguous axial images were obtained from the base of the skull through the vertex without intravenous contrast. COMPARISON:  CT head 04/20/2017 FINDINGS: Brain: Negative for acute hemorrhage. Moderate to advanced atrophy with ventricular enlargement which is stable. Advanced chronic microvascular ischemic changes throughout the white matter. Chronic infarct right inferior occipital lobe which has developed since the prior study. Negative for acute infarct or mass. Vascular: Negative for hyperdense vessel Skull: Negative Sinuses/Orbits: Paranasal sinuses clear. Bilateral cataract surgery. No orbital lesion. Other: None IMPRESSION: Moderate to advanced atrophy. Advanced chronic microvascular ischemic change in the white matter. Chronic infarct right inferior occipital lobe. Negative for acute infarct or hemorrhage. Electronically Signed   By: Franchot Gallo M.D.   On: 05/22/2019 17:29   MR BRAIN WO CONTRAST  Result Date: 05/23/2019 CLINICAL DATA:  Acute encephalopathy EXAM: MRI HEAD WITHOUT CONTRAST TECHNIQUE: Multiplanar, multiecho pulse sequences of the brain and surrounding structures were obtained without intravenous contrast. COMPARISON:  None. FINDINGS: Brain: There is variable reduced diffusion in the parasagittal left frontoparietal lobes as well as the left pons and parasagittal right occipital lobe. There is  a chronic right occipital infarct. Additional small chronic infarct of the left caudate. Additional confluent T2 hyperintensity in the supratentorial and pontine white matter likely reflects advanced chronic microvascular ischemic changes. Prominence of the ventricles and sulci reflects generalized parenchymal volume loss. There is disproportionate prominence of the ventricles likely reflecting central volume loss rather than hydrocephalus. There is no intracranial hemorrhage. There is no intracranial mass or significant mass effect. Vascular: Major vessel flow voids at the skull base are preserved. Skull and upper cervical spine: Normal marrow signal is preserved. Sinuses/Orbits: Mild mucosal thickening.  Orbits are unremarkable. Other: Sella is unremarkable.  Mastoid air cells are clear. IMPRESSION: Acute to subacute infarcts involving different vascular territories suggesting central embolic source. Greatest involvement is of the left ACA territory. Advanced chronic microvascular ischemic changes, chronic infarcts, and parenchymal volume loss as described above. Electronically Signed   By: Macy Mis M.D.   On: 05/23/2019 11:35   DG Chest Port 1 View  Result Date: 05/22/2019 CLINICAL DATA:  Altered mental status. EXAM: PORTABLE CHEST 1 VIEW COMPARISON:  04/20/2017. FINDINGS: Mediastinum and hilar structures normal. Heart size normal. No focal infiltrate. No pleural effusion or pneumothorax. Interposition of the colon right hemidiaphragm again noted. Thoracic spine scoliosis concave left.  Diffuse degenerative change. IMPRESSION: No acute cardiopulmonary disease. Electronically Signed   By: Marcello Moores  Register   On: 05/22/2019 17:17   ECHOCARDIOGRAM COMPLETE  Result Date: 05/23/2019   ECHOCARDIOGRAM REPORT   Patient Name:   IMAJEAN AUGUSTYN Date of Exam: 05/23/2019 Medical Rec #:  [id number]        Height:       62.0 in Accession #:    [id number]       Weight:       151.5 lb Date of Birth:  1920/10/08        BSA:          1.70 m Patient Age:    76 years         BP:           124/78 mmHg Patient Gender: F                HR:           59 bpm. Exam Location:  Inpatient Procedure: 2D Echo Indications:    Stroke 434.91 / I163.9  History:        Patient has no prior history of Echocardiogram examinations.                 Arrythmias:Atrial Fibrillation; Risk Factors:Hypertension.                 Dementia. decreased responsiveness.  Sonographer:    Darlina Sicilian RDCS Referring Phys: [place]  1. Left ventricular ejection fraction, by visual estimation, is 60 to 65%. The left ventricle has normal function. There is moderately increased left ventricular hypertrophy.  2. Left ventricular diastolic function could not be evaluated.  3. The left ventricle has no regional wall motion abnormalities.  4. Global right ventricle has normal systolic function.The right ventricular size is normal. No increase in right ventricular wall thickness.  5. Left atrial size was mildly dilated.  6. Right atrial size was normal.  7. Small pericardial effusion.  8. The pericardial effusion is circumferential.  9. The mitral valve is normal in structure. No evidence of mitral valve regurgitation. 10. The tricuspid valve is normal in structure. Tricuspid valve regurgitation is not demonstrated. 11. The aortic valve is normal in structure. Aortic valve regurgitation is not visualized. 12. The pulmonic valve was grossly normal. Pulmonic valve regurgitation is not visualized. FINDINGS  Left Ventricle: Left ventricular ejection fraction, by visual estimation, is 60 to 65%. The left ventricle has normal function. The left ventricle has no regional wall motion abnormalities. The left ventricular internal cavity size was the left ventricle is normal in size. There is moderately increased left ventricular hypertrophy. Concentric left ventricular hypertrophy. The left ventricular diastology could not be evaluated due to atrial  fibrillation. Left ventricular diastolic function could  not be evaluated. Right Ventricle: The right ventricular size is normal. No increase in right ventricular wall thickness. Global RV systolic function is has normal systolic function. Left Atrium: Left atrial size was mildly dilated. Right Atrium: Right atrial size was normal in size Pericardium: A small pericardial effusion is present. The pericardial effusion is circumferential. Mitral Valve: The mitral valve is normal in structure. No evidence of mitral valve regurgitation. Tricuspid Valve: The tricuspid valve is normal in structure. Tricuspid valve regurgitation is not demonstrated. Aortic Valve: The aortic valve is normal in structure. Aortic valve regurgitation is not visualized. Pulmonic Valve: The pulmonic valve was grossly normal. Pulmonic valve regurgitation is not visualized. Pulmonic regurgitation is  not visualized. Aorta: The aortic root and ascending aorta are structurally normal, with no evidence of dilitation. IAS/Shunts: No atrial level shunt detected by color flow Doppler.  LEFT VENTRICLE PLAX 2D LVIDd:         3.31 cm  Diastology LVIDs:         2.16 cm  LV e' lateral:   2.94 cm/s LV PW:         1.38 cm  LV E/e' lateral: 21.9 LV IVS:        1.41 cm  LV e' medial:    2.50 cm/s LVOT diam:     2.00 cm  LV E/e' medial:  25.7 LV SV:         29 ml LV SV Index:   16.53 LVOT Area:     3.14 cm  LEFT ATRIUM             Index LA diam:        3.70 cm 2.18 cm/m LA Vol (A2C):   36.0 ml 21.19 ml/m LA Vol (A4C):   60.7 ml 35.73 ml/m LA Biplane Vol: 45.8 ml 26.96 ml/m  AORTIC VALVE LVOT Vmax:   78.30 cm/s LVOT Vmean:  50.600 cm/s LVOT VTI:    0.169 m  AORTA Ao Root diam: 3.10 cm Ao Asc diam:  3.30 cm MITRAL VALVE MV Area (PHT): 2.48 cm              SHUNTS MV PHT:        88.74 msec            Systemic VTI:  0.17 m MV Decel Time: 306 msec              Systemic Diam: 2.00 cm MV E velocity: 64.30 cm/s  103 cm/s MV A velocity: 123.00 cm/s 70.3 cm/s MV E/A  ratio:  0.52        1.5  Mihai Croitoru MD Electronically signed by Sanda Klein MD Signature Date/Time: 05/23/2019/4:55:23 PM    Final     ECG - Afib. (See cardiology reading for complete details)   EEG This study showed evidence of potential epileptogenicity in left occipital region. Additionally there is evidence of severe diffuse encephalopathy, non specific to etiology. No seizures were seen throughout the recording.   PHYSICAL EXAM Blood pressure 115/77, pulse 72, temperature 98.2 F (36.8 C), temperature source Oral, resp. rate 14, weight 58.1 kg, SpO2 93 %.  GENERAL: fail, malnourished looking elderly lady, mild distress HENT: - Normocephalic and atraumatic, dry Eyes: normal conjunctiva LUNGS - Clear to auscultation bilaterally with no wheezes CV - S1S2 RRR, no m/r/g, equal pulses bilaterally. ABDOMEN - Soft, nontender, nondistended with normoactive BS Ext: warm, well perfused, intact peripheral pulses, no edema, multiple bruises Psych: unable to assess  NEURO:  Mental Status: obtunded, does not respond to voice, does not open eyes to voice or noxious stimuli, grimaces to sternal rub, occasional moan to noxious stimuli.  She does not follow command, spontaneously moves upper extremities, appears right>left Language: Nonverbal  Cranial Nerves: PERRL 58mm/brisk. EOMI, visual fields unable to assess, no facial asymmetry, facial sensation intact, hearing intact, tongue/uvula/soft palate midline, normal sternocleidomastoid and trapezius muscle strength. No evidence of tongue atrophy or fibrillations Motor: moves both upper extremities spontaneously antigravity, bilateral legs flicker to painful stimuli Tone: is normal and bulk is normal Sensation- appears to be intact to noxious stimuli Coordination: unable to assess Gait- deferred  HOME MEDICATIONS:  Medications Prior to Admission  Medication Sig  Dispense Refill  . acetaminophen (TYLENOL) 500 MG tablet Take 500 mg by mouth  every 4 (four) hours as needed (pain).    Marland Kitchen apixaban (ELIQUIS) 5 MG TABS tablet Take 5 mg by mouth 2 (two) times daily.    Marland Kitchen ascorbic acid (VITAMIN C) 500 MG tablet Take 1,000 mg by mouth daily. COVIDi prophylaxis    . b complex vitamins tablet Take 1 tablet by mouth daily.    . cholecalciferol (VITAMIN D) 1000 units tablet Take 1,000 Units daily by mouth.    . cloNIDine (CATAPRES) 0.1 MG tablet Take 0.1 mg by mouth every 8 (eight) hours as needed (SBP >170).    Marland Kitchen diltiazem (CARTIA XT) 120 MG 24 hr capsule Take 120 mg by mouth every evening.    . ivermectin (STROMECTOL) 3 MG TABS tablet Take 1.5 mg by mouth every Friday. For COVID prophylaxis    . levothyroxine (SYNTHROID) 50 MCG tablet Take 50 mcg by mouth daily before breakfast.    . lisinopril (ZESTRIL) 10 MG tablet Take 10 mg by mouth daily.    Marland Kitchen loratadine (CLARITIN) 10 MG tablet Take 10 mg by mouth daily as needed for allergies.    . nitroGLYCERIN (NITROSTAT) 0.4 MG SL tablet Place 0.4 mg under the tongue every 5 (five) minutes as needed for chest pain (if no relief in 15 minutes call MD/911).    . Probiotic Product (RISA-BID PROBIOTIC) TABS Take 1 tablet daily by mouth.    . senna (SENOKOT) 8.6 MG TABS tablet Take 1 tablet by mouth daily as needed (constipation).     . Travoprost, BAK Free, (TRAVATAN) 0.004 % SOLN ophthalmic solution Place 1 drop at bedtime into both eyes.    . Zinc 50 MG TABS Take 50 mg by mouth daily.        HOSPITAL MEDICATIONS:  . latanoprost  1 drop Both Eyes QHS    ALLERGIES No Known Allergies  ASSESSMENT/PLAN  SENTORIA ETRIS is a 85 y.o. female with past medical history of moderate to severe dementia with sundowning, chronic A.Fib on Eliquis, HTN, hypothyroidism, wheelchair bound at baseline who presented to the [facility] ED from nursing facility due to decreased responsiveness.  She has a current UTI being treated with ABX. 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 LA dilation, LVH  Sars Corona Virus 2  neg  LDL - 121    Component Value Date/Time   LDLCALC 121 (H) 05/24/2019 0745     HgbA1c - 6.3  UDS neg  VTE prophylaxis - scd Diet  Diet Order            Diet NPO time specified  Diet effective now               Eliquis prior to admission, now on Eliquis  Unable to counsel Patient  to be compliant with her antithrombotic medications  Ongoing aggressive stroke risk factor management  Therapy recommendations:  pending  Disposition:  Pending  Hypertension  Home BP meds:  Clonidine, diltiazem, ivermectin, zestril  Current BP meds: none   Stable . Permissive hypertension (OK if < 220/120) but gradually normalize in 5-7 days  . Long-term BP goal normotensive  Hyperlipidemia  Home Lipid lowering medication: none   LDL 124, goal < 70 Current lipid lowering medication:  None d/t DNR/DNI  Diabetes  Home diabetic meds: none   Current diabetic meds: SSI   HgbA1c6.3 , goal < 7.0 Recent Labs    05/23/19 1139 05/23/19 1658 05/24/19 0621  GLUCAP 126* 119* 132*     Other Stroke Risk Factors  Advanced age  Hx stroke/TIA  Family hx stroke  Congestive Heart Failure  Atrial fibrillation   Other Active Problems  Dementia w/sundowning  Chrnoic debility mRS4  Abnormal EEG with occp  Sharps- 250mg  Keppra started  UTI (h/o freq UTI)- IV Rocephin started   Recommend Palliative Care consult  Hospital day # 1  Desiree Metzger-Cihelka, ARNP-C, ANVP-BC Pager: [phone]  I have  personally obtained history,examined this patient, reviewed notes, independently viewed imaging studies, participated in medical decision making and plan of care.ROS completed by me personally and pertinent positives fully documented  I have made any additions or clarifications directly to the above note.  She presented with altered mental status and MRI scan shows multiple embolic strokes and she has known history of atrial fibrillation as well as baseline dementia.  Overall general medical condition is quite poor and do not recommend aggressive work-up.  Palliative care consult and comfort care measures would be appropriate.  Discussed with Dr. Cordelia Poche.  Greater than 50% time during this 35-minute visit was spent on counseling and coordination of care and discussion with care team and answering questions  Antony Contras, MD Medical Director [place] Pager: [phone] 05/24/2019 3:16 PM  To contact Stroke Continuity provider, please refer to [email or web address] After hours, contact General Neurology

## 2019-05-24 NOTE — Progress Notes (Addendum)
OT Cancellation Note  Patient Details Name: Katherine Miranda MRN: [id number] DOB: 1920-10-24   Cancelled Treatment:    Reason Eval/Treat Not Completed: Active bedrest order and not medically appropriate due to level of arousal. Will re-attempt when pt is appropriate and as time allows.   Dorinda Hill OTR/L Acute Rehabilitation Services Office: [place] 05/24/2019, 8:42 AM

## 2019-05-24 NOTE — Progress Notes (Signed)
PT Cancellation Note  Patient Details Name: Katherine Miranda MRN: [id number] DOB: 04/06/1921   Cancelled Treatment:    Reason Eval/Treat Not Completed: Fatigue/lethargy limiting ability to participate;Active bedrest order Not medically appropriate due to level of arousal.  Ellamae Sia, PT, DPT Acute Rehabilitation Services Pager [phone] Office [phone]  Willy Eddy 05/24/2019, 10:33 AM

## 2019-05-24 NOTE — Consult Note (Signed)
Consultation Note Date: 05/24/2019   Patient Name: Katherine Miranda  DOB: 01/27/1921  MRN: [id number]  Age / Sex: 85 y.o., female  PCP: [name], MD Referring Physician: Mariel Aloe, MD  Reason for Consultation: Establishing goals of care  HPI/Patient Profile: 85 y.o. female  with past medical history of advanced dementia, atrial fibrillation (on Eliquis), hypertension, hypothyroidism, aspiration pneumonia, puree and nectar thick diet at home, wheelchair bound, recent UTI x 2 admitted on 05/22/2019 with less responsive and MRI brain found to have acute and subacute infarcts mostly in left ACA territory and likely embolic source as well as "advanced chronic microvascular ischemic changes, chronic infarcts and parenchymal volume loss."   Clinical Assessment and Goals of Care: I have reviewed chart and records. I have spoken with bedside RN and met with patient at bedside. Unfortunately we have recently missed daughter, [name], who has been at bedside recently. Ms. Menta is mostly unresponsive except for a slight grimace to painful stimuli but does not withdraw. No verbalization or movement of extremities noted.   I called to speak with [name]. [name] has just left and seemed taken back that palliative care is already in touch with her. I explained that our role is to support her through this process and just to talk about options and decisions they may face as a family. [name] is open to conversation but feels that these decisions may be rushed. I did explain that there is no agenda and that we only want to support her and are willing to speak with her tomorrow when convenient. She also wants her siblings to be part of the conversation but they are unlikely to be available tomorrow. I reassured her that this is no problem that we can speak with whoever is available when convenient to begin discussing options and  providing support.   I tried to reassure [name] that the medical team is not trying to rush or give up on her mother but just concerned with her overall functional status and fragile state prior to this acute hospital stay that we are worried about her ability to overcome this acute illness and concerned about her quality of life moving forward. [name] understands and will be available to discuss further at bedside tomorrow.   Primary Decision Maker NEXT OF KIN 3 adult children    SUMMARY OF RECOMMENDATIONS   - DNR in place - Further conversation with [name] and other children as available tomorrow  Code Status/Advance Care Planning:  DNR   Symptom Management:   Appears comfortable. No current symptoms to manage.   Palliative Prophylaxis:   Aspiration, Bowel Regimen, Delirium Protocol, Oral Care and Turn Reposition  Psycho-social/Spiritual:   Desire for further Chaplaincy support:yes  Additional Recommendations: Education on Hospice and Grief/Bereavement Support  Prognosis:   Hours - Days  Discharge Planning: To Be Determined      Primary Diagnoses: Present on Admission: . Acute encephalopathy . Dementia ([place]) . Atrial fibrillation, chronic ([place]) . UTI (urinary tract infection) . Hypothyroidism (acquired) . Hypertensive  urgency . DNR (do not resuscitate) . Acute embolic stroke ([place])   I have reviewed the medical record, interviewed the patient and family, and examined the patient. The following aspects are pertinent.  Past Medical History:  Diagnosis Date  . A-fib ([place])   . Arthritis   . Dementia ([place])   . Hypertension    Social History   Socioeconomic History  . Marital status: Widowed    Spouse name: Not on file  . Number of children: Not on file  . Years of education: Not on file  . Highest education level: Not on file  Occupational History  . Not on file  Tobacco Use  . Smoking status: Never Smoker  . Smokeless tobacco: Never Used  Substance  and Sexual Activity  . Alcohol use: No  . Drug use: No  . Sexual activity: Never  Other Topics Concern  . Not on file  Social History Narrative  . Not on file   Social Determinants of Health   Financial Resource Strain:   . Difficulty of Paying Living Expenses: Not on file  Food Insecurity:   . Worried About Charity fundraiser in the Last Year: Not on file  . Ran Out of Food in the Last Year: Not on file  Transportation Needs:   . Lack of Transportation (Medical): Not on file  . Lack of Transportation (Non-Medical): Not on file  Physical Activity:   . Days of Exercise per Week: Not on file  . Minutes of Exercise per Session: Not on file  Stress:   . Feeling of Stress : Not on file  Social Connections:   . Frequency of Communication with Friends and Family: Not on file  . Frequency of Social Gatherings with Friends and Family: Not on file  . Attends Religious Services: Not on file  . Active Member of Clubs or Organizations: Not on file  . Attends Archivist Meetings: Not on file  . Marital Status: Not on file   Family History  Problem Relation Age of Onset  . Heart disease Father   . Diabetes Sister   . Dementia Brother    Scheduled Meds: . latanoprost  1 drop Both Eyes QHS   Continuous Infusions: . cefTRIAXone (ROCEPHIN)  IV 1 g (05/24/19 1410)  . heparin 550 Units/hr (05/24/19 1414)  . levETIRAcetam 250 mg (05/24/19 0914)   PRN Meds:.acetaminophen **OR** acetaminophen, hydrALAZINE, ondansetron **OR** ondansetron (ZOFRAN) IV No Known Allergies Review of Systems  Unable to perform ROS: Acuity of condition    Physical Exam Vitals and nursing note reviewed.  Constitutional:      General: She is not in acute distress.    Appearance: She is ill-appearing.     Comments: Elderly, frail  Cardiovascular:     Rate and Rhythm: Normal rate.  Pulmonary:     Effort: Pulmonary effort is normal. No tachypnea, accessory muscle usage or respiratory distress.      Comments: Breathing regular but very shallow; no distress Abdominal:     Palpations: Abdomen is soft.  Neurological:     Comments: Only grimace to painful stimuli; no movement or verbalization; does not open eyes     Vital Signs: BP 115/77 (BP Location: Left Arm)   Pulse 72   Temp 98.2 F (36.8 C) (Oral)   Resp 14   Wt 58.1 kg   SpO2 93%   BMI 23.41 kg/m  Pain Scale: Faces   Pain Score: 0-No pain   SpO2: SpO2: 93 %  O2 Device:SpO2: 93 % O2 Flow Rate: .   IO: Intake/output summary:   Intake/Output Summary (Last 24 hours) at 05/24/2019 1519 Last data filed at 05/24/2019 1000 Gross per 24 hour  Intake 408.53 ml  Output 0 ml  Net 408.53 ml    LBM: Last BM Date: (uNABLE TO ASSESS) Baseline Weight: Weight: 58.1 kg Most recent weight: Weight: 58.1 kg     Palliative Assessment/Data: 10%     Time In: 1600 Time Out: 1650 Time Total: 50 min Greater than 50%  of this time was spent counseling and coordinating care related to the above assessment and plan.  Signed by: Vinie Sill, NP Palliative Medicine Team Pager # [phone] (M-F 8a-5p) Team Phone # [phone] (Nights/Weekends)

## 2019-05-24 NOTE — Progress Notes (Signed)
ANTICOAGULATION CONSULT NOTE   Pharmacy Consult for Heparin  Indication: atrial fibrillation  No Known Allergies  Wt: 68 kg (2018) Ht: 62 inches IBW 50 kg   Vital Signs: Temp: 97.9 F (36.6 C) (12/23 1706) Temp Source: Oral (12/23 1706) BP: 141/83 (12/23 1706) Pulse Rate: 70 (12/23 1706)  Labs: Recent Labs    05/22/19 1652 05/22/19 1652 05/22/19 1751 05/23/19 0152 05/23/19 0620 05/23/19 0915 05/23/19 2138 05/24/19 0745 05/24/19 1738  HGB 14.0  --  13.9 13.1  --   --   --   --   --   HCT 43.7  --  41.0 39.1  --   --   --   --   --   PLT 234  --   --  198  --   --   --   --   --   APTT  --    < >  --   --   --  152* 132* 43* 76*  HEPARINUNFRC  --   --   --   --   --  >2.20*  --  >2.20*  --   CREATININE 1.57*  --   --  1.22* 1.17*  --   --  0.86  --    < > = values in this interval not displayed.    CrCl cannot be calculated (Unknown ideal weight.).   Medical History: Past Medical History:  Diagnosis Date  . A-fib ([place])   . Arthritis   . Dementia ([place])   . Hypertension     Assessment: 85 y/o F from a nursing facility with altered mental status. On apixaban PTA for afib. Pharmacy consulted to bridge with Heparin while NPO.    MRI 12/22 confirmed acute CVA so will aim for lower goal. Pt did not receive tPA, secondary to being on Eliquis. Last dose of Eliquis was 12/21 in the morning. Her heparin level and aPTT have fluctuated quite a bit, we have yet to find a rate of heparin that is therapeutic for Ms. Farabaugh. Today her HL is elevated, a lab effect from Eliquis. I will disregard this and use aPTT until Eliquis is washed out. Her aPTT is subtherapeutic this morning.   Repeat aPTT therapeutic at 76 seconds.   Goal of Therapy:  Heparin level 0.3-0.5 units/ml aPTT 66-85 seconds Monitor platelets by anticoagulation protocol: Yes    Plan:  -Continue heparin 550 units/hr -Recheck with morning labs   Arrie Senate, PharmD, BCPS Clinical  Pharmacist [phone] Please check AMION for all [place] numbers 05/24/2019

## 2019-05-24 NOTE — Progress Notes (Signed)
SLP Cancellation Note  Patient Details Name: Katherine Miranda MRN: [id number] DOB: 1920/06/07   Cancelled treatment:       Reason Eval/Treat Not Completed: Fatigue/lethargy limiting ability to participate; despite various attempts to wake pt, SLE could not be completed d/t decreased level of alertness.  ST will attempt at later date as pt's mentation improves.   Elvina Sidle, M.S., CCC-SLP 05/24/2019, 1:59 PM

## 2019-05-24 NOTE — Progress Notes (Signed)
PROGRESS NOTE    Katherine Miranda  [id number] DOB: 1921/04/07 DOA: 05/22/2019 PCP: [name], MD   Brief Narrative: Katherine Miranda is a 85 y.o. female with a Past Medical History of advanced dementia, atrial fibrillation, hypertension, hypothyroidism who presents with altered mental status. She was found to have an acute/subacute stroke with multiple infarcts in addition to AKI. Also concern for possible UTI started on IV Ceftriaxone.   Assessment & Plan:   Principal Problem:   Acute encephalopathy Active Problems:   Dementia (HCC)   Atrial fibrillation, chronic (HCC)   Hypothyroidism (acquired)   UTI (urinary tract infection)   Hypertensive urgency   DNR (do not resuscitate)   Acute embolic stroke ([place])   Acute encephalopathy Possibly related to UTI but according to daughter, she has had this for weeks with multiple trials at treatment and has not improved. Currently unchanged. Patient also with multiple acute/subacute infarcts and concern for possible seizures secondary to abnormal EEG. Long discussion with daughter this afternoon with regard to goals of care. Will let Katherine Miranda declare herself over the next day. Hopefully she will improve enough to where she will be safe enough to eat by mouth. Discussed possible transition to comfort care if things do not improved. Daughter will speak with her siblings and hopefully have a conference with palliative care tomorrow. -Continue Ceftriaxone -Continue Keppra  Acute stroke MRI significant for acute/subacute infarcts of multiple vascular territories concerning for embolic stroke with greatest involvement of left ACA territory. Transthoracic Echocardiogram significant for no emboli source. Hemoglobin A1C of 6.3%, LDL of 121. -Neurology recommendations: no recommendation for aggressive workup at this time  AKI Secondary to decreased oral intake. Started on IV fluids. Creatinine of 1.57 on admission and currently back to  baseline. AKI resolved.  UTI As mentioned above, multiple attempts at treatment. Urine culture significant for multiple species.  Essential hypertension -Continue hydralazine IV prn while NPO  Hypothyroidism -Continue Synthroid IV while NPO  History of atrial fibrillation Currently in sinus rhythm. On Eliquis and diltiazem as an outpatient -Continue Heparin drip while undergoing goals of care discussions and while NPO  Dementia Mental status worsened as mentioned above. Baseline function is poor but she was eating and was wheelchair bound. She was also verbal and alert.   DVT prophylaxis: Heparin drip Code Status:   Code Status: DNR Family Communication: Daughter at bedside Disposition Plan: Discharge pending continued goals of care and/or improvement of patient's mental status   Consultants:   Neurology  Palliative care  Procedures:   12/22: Transthoracic Echocardiogram IMPRESSIONS    1. Left ventricular ejection fraction, by visual estimation, is 60 to 65%. The left ventricle has normal function. There is moderately increased left ventricular hypertrophy.  2. Left ventricular diastolic function could not be evaluated.  3. The left ventricle has no regional wall motion abnormalities.  4. Global right ventricle has normal systolic function.The right ventricular size is normal. No increase in right ventricular wall thickness.  5. Left atrial size was mildly dilated.  6. Right atrial size was normal.  7. Small pericardial effusion.  8. The pericardial effusion is circumferential.  9. The mitral valve is normal in structure. No evidence of mitral valve regurgitation. 10. The tricuspid valve is normal in structure. Tricuspid valve regurgitation is not demonstrated. 11. The aortic valve is normal in structure. Aortic valve regurgitation is not visualized. 12. The pulmonic valve was grossly normal. Pulmonic valve regurgitation is not visualized.   12/22:  EEG DESCRIPTION: EEG  showed continuous generalized 3-5Hz  theta-delta slowing admixed with 13-15hz  generalized bet activity, maximal frontocentral. Spikes were seen frequently in left occipita region, maximal O1. Hyperventilation and photic stimulation were not performed.  ABNORMALITY - Continuous slow, generalized - Spikes, left occipital  IMPRESSION: This study showed evidence of potential epileptogenicity in left occipital region. Additionally there is evidence of severe diffuse encephalopathy, non specific to etiology. No seizures were seen throughout the recording.  Antimicrobials:  Ceftriaxone    Subjective: Mental status prohibits history  Objective: Vitals:   05/23/19 2338 05/24/19 0400 05/24/19 0700 05/24/19 0816  BP: (!) 144/80 138/88  116/68  Pulse: 83 91  95  Resp: 17 18 18 17   Temp: 97.8 F (36.6 C)   98.2 F (36.8 C)  TempSrc: Oral   Oral  SpO2: 98% 97%  96%    Intake/Output Summary (Last 24 hours) at 05/24/2019 [id number] Last data filed at 05/24/2019 0300 Gross per 24 hour  Intake 200 ml  Output 0 ml  Net 200 ml   There were no vitals filed for this visit.  Examination:  General exam: Appears calm and comfortable Respiratory system: Clear to auscultation. Respiratory effort normal. Cardiovascular system: S1 & S2 heard, RRR. No murmurs, rubs, gallops or clicks. Gastrointestinal system: Abdomen is nondistended, soft and nontender. No organomegaly or masses felt. Normal bowel sounds heard. Central nervous system: Obtunded. Withdraws to painful stimuli. Extremities: 1-2+ UE edema. No calf tenderness Skin: No cyanosis. No rashes Psychiatry: Judgement and insight appear impaired.    Data Reviewed: I have personally reviewed following labs and imaging studies  CBC: Recent Labs  Lab 05/22/19 1652 05/22/19 1751 05/23/19 0152  WBC 9.8  --  10.5  NEUTROABS 7.6  --   --   HGB 14.0 13.9 13.1  HCT 43.7 41.0 39.1  MCV 91.0  --  89.3  PLT 234  --  [id number]    Basic Metabolic Panel: Recent Labs  Lab 05/22/19 1652 05/22/19 1751 05/23/19 0152 05/23/19 0620 05/24/19 0745  NA 139 139 142 143 144  K 4.9 4.3 4.3 4.4 3.4*  CL 105  --  106 106 111  CO2 23  --  23 24 24   GLUCOSE 142*  --  134* 133* 119*  BUN 29*  --  28* 26* 18  CREATININE 1.57*  --  1.22* 1.17* 0.86  CALCIUM 9.2  --  8.9 8.9 8.8*   GFR: CrCl cannot be calculated (Unknown ideal weight.). Liver Function Tests: Recent Labs  Lab 05/22/19 1652  AST 28  ALT 13  ALKPHOS 110  BILITOT 1.2  PROT 6.8  ALBUMIN 3.4*   No results for input(s): LIPASE, AMYLASE in the last 168 hours. Recent Labs  Lab 05/22/19 1652 05/23/19 0152  AMMONIA 24 15   Coagulation Profile: No results for input(s): INR, PROTIME in the last 168 hours. Cardiac Enzymes: No results for input(s): CKTOTAL, CKMB, CKMBINDEX, TROPONINI in the last 168 hours. BNP (last 3 results) No results for input(s): PROBNP in the last 8760 hours. HbA1C: Recent Labs    05/24/19 0745  HGBA1C 6.3*   CBG: Recent Labs  Lab 05/23/19 0147 05/23/19 0658 05/23/19 1139 05/23/19 1658 05/24/19 0621  GLUCAP 118* 111* 126* 119* 132*   Lipid Profile: Recent Labs    05/24/19 0745  CHOL 193  HDL 44  LDLCALC 121*  TRIG 140  CHOLHDL 4.4   Thyroid Function Tests: Recent Labs    05/22/19 1743  TSH 2.553   Anemia Panel: No results for  input(s): VITAMINB12, FOLATE, FERRITIN, TIBC, IRON, RETICCTPCT in the last 72 hours. Sepsis Labs: Recent Labs  Lab 05/22/19 1652  LATICACIDVEN 1.7    Recent Results (from the past 240 hour(s))  SARS CORONAVIRUS 2 (TAT 6-24 HRS) Nasopharyngeal Nasopharyngeal Swab     Status: None   Collection Time: 05/22/19  7:46 PM   Specimen: Nasopharyngeal Swab  Result Value Ref Range Status   SARS Coronavirus 2 NEGATIVE NEGATIVE Final    Comment: (NOTE) SARS-CoV-2 target nucleic acids are NOT DETECTED. The SARS-CoV-2 RNA is generally detectable in upper and lower respiratory specimens  during the acute phase of infection. Negative results do not preclude SARS-CoV-2 infection, do not rule out co-infections with other pathogens, and should not be used as the sole basis for treatment or other patient management decisions. Negative results must be combined with clinical observations, patient history, and epidemiological information. The expected result is Negative. Fact Sheet for Patients: SugarRoll.be Fact Sheet for Healthcare Providers: https://www.woods-mathews.com/ This test is not yet approved or cleared by the [place] FDA and  has been authorized for detection and/or diagnosis of SARS-CoV-2 by FDA under an Emergency Use Authorization (EUA). This EUA will remain  in effect (meaning this test can be used) for the duration of the COVID-19 declaration under Section 56 4(b)(1) of the Act, 21 U.S.C. section 360bbb-3(b)(1), unless the authorization is terminated or revoked sooner. Performed at [facility], [place] [address], [place], [place] [address]          Radiology Studies: EEG  Result Date: 05/23/2019 Lora Havens, MD     05/23/2019  6:36 PM Patient Name: Katherine Miranda MRN: [id number] Epilepsy Attending: Lora Havens Referring Physician/Provider: Dr Gean Birchwood Date: 05/23/2019 Duration: 24.11 mins Patient history: 85yo F with ams. EEG to evaluate for seizure. Level of alertness: comatose AEDs during EEG study: None Technical aspects: This EEG study was done with scalp electrodes positioned according to the 10-20 International system of electrode placement. Electrical activity was acquired at a sampling rate of 500Hz  and reviewed with a high frequency filter of 70Hz  and a low frequency filter of 1Hz . EEG data were recorded continuously and digitally stored. DESCRIPTION: EEG showed continuous generalized 3-5Hz  theta-delta slowing admixed with 13-15hz  generalized bet activity, maximal frontocentral.  Spikes were seen frequently in left occipita region, maximal O1. Hyperventilation and photic stimulation were not performed. ABNORMALITY - Continuous slow, generalized - Spikes, left occipital IMPRESSION: This study showed evidence of potential epileptogenicity in left occipital region. Additionally there is evidence of severe diffuse encephalopathy, non specific to etiology. No seizures were seen throughout the recording. Lora Havens   DG Abd 1 View  Result Date: 05/23/2019 CLINICAL DATA:  Acute encephalopathy EXAM: ABDOMEN - 1 VIEW COMPARISON:  None. FINDINGS: The bowel gas pattern is normal. There is a moderate to large amount of rectal stool. No radio-opaque calculi or other significant radiographic abnormality are seen. Overlying spinal fixation hardware seen. IMPRESSION: Nonobstructive bowel gas pattern. Moderate to large amount of rectal stool. Electronically Signed   By: Prudencio Pair M.D.   On: 05/23/2019 00:20   CT HEAD WO CONTRAST  Result Date: 05/22/2019 CLINICAL DATA:  Rule out cerebral hemorrhage EXAM: CT HEAD WITHOUT CONTRAST TECHNIQUE: Contiguous axial images were obtained from the base of the skull through the vertex without intravenous contrast. COMPARISON:  CT head 04/20/2017 FINDINGS: Brain: Negative for acute hemorrhage. Moderate to advanced atrophy with ventricular enlargement which is stable. Advanced chronic microvascular ischemic changes throughout  the white matter. Chronic infarct right inferior occipital lobe which has developed since the prior study. Negative for acute infarct or mass. Vascular: Negative for hyperdense vessel Skull: Negative Sinuses/Orbits: Paranasal sinuses clear. Bilateral cataract surgery. No orbital lesion. Other: None IMPRESSION: Moderate to advanced atrophy. Advanced chronic microvascular ischemic change in the white matter. Chronic infarct right inferior occipital lobe. Negative for acute infarct or hemorrhage. Electronically Signed   By: Franchot Gallo M.D.   On: 05/22/2019 17:29   MR BRAIN WO CONTRAST  Result Date: 05/23/2019 CLINICAL DATA:  Acute encephalopathy EXAM: MRI HEAD WITHOUT CONTRAST TECHNIQUE: Multiplanar, multiecho pulse sequences of the brain and surrounding structures were obtained without intravenous contrast. COMPARISON:  None. FINDINGS: Brain: There is variable reduced diffusion in the parasagittal left frontoparietal lobes as well as the left pons and parasagittal right occipital lobe. There is a chronic right occipital infarct. Additional small chronic infarct of the left caudate. Additional confluent T2 hyperintensity in the supratentorial and pontine white matter likely reflects advanced chronic microvascular ischemic changes. Prominence of the ventricles and sulci reflects generalized parenchymal volume loss. There is disproportionate prominence of the ventricles likely reflecting central volume loss rather than hydrocephalus. There is no intracranial hemorrhage. There is no intracranial mass or significant mass effect. Vascular: Major vessel flow voids at the skull base are preserved. Skull and upper cervical spine: Normal marrow signal is preserved. Sinuses/Orbits: Mild mucosal thickening.  Orbits are unremarkable. Other: Sella is unremarkable.  Mastoid air cells are clear. IMPRESSION: Acute to subacute infarcts involving different vascular territories suggesting central embolic source. Greatest involvement is of the left ACA territory. Advanced chronic microvascular ischemic changes, chronic infarcts, and parenchymal volume loss as described above. Electronically Signed   By: Macy Mis M.D.   On: 05/23/2019 11:35   DG Chest Port 1 View  Result Date: 05/22/2019 CLINICAL DATA:  Altered mental status. EXAM: PORTABLE CHEST 1 VIEW COMPARISON:  04/20/2017. FINDINGS: Mediastinum and hilar structures normal. Heart size normal. No focal infiltrate. No pleural effusion or pneumothorax. Interposition of the colon right  hemidiaphragm again noted. Thoracic spine scoliosis concave left. Diffuse degenerative change. IMPRESSION: No acute cardiopulmonary disease. Electronically Signed   By: Marcello Moores  Register   On: 05/22/2019 17:17   ECHOCARDIOGRAM COMPLETE  Result Date: 05/23/2019   ECHOCARDIOGRAM REPORT   Patient Name:   Katherine Miranda Date of Exam: 05/23/2019 Medical Rec #:  [id number]        Height:       62.0 in Accession #:    [id number]       Weight:       151.5 lb Date of Birth:  28-Oct-1920       BSA:          1.70 m Patient Age:    45 years         BP:           124/78 mmHg Patient Gender: F                HR:           59 bpm. Exam Location:  Inpatient Procedure: 2D Echo Indications:    Stroke 434.91 / I163.9  History:        Patient has no prior history of Echocardiogram examinations.                 Arrythmias:Atrial Fibrillation; Risk Factors:Hypertension.  Dementia. decreased responsiveness.  Sonographer:    Darlina Sicilian RDCS Referring Phys: [place]  1. Left ventricular ejection fraction, by visual estimation, is 60 to 65%. The left ventricle has normal function. There is moderately increased left ventricular hypertrophy.  2. Left ventricular diastolic function could not be evaluated.  3. The left ventricle has no regional wall motion abnormalities.  4. Global right ventricle has normal systolic function.The right ventricular size is normal. No increase in right ventricular wall thickness.  5. Left atrial size was mildly dilated.  6. Right atrial size was normal.  7. Small pericardial effusion.  8. The pericardial effusion is circumferential.  9. The mitral valve is normal in structure. No evidence of mitral valve regurgitation. 10. The tricuspid valve is normal in structure. Tricuspid valve regurgitation is not demonstrated. 11. The aortic valve is normal in structure. Aortic valve regurgitation is not visualized. 12. The pulmonic valve was grossly normal. Pulmonic valve  regurgitation is not visualized. FINDINGS  Left Ventricle: Left ventricular ejection fraction, by visual estimation, is 60 to 65%. The left ventricle has normal function. The left ventricle has no regional wall motion abnormalities. The left ventricular internal cavity size was the left ventricle is normal in size. There is moderately increased left ventricular hypertrophy. Concentric left ventricular hypertrophy. The left ventricular diastology could not be evaluated due to atrial fibrillation. Left ventricular diastolic function could  not be evaluated. Right Ventricle: The right ventricular size is normal. No increase in right ventricular wall thickness. Global RV systolic function is has normal systolic function. Left Atrium: Left atrial size was mildly dilated. Right Atrium: Right atrial size was normal in size Pericardium: A small pericardial effusion is present. The pericardial effusion is circumferential. Mitral Valve: The mitral valve is normal in structure. No evidence of mitral valve regurgitation. Tricuspid Valve: The tricuspid valve is normal in structure. Tricuspid valve regurgitation is not demonstrated. Aortic Valve: The aortic valve is normal in structure. Aortic valve regurgitation is not visualized. Pulmonic Valve: The pulmonic valve was grossly normal. Pulmonic valve regurgitation is not visualized. Pulmonic regurgitation is not visualized. Aorta: The aortic root and ascending aorta are structurally normal, with no evidence of dilitation. IAS/Shunts: No atrial level shunt detected by color flow Doppler.  LEFT VENTRICLE PLAX 2D LVIDd:         3.31 cm  Diastology LVIDs:         2.16 cm  LV e' lateral:   2.94 cm/s LV PW:         1.38 cm  LV E/e' lateral: 21.9 LV IVS:        1.41 cm  LV e' medial:    2.50 cm/s LVOT diam:     2.00 cm  LV E/e' medial:  25.7 LV SV:         29 ml LV SV Index:   16.53 LVOT Area:     3.14 cm  LEFT ATRIUM             Index LA diam:        3.70 cm 2.18 cm/m LA Vol (A2C):    36.0 ml 21.19 ml/m LA Vol (A4C):   60.7 ml 35.73 ml/m LA Biplane Vol: 45.8 ml 26.96 ml/m  AORTIC VALVE LVOT Vmax:   78.30 cm/s LVOT Vmean:  50.600 cm/s LVOT VTI:    0.169 m  AORTA Ao Root diam: 3.10 cm Ao Asc diam:  3.30 cm MITRAL VALVE MV Area (PHT): 2.48 cm  SHUNTS MV PHT:        88.74 msec            Systemic VTI:  0.17 m MV Decel Time: 306 msec              Systemic Diam: 2.00 cm MV E velocity: 64.30 cm/s  103 cm/s MV A velocity: 123.00 cm/s 70.3 cm/s MV E/A ratio:  0.52        1.5  Mihai Croitoru MD Electronically signed by Sanda Klein MD Signature Date/Time: 05/23/2019/4:55:23 PM    Final         Scheduled Meds: . latanoprost  1 drop Both Eyes QHS   Continuous Infusions: . cefTRIAXone (ROCEPHIN)  IV Stopped (05/23/19 1712)  . heparin 450 Units/hr (05/23/19 2249)  . levETIRAcetam 250 mg (05/24/19 0914)     LOS: 1 day     Cordelia Poche, MD [facility] 05/24/2019, 9:58 AM  If 7PM-7AM, please contact night-coverage [email or web address]

## 2019-05-24 NOTE — Progress Notes (Signed)
ANTICOAGULATION CONSULT NOTE   Pharmacy Consult for Heparin  Indication: atrial fibrillation  No Known Allergies  Wt: 68 kg (2018) Ht: 62 inches IBW 50 kg   Vital Signs: Temp: 98.2 F (36.8 C) (12/23 0816) Temp Source: Oral (12/23 0816) BP: 116/68 (12/23 0816) Pulse Rate: 95 (12/23 0816)  Labs: Recent Labs    05/22/19 1652 05/22/19 1751 05/23/19 0152 05/23/19 0620 05/23/19 0915 05/23/19 2138 05/24/19 0745  HGB 14.0 13.9 13.1  --   --   --   --   HCT 43.7 41.0 39.1  --   --   --   --   PLT 234  --  198  --   --   --   --   APTT  --   --   --   --  152* 132* 43*  HEPARINUNFRC  --   --   --   --  >2.20*  --  >2.20*  CREATININE 1.57*  --  1.22* 1.17*  --   --  0.86    CrCl cannot be calculated (Unknown ideal weight.).   Medical History: Past Medical History:  Diagnosis Date  . A-fib ([place])   . Arthritis   . Dementia ([place])   . Hypertension     Assessment: 85 y/o F from a nursing facility with altered mental status. On apixaban PTA for afib. Pharmacy consulted to bridge with Heparin while NPO.    MRI 12/22 confirmed acute CVA so will aim for lower goal. Pt did not receive tPA, secondary to being on Eliquis. Last dose of Eliquis was 12/21 in the morning. Her heparin level and aPTT have fluctuated quite a bit, we have yet to find a rate of heparin that is therapeutic for Katherine Miranda. Today her HL is elevated, a lab effect from Eliquis. I will disregard this and use aPTT until Eliquis is washed out. Her aPTT is subtherapeutic this morning.    Goal of Therapy:  Heparin level 0.3-0.7 units/ml aPTT 66-102 seconds Monitor platelets by anticoagulation protocol: Yes    Plan:  - Increase heparin drip to 550 units/hr - Daily HL, CBC, aPTT - Check aPTT - Need current weight to ensure dosing appropriately    Katherine Miranda 05/24/2019 9:38 AM

## 2019-05-25 DIAGNOSIS — Z515 Encounter for palliative care: Secondary | ICD-10-CM

## 2019-05-25 LAB — APTT: aPTT: 71 seconds — ABNORMAL HIGH (ref 24–36)

## 2019-05-25 LAB — CBC
HCT: 37.4 % (ref 36.0–46.0)
Hemoglobin: 12 g/dL (ref 12.0–15.0)
MCH: 29.3 pg (ref 26.0–34.0)
MCHC: 32.1 g/dL (ref 30.0–36.0)
MCV: 91.2 fL (ref 80.0–100.0)
Platelets: 217 10*3/uL (ref 150–400)
RBC: 4.1 MIL/uL (ref 3.87–5.11)
RDW: 16 % — ABNORMAL HIGH (ref 11.5–15.5)
WBC: 7 10*3/uL (ref 4.0–10.5)
nRBC: 0 % (ref 0.0–0.2)

## 2019-05-25 LAB — HEPARIN LEVEL (UNFRACTIONATED): Heparin Unfractionated: >2.2 IU/mL — ABNORMAL HIGH (ref 0.30–0.70)

## 2019-05-25 LAB — GLUCOSE, CAPILLARY: Glucose-Capillary: 97 mg/dL (ref 70–99)

## 2019-05-25 MED ORDER — LORAZEPAM BOLUS VIA INFUSION: 0.5000 mg | INTRAVENOUS | Status: DC | PRN

## 2019-05-25 MED ORDER — HYDROMORPHONE HCL 1 MG/ML IJ SOLN: 0.5000 mg | INTRAMUSCULAR | Status: DC | PRN

## 2019-05-25 MED ORDER — BISACODYL 10 MG RE SUPP
10.0000 mg | Freq: Every day | RECTAL | Status: DC | PRN
  Administered 2019-05-27: 10 mg via RECTAL
  Filled 2019-05-25: qty 1

## 2019-05-25 MED ORDER — LORAZEPAM 2 MG/ML IJ SOLN: 0.5000 mg | INTRAMUSCULAR | Status: DC | PRN

## 2019-05-25 MED ORDER — GLYCOPYRROLATE 0.2 MG/ML IJ SOLN
0.4000 mg | INTRAMUSCULAR | Status: DC
  Administered 2019-05-25 – 2019-05-26 (×4): 0.4 mg via INTRAVENOUS
  Filled 2019-05-25 (×5): qty 2

## 2019-05-25 NOTE — Progress Notes (Signed)
Pt more alert this am, able to say good morning with prompting. Exudate removed from eyelids and she is opening them independently. Says a few words.there is a blister cluster abover her right eye that is questionable for shingles, fluid filled but not ruptured. Will pass on in report to inform MD

## 2019-05-25 NOTE — Progress Notes (Signed)
STROKE TEAM PROGRESS NOTE       INTERVAL HISTORY No one is at the bedside. She remains lethargic, poorly responsive. Prognosis is poor. Recommend palliative care .    OBJECTIVE Vitals:   05/25/19 0036 05/25/19 0328 05/25/19 0810 05/25/19 0909  BP: (!) 167/90 (!) 179/97 (!) 138/98   Pulse: 75 65 67   Resp: 16  18   Temp: (!) 97.4 F (36.3 C) 98 F (36.7 C) 97.7 F (36.5 C)   TempSrc: Oral Axillary Axillary   SpO2: 98% 100% 100% 100%  Weight:        CBC:  Recent Labs  Lab 05/22/19 1652 05/23/19 0152 05/25/19 0138  WBC 9.8 10.5 7.0  NEUTROABS 7.6  --   --   HGB 14.0 13.1 12.0  HCT 43.7 39.1 37.4  MCV 91.0 89.3 91.2  PLT 234 198 [id number]    Basic Metabolic Panel:  Recent Labs  Lab 05/23/19 0620 05/24/19 0745  NA 143 144  K 4.4 3.4*  CL 106 111  CO2 24 24  GLUCOSE 133* 119*  BUN 26* 18  CREATININE 1.17* 0.86  CALCIUM 8.9 8.8*    Lipid Panel:     Component Value Date/Time   CHOL 193 05/24/2019 0745   TRIG 140 05/24/2019 0745   HDL 44 05/24/2019 0745   CHOLHDL 4.4 05/24/2019 0745   VLDL 28 05/24/2019 0745   LDLCALC 121 (H) 05/24/2019 0745   HgbA1c:  Lab Results  Component Value Date   HGBA1C 6.3 (H) 05/24/2019   Urine Drug Screen:     Component Value Date/Time   LABOPIA NONE DETECTED 05/22/2019 1819   COCAINSCRNUR NONE DETECTED 05/22/2019 1819   LABBENZ NONE DETECTED 05/22/2019 1819   AMPHETMU NONE DETECTED 05/22/2019 1819   THCU NONE DETECTED 05/22/2019 1819   LABBARB NONE DETECTED 05/22/2019 1819    Alcohol Level     Component Value Date/Time   ETH <11 10/06/2012 2340    IMAGING   EEG  Result Date: 05/23/2019 Lora Havens, MD     05/23/2019  6:36 PM Patient Name: Katherine Miranda MRN: [id number] Epilepsy Attending: Lora Havens Referring Physician/Provider: Dr Gean Birchwood Date: 05/23/2019 Duration: 24.11 mins Patient history: 85yo F with ams. EEG to evaluate for seizure. Level of alertness: comatose AEDs during EEG study:  None Technical aspects: This EEG study was done with scalp electrodes positioned according to the 10-20 International system of electrode placement. Electrical activity was acquired at a sampling rate of 500Hz  and reviewed with a high frequency filter of 70Hz  and a low frequency filter of 1Hz . EEG data were recorded continuously and digitally stored. DESCRIPTION: EEG showed continuous generalized 3-5Hz  theta-delta slowing admixed with 13-15hz  generalized bet activity, maximal frontocentral. Spikes were seen frequently in left occipita region, maximal O1. Hyperventilation and photic stimulation were not performed. ABNORMALITY - Continuous slow, generalized - Spikes, left occipital IMPRESSION: This study showed evidence of potential epileptogenicity in left occipital region. Additionally there is evidence of severe diffuse encephalopathy, non specific to etiology. No seizures were seen throughout the recording. Lora Havens   ECHOCARDIOGRAM COMPLETE  Result Date: 05/23/2019   ECHOCARDIOGRAM REPORT   Patient Name:   Katherine Miranda Date of Exam: 05/23/2019 Medical Rec #:  [id number]        Height:       62.0 in Accession #:    [id number]       Weight:       151.5 lb Date of Birth:  08-06-1920       BSA:          1.70 m Patient Age:    64 years         BP:           124/78 mmHg Patient Gender: F                HR:           59 bpm. Exam Location:  Inpatient Procedure: 2D Echo Indications:    Stroke 434.91 / I163.9  History:        Patient has no prior history of Echocardiogram examinations.                 Arrythmias:Atrial Fibrillation; Risk Factors:Hypertension.                 Dementia. decreased responsiveness.  Sonographer:    Darlina Sicilian RDCS Referring Phys: [place]  1. Left ventricular ejection fraction, by visual estimation, is 60 to 65%. The left ventricle has normal function. There is moderately increased left ventricular hypertrophy.  2. Left ventricular diastolic  function could not be evaluated.  3. The left ventricle has no regional wall motion abnormalities.  4. Global right ventricle has normal systolic function.The right ventricular size is normal. No increase in right ventricular wall thickness.  5. Left atrial size was mildly dilated.  6. Right atrial size was normal.  7. Small pericardial effusion.  8. The pericardial effusion is circumferential.  9. The mitral valve is normal in structure. No evidence of mitral valve regurgitation. 10. The tricuspid valve is normal in structure. Tricuspid valve regurgitation is not demonstrated. 11. The aortic valve is normal in structure. Aortic valve regurgitation is not visualized. 12. The pulmonic valve was grossly normal. Pulmonic valve regurgitation is not visualized. FINDINGS  Left Ventricle: Left ventricular ejection fraction, by visual estimation, is 60 to 65%. The left ventricle has normal function. The left ventricle has no regional wall motion abnormalities. The left ventricular internal cavity size was the left ventricle is normal in size. There is moderately increased left ventricular hypertrophy. Concentric left ventricular hypertrophy. The left ventricular diastology could not be evaluated due to atrial fibrillation. Left ventricular diastolic function could  not be evaluated. Right Ventricle: The right ventricular size is normal. No increase in right ventricular wall thickness. Global RV systolic function is has normal systolic function. Left Atrium: Left atrial size was mildly dilated. Right Atrium: Right atrial size was normal in size Pericardium: A small pericardial effusion is present. The pericardial effusion is circumferential. Mitral Valve: The mitral valve is normal in structure. No evidence of mitral valve regurgitation. Tricuspid Valve: The tricuspid valve is normal in structure. Tricuspid valve regurgitation is not demonstrated. Aortic Valve: The aortic valve is normal in structure. Aortic valve  regurgitation is not visualized. Pulmonic Valve: The pulmonic valve was grossly normal. Pulmonic valve regurgitation is not visualized. Pulmonic regurgitation is not visualized. Aorta: The aortic root and ascending aorta are structurally normal, with no evidence of dilitation. IAS/Shunts: No atrial level shunt detected by color flow Doppler.  LEFT VENTRICLE PLAX 2D LVIDd:         3.31 cm  Diastology LVIDs:         2.16 cm  LV e' lateral:   2.94 cm/s LV PW:         1.38 cm  LV E/e' lateral: 21.9 LV IVS:        1.41 cm  LV e' medial:    2.50 cm/s LVOT diam:     2.00 cm  LV E/e' medial:  25.7 LV SV:         29 ml LV SV Index:   16.53 LVOT Area:     3.14 cm  LEFT ATRIUM             Index LA diam:        3.70 cm 2.18 cm/m LA Vol (A2C):   36.0 ml 21.19 ml/m LA Vol (A4C):   60.7 ml 35.73 ml/m LA Biplane Vol: 45.8 ml 26.96 ml/m  AORTIC VALVE LVOT Vmax:   78.30 cm/s LVOT Vmean:  50.600 cm/s LVOT VTI:    0.169 m  AORTA Ao Root diam: 3.10 cm Ao Asc diam:  3.30 cm MITRAL VALVE MV Area (PHT): 2.48 cm              SHUNTS MV PHT:        88.74 msec            Systemic VTI:  0.17 m MV Decel Time: 306 msec              Systemic Diam: 2.00 cm MV E velocity: 64.30 cm/s  103 cm/s MV A velocity: 123.00 cm/s 70.3 cm/s MV E/A ratio:  0.52        1.5  Mihai Croitoru MD Electronically signed by Sanda Klein MD Signature Date/Time: 05/23/2019/4:55:23 PM    Final     ECG - Afib. (See cardiology reading for complete details)   EEG This study showed evidence of potential epileptogenicity in left occipital region. Additionally there is evidence of severe diffuse encephalopathy, non specific to etiology. No seizures were seen throughout the recording.   PHYSICAL EXAM Blood pressure (!) 138/98, pulse 67, temperature 97.7 F (36.5 C), temperature source Axillary, resp. rate 18, weight 58.1 kg, SpO2 100 %.  GENERAL: fail, malnourished looking elderly lady, mild distress HENT: - Normocephalic and atraumatic, dry Eyes: normal  conjunctiva LUNGS - Clear to auscultation bilaterally with no wheezes CV - S1S2 RRR, no m/r/g, equal pulses bilaterally. ABDOMEN - Soft, nontender, nondistended with normoactive BS Ext: warm, well perfused, intact peripheral pulses, no edema, multiple bruises Psych: unable to assess  NEURO:  Mental Status: obtunded, does not respond to voice, does not open eyes to voice or noxious stimuli, grimaces to sternal rub, occasional moan to noxious stimuli.  She does not follow command, spontaneously moves upper extremities, appears right>left Language: Nonverbal  Cranial Nerves: PERRL 79mm/brisk. EOMI, visual fields unable to assess, no facial asymmetry, facial sensation intact, hearing intact, tongue/uvula/soft palate midline, normal sternocleidomastoid and trapezius muscle strength. No evidence of tongue atrophy or fibrillations Motor: moves both upper extremities spontaneously antigravity, bilateral legs flicker to painful stimuli Tone: is normal and bulk is normal Sensation- appears to be intact to noxious stimuli Coordination: unable to assess Gait- deferred  HOME MEDICATIONS:  Medications Prior to Admission  Medication Sig Dispense Refill  . acetaminophen (TYLENOL) 500 MG tablet Take 500 mg by mouth every 4 (four) hours as needed (pain).    Marland Kitchen apixaban (ELIQUIS) 5 MG TABS tablet Take 5 mg by mouth 2 (two) times daily.    Marland Kitchen ascorbic acid (VITAMIN C) 500 MG tablet Take 1,000 mg by mouth daily. COVIDi prophylaxis    . b complex vitamins tablet Take 1 tablet by mouth daily.    . cholecalciferol (VITAMIN D) 1000 units tablet Take 1,000 Units daily by mouth.    Marland Kitchen  cloNIDine (CATAPRES) 0.1 MG tablet Take 0.1 mg by mouth every 8 (eight) hours as needed (SBP >170).    Marland Kitchen diltiazem (CARTIA XT) 120 MG 24 hr capsule Take 120 mg by mouth every evening.    . ivermectin (STROMECTOL) 3 MG TABS tablet Take 1.5 mg by mouth every Friday. For COVID prophylaxis    . levothyroxine (SYNTHROID) 50 MCG tablet Take 50  mcg by mouth daily before breakfast.    . lisinopril (ZESTRIL) 10 MG tablet Take 10 mg by mouth daily.    Marland Kitchen loratadine (CLARITIN) 10 MG tablet Take 10 mg by mouth daily as needed for allergies.    . nitroGLYCERIN (NITROSTAT) 0.4 MG SL tablet Place 0.4 mg under the tongue every 5 (five) minutes as needed for chest pain (if no relief in 15 minutes call MD/911).    . Probiotic Product (RISA-BID PROBIOTIC) TABS Take 1 tablet daily by mouth.    . senna (SENOKOT) 8.6 MG TABS tablet Take 1 tablet by mouth daily as needed (constipation).     . Travoprost, BAK Free, (TRAVATAN) 0.004 % SOLN ophthalmic solution Place 1 drop at bedtime into both eyes.    . Zinc 50 MG TABS Take 50 mg by mouth daily.        HOSPITAL MEDICATIONS:  . glycopyrrolate  0.4 mg Intravenous Q4H  . latanoprost  1 drop Both Eyes QHS    ALLERGIES No Known Allergies  ASSESSMENT/PLAN  Katherine Miranda is a 85 y.o. female with past medical history of moderate to severe dementia with sundowning, chronic A.Fib on Eliquis, HTN, hypothyroidism, wheelchair bound at baseline who presented to the [facility] ED from nursing facility due to decreased responsiveness.  She has a current UTI being treated with ABX. 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 LA dilation, LVH  Sars Corona Virus 2  neg  LDL - 121    Component Value Date/Time   LDLCALC 121 (H) 05/24/2019 0745    HgbA1c - 6.3  UDS neg  VTE prophylaxis - scd Diet  Diet Order            Diet regular Room service  appropriate? No; Fluid consistency: Thin  Diet effective now              Eliquis prior to admission, now on Eliquis  Unable to counsel Patient  to be compliant with her antithrombotic medications  Ongoing aggressive stroke risk factor management  Therapy recommendations:  pending  Disposition:  Pending  Hypertension  Home BP meds: Clonidine, diltiazem, ivermectin, zestril  Current BP meds: none   Stable . Permissive hypertension (OK if < 220/120) but gradually normalize in 5-7 days  . Long-term BP goal normotensive  Hyperlipidemia  Home Lipid lowering medication: none   LDL 124, goal < 70 Current lipid lowering medication:  None d/t DNR/DNI  Diabetes  Home diabetic meds: none   Current diabetic meds: SSI   HgbA1c6.3 , goal < 7.0 Recent Labs    05/23/19 1658 05/24/19  [id number] 05/25/19 0635  GLUCAP 119* 132* 97    Other Stroke Risk Factors  Advanced age  Hx stroke/TIA  Family hx stroke  Congestive Heart Failure  Atrial fibrillation   Other Active Problems  Dementia w/sundowning  Chrnoic debility mRS4  Abnormal EEG with occp  Sharps- 250mg  Keppra started  UTI (h/o freq UTI)- IV Rocephin started   Recommend Palliative Care consult  Hospital day # 2     She presented with altered mental status and MRI scan shows multiple embolic strokes and she has known history of atrial fibrillation as well as baseline dementia.  Overall general medical condition is quite poor and do not recommend aggressive work-up.  Palliative care consult and comfort care measures would be appropriate. Stroke team will sign off. call for questionsg questions  Antony Contras, MD Medical Director [place] Pager: [phone] 05/25/2019 2:34 PM  To contact Stroke Continuity provider, please refer to [email or web address] After hours, contact General Neurology

## 2019-05-25 NOTE — Progress Notes (Signed)
ANTICOAGULATION CONSULT NOTE   Pharmacy Consult for Heparin  Indication: atrial fibrillation  No Known Allergies  Wt: 68 kg (2018) Ht: 62 inches IBW 50 kg   Vital Signs: Temp: 98 F (36.7 C) (12/24 0328) Temp Source: Axillary (12/24 0328) BP: 179/97 (12/24 0328) Pulse Rate: 65 (12/24 0328)  Labs: Recent Labs    05/22/19 1652 05/22/19 1751 05/22/19 1751 05/23/19 0152 05/23/19 0620 05/23/19 0915 05/24/19 0745 05/24/19 1738 05/25/19 0138  HGB 14.0 13.9  --  13.1  --   --   --   --  12.0  HCT 43.7 41.0  --  39.1  --   --   --   --  37.4  PLT 234  --   --  198  --   --   --   --  217  APTT  --   --    < >  --   --  152* 43* 76* 71*  HEPARINUNFRC  --   --   --   --   --  >2.20* >2.20*  --  >2.20*  CREATININE 1.57*  --   --  1.22* 1.17*  --  0.86  --   --    < > = values in this interval not displayed.    CrCl cannot be calculated (Unknown ideal weight.).   Medical History: Past Medical History:  Diagnosis Date  . A-fib ([place])   . Arthritis   . Dementia ([place])   . Hypertension     Assessment: 85 y/o F from a nursing facility with altered mental status. On apixaban PTA for afib. Pharmacy consulted to bridge with Heparin while NPO.    MRI 12/22 confirmed acute CVA so will aim for lower goal. Pt did not receive tPA, secondary to being on Eliquis. Last dose of Eliquis was 12/21 in the morning.  PTT therapeutic this morning CBC stable   Goal of Therapy:  Heparin level 0.3-0.5 units/ml aPTT 66-85 seconds Monitor platelets by anticoagulation protocol: Yes    Plan:  -Continue heparin 550 units/hr -Daily heparin level, PTT, CBC  Thank you Anette Guarneri, PharmD  Please check AMION for all [place] numbers 05/25/2019

## 2019-05-25 NOTE — Progress Notes (Signed)
OT Cancellation Note and Discharge  Patient Details Name: Katherine Miranda MRN: [id number] DOB: 09/24/1920   Cancelled Treatment:    Reason Eval/Treat Not Completed: Other (comment). Noted in chart it looks as though they are looking at residential services at [facility]. Spoke to pt's RN Anderson Malta) and she is under the understanding from pallative services that patient is going to be transitioned to comfort care she is just waiting on the orders. Based off of this OT will sign off.  [place], OTR/L Acute Rehab Services Pager [phone] Office [phone]     Almon Register 05/25/2019, 11:57 AM

## 2019-05-25 NOTE — Progress Notes (Addendum)
[place] received for family interest in [place]. Chart reviewed and eligibility has been confirmed. Attempted contact with daughter [name] at number in chart. Left voice message requesting call back. Note TOC manager is aware [facility] is not able to offer a room today.   Will continue to reach out to family.  Please do not hesitate to call with questions.   1500 ADDENDUM: Received call back from daughter [name]. Answered her questions related to visitation restrictions due to Covid-19. She is aware a hospital liaison will follow up over the weekend or sooner if a room becomes available.     Thank you,  Erling Conte, LCSW [phone]

## 2019-05-25 NOTE — Progress Notes (Signed)
SLP Cancellation Note  Patient Details Name: Katherine Miranda MRN: [id number] DOB: [birth year]   Cancelled treatment:       Reason Eval/Treat Not Completed: Fatigue/lethargy limiting ability to participate; SLP unable to rouse Pt despite multimodality cues. Discussed with MD and Pt likely to go with hospice to [facility] when bed is available. Consider d/c SLP orders if Pt not able to sustain alertness; consider comfort feeds if Pt becomes appropriate and goes with Hospice.  Thank you,  Genene Churn, [place]    [place] 05/25/2019, 11:46 AM

## 2019-05-25 NOTE — Progress Notes (Signed)
PROGRESS NOTE    PARRIE CRANGLE  [id number] DOB: 1921/04/20 DOA: 05/22/2019 PCP: [name], MD   Brief Narrative: Katherine Miranda is a 85 y.o. female with a Past Medical History of advanced dementia, atrial fibrillation, hypertension, hypothyroidism who presents with altered mental status. She was found to have an acute/subacute stroke with multiple infarcts in addition to AKI. Also concern for possible UTI started on IV Ceftriaxone.   Assessment & Plan:   Principal Problem:   Acute encephalopathy Active Problems:   Dementia (HCC)   Atrial fibrillation, chronic (HCC)   Hypothyroidism (acquired)   UTI (urinary tract infection)   Hypertensive urgency   DNR (do not resuscitate)   Acute embolic stroke ([place])   Goals of care, counseling/discussion   Palliative care encounter   Acute encephalopathy Possibly related to UTI but according to daughter, she has had this for weeks with multiple trials at treatment and has not improved. Currently unchanged. Patient also with multiple acute/subacute infarcts and concern for possible seizures secondary to abnormal EEG. Long discussion with daughter this afternoon with regard to goals of care. Will let Ms. Gashi declare herself over the next day. Hopefully she will improve enough to where she will be safe enough to eat by mouth. Discussed possible transition to comfort care if things do not improved. Palliative on board and after discussion with family, patient now made comfort care.  Acute stroke MRI significant for acute/subacute infarcts of multiple vascular territories concerning for embolic stroke with greatest involvement of left ACA territory. Transthoracic Echocardiogram significant for no emboli source. Hemoglobin A1C of 6.3%, LDL of 121. Comfort care.  AKI Secondary to decreased oral intake. Started on IV fluids. Creatinine of 1.57 on admission and currently back to baseline. AKI resolved.  UTI As mentioned above,  multiple attempts at treatment. Urine culture significant for multiple species.  Essential hypertension Managed with IV hydralazine as needed but is now comfort care.  Hypothyroidism Synthroid IV continued until patient transitioned to comfort care.  History of atrial fibrillation Currently in sinus rhythm. On Eliquis and diltiazem as an outpatient. Heparin drip used while inpatient and discontinued once patient made comfort care.  Dementia Mental status worsened as mentioned above. Baseline function is poor but she was eating and was wheelchair bound. She was also verbal and alert.   DVT prophylaxis: Heparin drip Code Status:   Code Status: DNR Family Communication: None Disposition Plan: Discharge to [facility] when bed available   Consultants:   Neurology  Palliative care  Procedures:   12/22: Transthoracic Echocardiogram IMPRESSIONS    1. Left ventricular ejection fraction, by visual estimation, is 60 to 65%. The left ventricle has normal function. There is moderately increased left ventricular hypertrophy.  2. Left ventricular diastolic function could not be evaluated.  3. The left ventricle has no regional wall motion abnormalities.  4. Global right ventricle has normal systolic function.The right ventricular size is normal. No increase in right ventricular wall thickness.  5. Left atrial size was mildly dilated.  6. Right atrial size was normal.  7. Small pericardial effusion.  8. The pericardial effusion is circumferential.  9. The mitral valve is normal in structure. No evidence of mitral valve regurgitation. 10. The tricuspid valve is normal in structure. Tricuspid valve regurgitation is not demonstrated. 11. The aortic valve is normal in structure. Aortic valve regurgitation is not visualized. 12. The pulmonic valve was grossly normal. Pulmonic valve regurgitation is not visualized.   12/22: EEG DESCRIPTION: EEG showed continuous  generalized 3-5Hz   theta-delta slowing admixed with 13-15hz  generalized bet activity, maximal frontocentral. Spikes were seen frequently in left occipita region, maximal O1. Hyperventilation and photic stimulation were not performed.  ABNORMALITY - Continuous slow, generalized - Spikes, left occipital  IMPRESSION: This study showed evidence of potential epileptogenicity in left occipital region. Additionally there is evidence of severe diffuse encephalopathy, non specific to etiology. No seizures were seen throughout the recording.  Antimicrobials:  Ceftriaxone    Subjective: Mental status prohibits history  Objective: Vitals:   05/25/19 0036 05/25/19 0328 05/25/19 0810 05/25/19 0909  BP: (!) 167/90 (!) 179/97 (!) 138/98   Pulse: 75 65 67   Resp: 16  18   Temp: (!) 97.4 F (36.3 C) 98 F (36.7 C) 97.7 F (36.5 C)   TempSrc: Oral Axillary Axillary   SpO2: 98% 100% 100% 100%  Weight:        Intake/Output Summary (Last 24 hours) at 05/25/2019 1341 Last data filed at 05/25/2019 1248 Gross per 24 hour  Intake 147.39 ml  Output 426 ml  Net -278.61 ml   Filed Weights   05/24/19 1000  Weight: 58.1 kg    Examination:  General exam: Appears calm and comfortable Respiratory system: Clear to auscultation. Respiratory effort normal. Cardiovascular system: S1 & S2 heard, RRR. No murmurs, rubs, gallops or clicks. Gastrointestinal system: Abdomen is nondistended, soft and nontender. No organomegaly or masses felt. Normal bowel sounds heard. Central nervous system: Obtunded Extremities: No edema. No calf tenderness Skin: No cyanosis. Scaly rash on right brow; no vesicles seen    Data Reviewed: I have personally reviewed following labs and imaging studies  CBC: Recent Labs  Lab 05/22/19 1652 05/22/19 1751 05/23/19 0152 05/25/19 0138  WBC 9.8  --  10.5 7.0  NEUTROABS 7.6  --   --   --   HGB 14.0 13.9 13.1 12.0  HCT 43.7 41.0 39.1 37.4  MCV 91.0  --  89.3 91.2  PLT 234  --  198 [id number]    Basic Metabolic Panel: Recent Labs  Lab 05/22/19 1652 05/22/19 1751 05/23/19 0152 05/23/19 0620 05/24/19 0745  NA 139 139 142 143 144  K 4.9 4.3 4.3 4.4 3.4*  CL 105  --  106 106 111  CO2 23  --  23 24 24   GLUCOSE 142*  --  134* 133* 119*  BUN 29*  --  28* 26* 18  CREATININE 1.57*  --  1.22* 1.17* 0.86  CALCIUM 9.2  --  8.9 8.9 8.8*   GFR: CrCl cannot be calculated (Unknown ideal weight.). Liver Function Tests: Recent Labs  Lab 05/22/19 1652  AST 28  ALT 13  ALKPHOS 110  BILITOT 1.2  PROT 6.8  ALBUMIN 3.4*   No results for input(s): LIPASE, AMYLASE in the last 168 hours. Recent Labs  Lab 05/22/19 1652 05/23/19 0152  AMMONIA 24 15   Coagulation Profile: No results for input(s): INR, PROTIME in the last 168 hours. Cardiac Enzymes: No results for input(s): CKTOTAL, CKMB, CKMBINDEX, TROPONINI in the last 168 hours. BNP (last 3 results) No results for input(s): PROBNP in the last 8760 hours. HbA1C: Recent Labs    05/24/19 0745  HGBA1C 6.3*   CBG: Recent Labs  Lab 05/23/19 0658 05/23/19 1139 05/23/19 1658 05/24/19 0621 05/25/19 0635  GLUCAP 111* 126* 119* 132* 97   Lipid Profile: Recent Labs    05/24/19 0745  CHOL 193  HDL 44  LDLCALC 121*  TRIG 140  CHOLHDL 4.4  Thyroid Function Tests: Recent Labs    05/22/19 1743  TSH 2.553   Anemia Panel: No results for input(s): VITAMINB12, FOLATE, FERRITIN, TIBC, IRON, RETICCTPCT in the last 72 hours. Sepsis Labs: Recent Labs  Lab 05/22/19 1652  LATICACIDVEN 1.7    Recent Results (from the past 240 hour(s))  Culture, Urine     Status: Abnormal   Collection Time: 05/22/19  6:19 PM   Specimen: Urine, Random  Result Value Ref Range Status   Specimen Description URINE, RANDOM  Final   Special Requests   Final    NONE Performed at [facility], [address], [place], [place] [address]    Culture MULTIPLE SPECIES PRESENT, SUGGEST RECOLLECTION (A)  Final   Report Status 05/24/2019  FINAL  Final  SARS CORONAVIRUS 2 (TAT 6-24 HRS) Nasopharyngeal Nasopharyngeal Swab     Status: None   Collection Time: 05/22/19  7:46 PM   Specimen: Nasopharyngeal Swab  Result Value Ref Range Status   SARS Coronavirus 2 NEGATIVE NEGATIVE Final    Comment: (NOTE) SARS-CoV-2 target nucleic acids are NOT DETECTED. The SARS-CoV-2 RNA is generally detectable in upper and lower respiratory specimens during the acute phase of infection. Negative results do not preclude SARS-CoV-2 infection, do not rule out co-infections with other pathogens, and should not be used as the sole basis for treatment or other patient management decisions. Negative results must be combined with clinical observations, patient history, and epidemiological information. The expected result is Negative. Fact Sheet for Patients: SugarRoll.be Fact Sheet for Healthcare Providers: https://www.woods-mathews.com/ This test is not yet approved or cleared by the [place] FDA and  has been authorized for detection and/or diagnosis of SARS-CoV-2 by FDA under an Emergency Use Authorization (EUA). This EUA will remain  in effect (meaning this test can be used) for the duration of the COVID-19 declaration under Section 56 4(b)(1) of the Act, 21 U.S.C. section 360bbb-3(b)(1), unless the authorization is terminated or revoked sooner. Performed at [facility], [place] [address] [place], [place] [address]          Radiology Studies: EEG  Result Date: 05/23/2019 Lora Havens, MD     05/23/2019  6:36 PM Patient Name: Katherine Miranda MRN: [id number] Epilepsy Attending: Lora Havens Referring Physician/Provider: Dr Gean Birchwood Date: 05/23/2019 Duration: 24.11 mins Patient history: 85yo F with ams. EEG to evaluate for seizure. Level of alertness: comatose AEDs during EEG study: None Technical aspects: This EEG study was done with scalp electrodes positioned according  to the 10-20 International system of electrode placement. Electrical activity was acquired at a sampling rate of 500Hz  and reviewed with a high frequency filter of 70Hz  and a low frequency filter of 1Hz . EEG data were recorded continuously and digitally stored. DESCRIPTION: EEG showed continuous generalized 3-5Hz  theta-delta slowing admixed with 13-15hz  generalized bet activity, maximal frontocentral. Spikes were seen frequently in left occipita region, maximal O1. Hyperventilation and photic stimulation were not performed. ABNORMALITY - Continuous slow, generalized - Spikes, left occipital IMPRESSION: This study showed evidence of potential epileptogenicity in left occipital region. Additionally there is evidence of severe diffuse encephalopathy, non specific to etiology. No seizures were seen throughout the recording. Lora Havens   ECHOCARDIOGRAM COMPLETE  Result Date: 05/23/2019   ECHOCARDIOGRAM REPORT   Patient Name:   KAYLEENA HEARNE Date of Exam: 05/23/2019 Medical Rec #:  [id number]        Height:       62.0 in Accession #:  [id number]       Weight:       151.5 lb Date of Birth:  01-24-1921       BSA:          1.70 m Patient Age:    32 years         BP:           124/78 mmHg Patient Gender: F                HR:           59 bpm. Exam Location:  Inpatient Procedure: 2D Echo Indications:    Stroke 434.91 / I163.9  History:        Patient has no prior history of Echocardiogram examinations.                 Arrythmias:Atrial Fibrillation; Risk Factors:Hypertension.                 Dementia. decreased responsiveness.  Sonographer:    Darlina Sicilian RDCS Referring Phys: [place]  1. Left ventricular ejection fraction, by visual estimation, is 60 to 65%. The left ventricle has normal function. There is moderately increased left ventricular hypertrophy.  2. Left ventricular diastolic function could not be evaluated.  3. The left ventricle has no regional wall motion abnormalities.   4. Global right ventricle has normal systolic function.The right ventricular size is normal. No increase in right ventricular wall thickness.  5. Left atrial size was mildly dilated.  6. Right atrial size was normal.  7. Small pericardial effusion.  8. The pericardial effusion is circumferential.  9. The mitral valve is normal in structure. No evidence of mitral valve regurgitation. 10. The tricuspid valve is normal in structure. Tricuspid valve regurgitation is not demonstrated. 11. The aortic valve is normal in structure. Aortic valve regurgitation is not visualized. 12. The pulmonic valve was grossly normal. Pulmonic valve regurgitation is not visualized. FINDINGS  Left Ventricle: Left ventricular ejection fraction, by visual estimation, is 60 to 65%. The left ventricle has normal function. The left ventricle has no regional wall motion abnormalities. The left ventricular internal cavity size was the left ventricle is normal in size. There is moderately increased left ventricular hypertrophy. Concentric left ventricular hypertrophy. The left ventricular diastology could not be evaluated due to atrial fibrillation. Left ventricular diastolic function could  not be evaluated. Right Ventricle: The right ventricular size is normal. No increase in right ventricular wall thickness. Global RV systolic function is has normal systolic function. Left Atrium: Left atrial size was mildly dilated. Right Atrium: Right atrial size was normal in size Pericardium: A small pericardial effusion is present. The pericardial effusion is circumferential. Mitral Valve: The mitral valve is normal in structure. No evidence of mitral valve regurgitation. Tricuspid Valve: The tricuspid valve is normal in structure. Tricuspid valve regurgitation is not demonstrated. Aortic Valve: The aortic valve is normal in structure. Aortic valve regurgitation is not visualized. Pulmonic Valve: The pulmonic valve was grossly normal. Pulmonic valve  regurgitation is not visualized. Pulmonic regurgitation is not visualized. Aorta: The aortic root and ascending aorta are structurally normal, with no evidence of dilitation. IAS/Shunts: No atrial level shunt detected by color flow Doppler.  LEFT VENTRICLE PLAX 2D LVIDd:         3.31 cm  Diastology LVIDs:         2.16 cm  LV e' lateral:   2.94 cm/s LV PW:  1.38 cm  LV E/e' lateral: 21.9 LV IVS:        1.41 cm  LV e' medial:    2.50 cm/s LVOT diam:     2.00 cm  LV E/e' medial:  25.7 LV SV:         29 ml LV SV Index:   16.53 LVOT Area:     3.14 cm  LEFT ATRIUM             Index LA diam:        3.70 cm 2.18 cm/m LA Vol (A2C):   36.0 ml 21.19 ml/m LA Vol (A4C):   60.7 ml 35.73 ml/m LA Biplane Vol: 45.8 ml 26.96 ml/m  AORTIC VALVE LVOT Vmax:   78.30 cm/s LVOT Vmean:  50.600 cm/s LVOT VTI:    0.169 m  AORTA Ao Root diam: 3.10 cm Ao Asc diam:  3.30 cm MITRAL VALVE MV Area (PHT): 2.48 cm              SHUNTS MV PHT:        88.74 msec            Systemic VTI:  0.17 m MV Decel Time: 306 msec              Systemic Diam: 2.00 cm MV E velocity: 64.30 cm/s  103 cm/s MV A velocity: 123.00 cm/s 70.3 cm/s MV E/A ratio:  0.52        1.5  Mihai Croitoru MD Electronically signed by Sanda Klein MD Signature Date/Time: 05/23/2019/4:55:23 PM    Final         Scheduled Meds: . glycopyrrolate  0.4 mg Intravenous Q4H  . latanoprost  1 drop Both Eyes QHS   Continuous Infusions:    LOS: 2 days     Cordelia Poche, MD [facility] 05/25/2019, 1:41 PM  If 7PM-7AM, please contact night-coverage [email or web address]

## 2019-05-25 NOTE — TOC Initial Note (Signed)
Transition of Care [facility]) - Initial/Assessment Note    Patient Details  Name: Katherine Miranda MRN: [id number] Date of Birth: [birth year]  Transition of Care [facility]) CM/SW Contact:    Pollie Friar, RN Phone Number: 05/25/2019, 10:10 AM  Clinical Narrative:                 TOC consulted for residential hospice at [facility]. CM has reached out to [place] at [facility] for [facility] and they will updated once they have reviewed the chart and spoken with family. They do not have any beds today. TOC following.  Expected Discharge Plan: [place] Barriers to Discharge: Continued Medical Work up   Patient Goals and CMS Choice   CMS Medicare.gov Compare Post Acute Care list provided to:: Patient Represenative (must comment) Choice offered to / list presented to : Adult Children(daughter)  Expected Discharge Plan and Services Expected Discharge Plan: [address]   Discharge Planning Services: CM Consult   Living arrangements for the past 2 months: [place]                                      Prior Living Arrangements/Services Living arrangements for the past 2 months: [place] Lives with:: Facility Resident                   Activities of Daily Living      Permission Sought/Granted                  Emotional Assessment              Admission diagnosis:  Acute encephalopathy [id number] Acute embolic stroke [facility]) [id number] Patient Active Problem List   Diagnosis Date Noted  . Goals of care, counseling/discussion   . Palliative care encounter   . DNR (do not resuscitate) 05/23/2019  . Acute embolic stroke ([place]) [id number]  . Acute encephalopathy 05/22/2019  . Hypertensive urgency 05/22/2019  . Encephalopathy 04/20/2017  . Community acquired pneumonia of right middle lobe of lung 08/28/2016  . CKD (chronic kidney disease), stage III 08/28/2016  . Normocytic anemia 08/28/2016  .  UTI (urinary tract infection) 09/19/2015  . Bilateral pneumonia 09/18/2015  . Delirium 09/18/2015  . Dysphagia 07/17/2015  . UTI (lower urinary tract infection) 07/15/2015  . Hypothyroidism (acquired)   . Symptomatic bradycardia 04/20/2015  . Essential hypertension 09/19/2013  . Atrial fibrillation, chronic ([place]) 01/03/2013  . Subdural hygroma 10/06/2012  . Fall 10/06/2012  . Dementia ([place]) 10/06/2012  . Hyponatremia 10/06/2012  . Dyslipidemia 10/06/2012   PCP:  [name], MD Pharmacy:  No Pharmacies Listed    Social Determinants of Health (SDOH) Interventions    Readmission Risk Interventions No flowsheet data found.

## 2019-05-25 NOTE — Progress Notes (Addendum)
  Palliative Medicine Inpatient Follow Up Note  This NP reviewed the chart  And called patients daughter, [name] to follow up to yesterday's [place]. [name] stated that she had spoken to her family and made the determination with her family to transition to [facility] from here. She was very tearful over the phone. I shared with her that it is not clear if [facility] has any beds presently. We discussed the alternative of patient transitioning back to [facility] where hospice would come in intermittently. [name] stated that thirteen years ago her father went to [facility] and it was a wonderful experience. She realizes that her mother has limited time left and would ideally like for her to go there as well.   Met with [name] at bedside at 1100 she was tearful. We left her mothers bedside to speak in the conference room privately about what to expect moving forward. We dicussed her mothers status and the idea that the interventions we are pursuing will not likely lead to clinical improvement. She asked how much time her mother has left, reiterated that when someone stops eating and drinking a longer estimate is roughly two weeks. She asked about options in terms of where her mother should go, emphasizing her wish for her to go to [facility]. Reccommended starting the comfort process here and transitioning her once a bed becomes available at [facility].   Provided [name] with "Hard Choices for [employer]" booklet.   Coordinated with patients RN, Anderson Malta to verify visitation policy. Provided an update to her on plan as well  Questions and concerns addressed  Recommendations provided to Dr. Lonny Prude  Plan: - Comfort Measures:  - Dilaudid 0.5-60m IV Q1H PRN  - Glycopyrrolate 0.44mIV Q4H  - Ativan 0.5-48m57mIV Q1H PRN  - Tylenol 650m648m/PR PRN  - Zofran 4mg 548mQ6H IV PRN  - Bisacodyl 10mg 82mDay PRN  - Oral Care  - Daily vitals  - Comfort feeds, if able to tolerate   - TOC  referral - [facility] than 50% of the time was spent in counseling and coordination of care  MichelTacey Ruizikki Alda LeaCone HMcGovernTeam Cell Phone: [phone] utilize secure chat with additional questions, if there is no response within 30 minutes please call the above phone number  Palliative Medicine Team providers are available by phone from 7am to 7pm daily and can be reached through the team cell phone.  Should this patient require assistance outside of these hours, please call the patient's attending physician.

## 2019-05-26 DIAGNOSIS — Z66 Do not resuscitate: Secondary | ICD-10-CM

## 2019-05-26 MED ORDER — LORAZEPAM 2 MG/ML IJ SOLN: 1.0000 mg | INTRAMUSCULAR | Status: DC | PRN

## 2019-05-26 MED ORDER — MORPHINE SULFATE (CONCENTRATE) 10 MG/0.5ML PO SOLN: 5.0000 mg | ORAL | Status: DC | PRN

## 2019-05-26 MED ORDER — LORAZEPAM 1 MG PO TABS: 1.0000 mg | ORAL_TABLET | ORAL | Status: DC | PRN

## 2019-05-26 MED ORDER — GLYCOPYRROLATE 0.2 MG/ML IJ SOLN: 0.2000 mg | INTRAMUSCULAR | Status: DC | PRN

## 2019-05-26 MED ORDER — GLYCOPYRROLATE 1 MG PO TABS
1.0000 mg | ORAL_TABLET | ORAL | Status: DC | PRN
  Filled 2019-05-26: qty 1

## 2019-05-26 MED ORDER — LORAZEPAM 2 MG/ML PO CONC: 1.0000 mg | ORAL | Status: DC | PRN

## 2019-05-26 NOTE — TOC Progression Note (Signed)
Transition of Care [facility]) - Progression Note    Patient Details  Name: Katherine Miranda MRN: [id number] Date of Birth: 1920-06-24  Transition of Care [facility]) CM/SW [place], LCSW Phone Number: 05/26/2019, 12:19 PM  Clinical Narrative:    CSW reached out to [facility]. They are not able to accept patient today. CSW to check back tomorrow.    Expected Discharge Plan: [place] Barriers to Discharge: Continued Medical Work up  Expected Discharge Plan and Services Expected Discharge Plan: [place]   Discharge Planning Services: CM Consult   Living arrangements for the past 2 months: [place]                                       Social Determinants of Health (SDOH) Interventions    Readmission Risk Interventions No flowsheet data found.

## 2019-05-26 NOTE — Progress Notes (Signed)
PROGRESS NOTE    Katherine Miranda  [id number] DOB: 07-14-1920 DOA: 05/22/2019 PCP: [name], MD   Brief Narrative: Katherine Miranda is a 85 y.o. female with a Past Medical History of advanced dementia, atrial fibrillation, hypertension, hypothyroidism who presents with altered mental status. She was found to have an acute/subacute stroke with multiple infarcts in addition to AKI. Also concern for possible UTI started on IV Ceftriaxone.   Assessment & Plan:   Principal Problem:   Acute encephalopathy Active Problems:   Dementia (HCC)   Atrial fibrillation, chronic (HCC)   Hypothyroidism (acquired)   UTI (urinary tract infection)   Hypertensive urgency   DNR (do not resuscitate)   Acute embolic stroke ([place])   Goals of care, counseling/discussion   Palliative care encounter   Comfort measures only status   Acute encephalopathy Possibly related to UTI but according to daughter, she has had this for weeks with multiple trials at treatment and has not improved. Currently unchanged. Patient also with multiple acute/subacute infarcts and concern for possible seizures secondary to abnormal EEG. Long discussion with daughter this afternoon with regard to goals of care. Will let Katherine Miranda declare herself over the next day. Hopefully she will improve enough to where she will be safe enough to eat by mouth. Discussed possible transition to comfort care if things do not improved. Palliative on board and after discussion with family, patient now made comfort care.  Acute stroke MRI significant for acute/subacute infarcts of multiple vascular territories concerning for embolic stroke with greatest involvement of left ACA territory. Transthoracic Echocardiogram significant for no emboli source. Hemoglobin A1C of 6.3%, LDL of 121. Comfort care.  AKI Secondary to decreased oral intake. Started on IV fluids. Creatinine of 1.57 on admission and currently back to baseline. AKI  resolved.  UTI As mentioned above, multiple attempts at treatment. Urine culture significant for multiple species.  Essential hypertension Managed with IV hydralazine as needed but is now comfort care.  Hypothyroidism Synthroid IV continued until patient transitioned to comfort care.  History of atrial fibrillation Currently in sinus rhythm. On Eliquis and diltiazem as an outpatient. Heparin drip used while inpatient and discontinued once patient made comfort care.  Dementia Mental status worsened as mentioned above. Baseline function is poor but she was eating and was wheelchair bound. She was also verbal and alert.  Comfort measures -Morphine, Ativan, Robinul -Awaiting [facility]    DVT prophylaxis: Heparin drip Code Status:   Code Status: DNR Family Communication: Daughter at bedside Disposition Plan: Discharge to [facility] when bed available   Consultants:   Neurology  Palliative care  Procedures:   12/22: Transthoracic Echocardiogram IMPRESSIONS    1. Left ventricular ejection fraction, by visual estimation, is 60 to 65%. The left ventricle has normal function. There is moderately increased left ventricular hypertrophy.  2. Left ventricular diastolic function could not be evaluated.  3. The left ventricle has no regional wall motion abnormalities.  4. Global right ventricle has normal systolic function.The right ventricular size is normal. No increase in right ventricular wall thickness.  5. Left atrial size was mildly dilated.  6. Right atrial size was normal.  7. Small pericardial effusion.  8. The pericardial effusion is circumferential.  9. The mitral valve is normal in structure. No evidence of mitral valve regurgitation. 10. The tricuspid valve is normal in structure. Tricuspid valve regurgitation is not demonstrated. 11. The aortic valve is normal in structure. Aortic valve regurgitation is not visualized. 12. The pulmonic  valve was grossly  normal. Pulmonic valve regurgitation is not visualized.   12/22: EEG DESCRIPTION: EEG showed continuous generalized 3-5Hz  theta-delta slowing admixed with 13-15hz  generalized bet activity, maximal frontocentral. Spikes were seen frequently in left occipita region, maximal O1. Hyperventilation and photic stimulation were not performed.  ABNORMALITY - Continuous slow, generalized - Spikes, left occipital  IMPRESSION: This study showed evidence of potential epileptogenicity in left occipital region. Additionally there is evidence of severe diffuse encephalopathy, non specific to etiology. No seizures were seen throughout the recording.  Antimicrobials:  Ceftriaxone    Subjective: Mental status prohibits history  Objective: Vitals:   05/25/19 2010 05/25/19 2348 05/26/19 0439 05/26/19 0828  BP: (!) 140/57 (!) 177/113 (!) 151/95 (!) 148/98  Pulse: 69 87 91 83  Resp: 18 18  20   Temp: 97.8 F (36.6 C) 97.7 F (36.5 C) 98.2 F (36.8 C) 98.2 F (36.8 C)  TempSrc: Oral Axillary Axillary Axillary  SpO2: 100% 100% 100% 97%  Weight:        Intake/Output Summary (Last 24 hours) at 05/26/2019 1124 Last data filed at 05/25/2019 1400 Gross per 24 hour  Intake 194.97 ml  Output --  Net 194.97 ml   Filed Weights   05/24/19 1000  Weight: 58.1 kg    Examination:  General: Well appearing, no distress    Data Reviewed: I have personally reviewed following labs and imaging studies  CBC: Recent Labs  Lab 05/22/19 1652 05/22/19 1751 05/23/19 0152 05/25/19 0138  WBC 9.8  --  10.5 7.0  NEUTROABS 7.6  --   --   --   HGB 14.0 13.9 13.1 12.0  HCT 43.7 41.0 39.1 37.4  MCV 91.0  --  89.3 91.2  PLT 234  --  198 [id number]   Basic Metabolic Panel: Recent Labs  Lab 05/22/19 1652 05/22/19 1751 05/23/19 0152 05/23/19 0620 05/24/19 0745  NA 139 139 142 143 144  K 4.9 4.3 4.3 4.4 3.4*  CL 105  --  106 106 111  CO2 23  --  23 24 24   GLUCOSE 142*  --  134* 133* 119*  BUN 29*  --   28* 26* 18  CREATININE 1.57*  --  1.22* 1.17* 0.86  CALCIUM 9.2  --  8.9 8.9 8.8*   GFR: CrCl cannot be calculated (Unknown ideal weight.). Liver Function Tests: Recent Labs  Lab 05/22/19 1652  AST 28  ALT 13  ALKPHOS 110  BILITOT 1.2  PROT 6.8  ALBUMIN 3.4*   No results for input(s): LIPASE, AMYLASE in the last 168 hours. Recent Labs  Lab 05/22/19 1652 05/23/19 0152  AMMONIA 24 15   Coagulation Profile: No results for input(s): INR, PROTIME in the last 168 hours. Cardiac Enzymes: No results for input(s): CKTOTAL, CKMB, CKMBINDEX, TROPONINI in the last 168 hours. BNP (last 3 results) No results for input(s): PROBNP in the last 8760 hours. HbA1C: Recent Labs    05/24/19 0745  HGBA1C 6.3*   CBG: Recent Labs  Lab 05/23/19 0658 05/23/19 1139 05/23/19 1658 05/24/19 0621 05/25/19 0635  GLUCAP 111* 126* 119* 132* 97   Lipid Profile: Recent Labs    05/24/19 0745  CHOL 193  HDL 44  LDLCALC 121*  TRIG 140  CHOLHDL 4.4   Thyroid Function Tests: No results for input(s): TSH, T4TOTAL, FREET4, T3FREE, THYROIDAB in the last 72 hours. Anemia Panel: No results for input(s): VITAMINB12, FOLATE, FERRITIN, TIBC, IRON, RETICCTPCT in the last 72 hours. Sepsis Labs: Recent Labs  Lab  05/22/19 1652  LATICACIDVEN 1.7    Recent Results (from the past 240 hour(s))  Culture, Urine     Status: Abnormal   Collection Time: 05/22/19  6:19 PM   Specimen: Urine, Random  Result Value Ref Range Status   Specimen Description URINE, RANDOM  Final   Special Requests   Final    NONE Performed at [facility], [place] [address], [place], [place] [address]    Culture MULTIPLE SPECIES PRESENT, SUGGEST RECOLLECTION (A)  Final   Report Status 05/24/2019 FINAL  Final  SARS CORONAVIRUS 2 (TAT 6-24 HRS) Nasopharyngeal Nasopharyngeal Swab     Status: None   Collection Time: 05/22/19  7:46 PM   Specimen: Nasopharyngeal Swab  Result Value Ref Range Status   SARS Coronavirus 2  NEGATIVE NEGATIVE Final    Comment: (NOTE) SARS-CoV-2 target nucleic acids are NOT DETECTED. The SARS-CoV-2 RNA is generally detectable in upper and lower respiratory specimens during the acute phase of infection. Negative results do not preclude SARS-CoV-2 infection, do not rule out co-infections with other pathogens, and should not be used as the sole basis for treatment or other patient management decisions. Negative results must be combined with clinical observations, patient history, and epidemiological information. The expected result is Negative. Fact Sheet for Patients: SugarRoll.be Fact Sheet for Healthcare Providers: https://www.woods-mathews.com/ This test is not yet approved or cleared by the [place] FDA and  has been authorized for detection and/or diagnosis of SARS-CoV-2 by FDA under an Emergency Use Authorization (EUA). This EUA will remain  in effect (meaning this test can be used) for the duration of the COVID-19 declaration under Section 56 4(b)(1) of the Act, 21 U.S.C. section 360bbb-3(b)(1), unless the authorization is terminated or revoked sooner. Performed at [facility], [place] [address], [place], [place] [address]          Radiology Studies: No results found.      Scheduled Meds: . latanoprost  1 drop Both Eyes QHS   Continuous Infusions:    LOS: 3 days     Cordelia Poche, MD [facility] 05/26/2019, 11:24 AM  If 7PM-7AM, please contact night-coverage [email or web address]

## 2019-05-26 NOTE — Progress Notes (Signed)
Daughter requested yellow color ring band to be cut off her mother d/t hands being swollen. Darrell EMT from ED came up with ring cutter and cut ring off without problems. Ring given to her daughter [name]. Rolled up washed cloths pl;adce ion each of patients hands to help with patient tightly squeezing her hands. Mouth care completed and moisturizer applied to lips. Noted dressing on upper right shoulder, below right elbow and side of right knee.

## 2019-05-26 NOTE — Progress Notes (Signed)
  Palliative Medicine Inpatient Follow Up Note  This NP reviewed the chart. Patient appears to have been kept comfortable overnight. Has not required any PRNs for symptom management. Remains to be somnolent. Minimal response. Awaiting a bed at [facility].  Called [name] this morning who was feeling more "at peace." She stated that she her brother and miss Tuma's grandson had a wonderful visit yesterday. They shared stories of the past. She was very thankful to the medical teams for all of their efforts to help the patients transition instilling dignity.  Therapeutic Listening and emotional support were provided.  Left "Gone From My Sight" booklet at bedside.  Questions and concerns addressed  Recommendations provided to Dr. Lonny Prude  Plan: - Comfort Measures:  - Dilaudid 0.5-1mg  IV Q1H PRN  - Glycopyrrolate 0.4mg  IV Q4H  - Ativan 0.5-1mg   IV Q1H PRN  - Tylenol 650mg  PO/PR PRN  - Zofran 4mg  IV Q6H IV PRN  - Bisacodyl 10mg  PR QDay PRN  - Oral Care  - Daily vitals  - Comfort feeds, if able to tolerate   - Awaiting bed at [facility]  Time Spent: 25 Greater than 50% of the time was spent in counseling and coordination of care  Tacey Ruiz, NP Alda Lea, NP  [place] Team Team Cell Phone: [phone]  Please utilize secure chat with additional questions, if there is no response within 30 minutes please call the above phone number  Palliative Medicine Team providers are available by phone from 7am to 7pm daily and can be reached through the team cell phone.  Should this patient require assistance outside of these hours, please call the patient's attending physician.

## 2019-05-27 NOTE — Progress Notes (Signed)
[facility] Documentation   Pt has been approved for [facility] transfer and all paperwork has been completed. [facility] does have a bed available for pt today. Please arrange transport.   Please call [facility] at [phone] to give charge nurse report and fax discharge summary to [phone].   Please dc any lines. May leave catheter in place if pt has one. Please send pt to [facility] with DNR paperwork.    Please call with any questions.    Thank you,  Freddie Breech, RN [facility] Liaison [phone]

## 2019-05-27 NOTE — Discharge Summary (Signed)
Physician Discharge Summary  HULA TORNETTA [id number] DOB: [birth year] DOA: 05/22/2019  PCP: [name], MD  Admit date: 05/22/2019 Discharge date: 05/27/2019  Admitted From: SNF Disposition: Hospice   Discharge Condition: Hospice CODE STATUS: DNR Diet recommendation: Comfort   Brief/Interim Summary:  Admission HPI written by Gean Birchwood, MD  Chief Complaint: Less responsive.  History obtained from patient's daughter.  Patient has dementia.  HPI: Katherine Miranda is a 85 y.o. female with history of advanced dementia, atrial fibrillation, hypertension, hypothyroidism was brought to the ER after patient was found to be less responsive at the nursing facility since this afternoon.  As per patient's daughter who provided the history patient has been more lethargic over the last 1 month and was treated for urinary tract infection twice.  Yesterday patient became mostly lethargic and was brought to the ER.  No seizure-like activities no nausea vomiting diarrhea chest pain fever chills or shortness of breath.  ED Course: In the ER patient was afebrile UA is consistent with UTI CT head unremarkable EKG shows normal sinus rhythm chest x-ray unremarkable.  Labs reveal elevated creatinine at 1.5 baseline was around 0.8 in 2018.  Ammonia 24.  Patient was started on ceftriaxone for UTI.  At the time of my exam patient response to painful stimuli.  Pupils are reacting to light.  I examined the patient along with Dr. Cheral Marker neurologist.   Hospital course:  Acute encephalopathy Possibly related to UTI but according to daughter, she has had this for weeks with multiple trials at treatment and has not improved. Currently unchanged. Patient also with multiple acute/subacute infarcts and concern for possible seizures secondary to abnormal EEG. Palliative on board and after discussion with family, patient now made comfort care.  Acute stroke MRI significant for acute/subacute  infarcts of multiple vascular territories concerning for embolic stroke with greatest involvement of left ACA territory. Transthoracic Echocardiogram significant for no emboli source. Hemoglobin A1C of 6.3%, LDL of 121. Comfort care.  AKI Secondary to decreased oral intake. Started on IV fluids. Creatinine of 1.57 on admission and currently back to baseline. AKI resolved.  UTI As mentioned above, multiple attempts at treatment. Urine culture significant for multiple species.  Essential hypertension Managed with IV hydralazine as needed but is now comfort care.  Hypothyroidism Synthroid IV continued until patient transitioned to comfort care.  History of atrial fibrillation Currently in sinus rhythm. On Eliquis and diltiazem as an outpatient. Heparin drip used while inpatient and discontinued once patient made comfort care.  Dementia Mental status worsened as mentioned above. Baseline function is poor but she was eating and was wheelchair bound. She was also verbal and alert. Comfort care  Comfort measures Discharge to [facility]    Discharge Diagnoses:  Principal Problem:   Acute encephalopathy Active Problems:   Dementia ([place])   Atrial fibrillation, chronic (HCC)   Hypothyroidism (acquired)   UTI (urinary tract infection)   Hypertensive urgency   DNR (do not resuscitate)   Acute embolic stroke ([place])   Goals of care, counseling/discussion   Palliative care encounter   Comfort measures only status    Discharge Instructions   Allergies as of 05/27/2019   No Known Allergies     Medication List    STOP taking these medications   acetaminophen 500 MG tablet Commonly known as: TYLENOL   ascorbic acid 500 MG tablet Commonly known as: VITAMIN C   b complex vitamins tablet   Cartia XT 120 MG 24 hr capsule  Generic drug: diltiazem   cholecalciferol 1000 units tablet Commonly known as: VITAMIN D   cloNIDine 0.1 MG tablet Commonly known as: CATAPRES    Eliquis 5 MG Tabs tablet Generic drug: apixaban   ivermectin 3 MG Tabs tablet Commonly known as: STROMECTOL   levothyroxine 50 MCG tablet Commonly known as: SYNTHROID   lisinopril 10 MG tablet Commonly known as: ZESTRIL   loratadine 10 MG tablet Commonly known as: CLARITIN   nitroGLYCERIN 0.4 MG SL tablet Commonly known as: NITROSTAT   Risa-Bid Probiotic Tabs   senna 8.6 MG Tabs tablet Commonly known as: SENOKOT   Travoprost (BAK Free) 0.004 % Soln ophthalmic solution Commonly known as: TRAVATAN   Zinc 50 MG Tabs       No Known Allergies  Consultations:  Neurology  Palliative care medicine   Procedures/Studies: EEG  Result Date: 05/23/2019 Lora Havens, MD     05/23/2019  6:36 PM Patient Name: Katherine Miranda MRN: [id number] Epilepsy Attending: Lora Havens Referring Physician/Provider: Dr Gean Birchwood Date: 05/23/2019 Duration: 24.11 mins Patient history: 85yo F with ams. EEG to evaluate for seizure. Level of alertness: comatose AEDs during EEG study: None Technical aspects: This EEG study was done with scalp electrodes positioned according to the 10-20 International system of electrode placement. Electrical activity was acquired at a sampling rate of 500Hz  and reviewed with a high frequency filter of 70Hz  and a low frequency filter of 1Hz . EEG data were recorded continuously and digitally stored. DESCRIPTION: EEG showed continuous generalized 3-5Hz  theta-delta slowing admixed with 13-15hz  generalized bet activity, maximal frontocentral. Spikes were seen frequently in left occipita region, maximal O1. Hyperventilation and photic stimulation were not performed. ABNORMALITY - Continuous slow, generalized - Spikes, left occipital IMPRESSION: This study showed evidence of potential epileptogenicity in left occipital region. Additionally there is evidence of severe diffuse encephalopathy, non specific to etiology. No seizures were seen throughout the recording.  Lora Havens   DG Abd 1 View  Result Date: 05/23/2019 CLINICAL DATA:  Acute encephalopathy EXAM: ABDOMEN - 1 VIEW COMPARISON:  None. FINDINGS: The bowel gas pattern is normal. There is a moderate to large amount of rectal stool. No radio-opaque calculi or other significant radiographic abnormality are seen. Overlying spinal fixation hardware seen. IMPRESSION: Nonobstructive bowel gas pattern. Moderate to large amount of rectal stool. Electronically Signed   By: Prudencio Pair M.D.   On: 05/23/2019 00:20   CT HEAD WO CONTRAST  Result Date: 05/22/2019 CLINICAL DATA:  Rule out cerebral hemorrhage EXAM: CT HEAD WITHOUT CONTRAST TECHNIQUE: Contiguous axial images were obtained from the base of the skull through the vertex without intravenous contrast. COMPARISON:  CT head 04/20/2017 FINDINGS: Brain: Negative for acute hemorrhage. Moderate to advanced atrophy with ventricular enlargement which is stable. Advanced chronic microvascular ischemic changes throughout the white matter. Chronic infarct right inferior occipital lobe which has developed since the prior study. Negative for acute infarct or mass. Vascular: Negative for hyperdense vessel Skull: Negative Sinuses/Orbits: Paranasal sinuses clear. Bilateral cataract surgery. No orbital lesion. Other: None IMPRESSION: Moderate to advanced atrophy. Advanced chronic microvascular ischemic change in the white matter. Chronic infarct right inferior occipital lobe. Negative for acute infarct or hemorrhage. Electronically Signed   By: Franchot Gallo M.D.   On: 05/22/2019 17:29   MR BRAIN WO CONTRAST  Result Date: 05/23/2019 CLINICAL DATA:  Acute encephalopathy EXAM: MRI HEAD WITHOUT CONTRAST TECHNIQUE: Multiplanar, multiecho pulse sequences of the brain and surrounding structures were obtained without intravenous contrast. COMPARISON:  None. FINDINGS: Brain: There is variable reduced diffusion in the parasagittal left frontoparietal lobes as well as the left  pons and parasagittal right occipital lobe. There is a chronic right occipital infarct. Additional small chronic infarct of the left caudate. Additional confluent T2 hyperintensity in the supratentorial and pontine white matter likely reflects advanced chronic microvascular ischemic changes. Prominence of the ventricles and sulci reflects generalized parenchymal volume loss. There is disproportionate prominence of the ventricles likely reflecting central volume loss rather than hydrocephalus. There is no intracranial hemorrhage. There is no intracranial mass or significant mass effect. Vascular: Major vessel flow voids at the skull base are preserved. Skull and upper cervical spine: Normal marrow signal is preserved. Sinuses/Orbits: Mild mucosal thickening.  Orbits are unremarkable. Other: Sella is unremarkable.  Mastoid air cells are clear. IMPRESSION: Acute to subacute infarcts involving different vascular territories suggesting central embolic source. Greatest involvement is of the left ACA territory. Advanced chronic microvascular ischemic changes, chronic infarcts, and parenchymal volume loss as described above. Electronically Signed   By: Macy Mis M.D.   On: 05/23/2019 11:35   DG Chest Port 1 View  Result Date: 05/22/2019 CLINICAL DATA:  Altered mental status. EXAM: PORTABLE CHEST 1 VIEW COMPARISON:  04/20/2017. FINDINGS: Mediastinum and hilar structures normal. Heart size normal. No focal infiltrate. No pleural effusion or pneumothorax. Interposition of the colon right hemidiaphragm again noted. Thoracic spine scoliosis concave left. Diffuse degenerative change. IMPRESSION: No acute cardiopulmonary disease. Electronically Signed   By: Marcello Moores  Register   On: 05/22/2019 17:17   ECHOCARDIOGRAM COMPLETE  Result Date: 05/23/2019   ECHOCARDIOGRAM REPORT   Patient Name:   RYNLEE COSMAN Date of Exam: 05/23/2019 Medical Rec #:  [id number]        Height:       62.0 in Accession #:    [id number]        Weight:       151.5 lb Date of Birth:  June 21, 1920       BSA:          1.70 m Patient Age:    25 years         BP:           124/78 mmHg Patient Gender: F                HR:           59 bpm. Exam Location:  Inpatient Procedure: 2D Echo Indications:    Stroke 434.91 / I163.9  History:        Patient has no prior history of Echocardiogram examinations.                 Arrythmias:Atrial Fibrillation; Risk Factors:Hypertension.                 Dementia. decreased responsiveness.  Sonographer:    Darlina Sicilian RDCS Referring Phys: [place]  1. Left ventricular ejection fraction, by visual estimation, is 60 to 65%. The left ventricle has normal function. There is moderately increased left ventricular hypertrophy.  2. Left ventricular diastolic function could not be evaluated.  3. The left ventricle has no regional wall motion abnormalities.  4. Global right ventricle has normal systolic function.The right ventricular size is normal. No increase in right ventricular wall thickness.  5. Left atrial size was mildly dilated.  6. Right atrial size was normal.  7. Small pericardial effusion.  8. The pericardial effusion is circumferential.  9. The mitral valve is  normal in structure. No evidence of mitral valve regurgitation. 10. The tricuspid valve is normal in structure. Tricuspid valve regurgitation is not demonstrated. 11. The aortic valve is normal in structure. Aortic valve regurgitation is not visualized. 12. The pulmonic valve was grossly normal. Pulmonic valve regurgitation is not visualized. FINDINGS  Left Ventricle: Left ventricular ejection fraction, by visual estimation, is 60 to 65%. The left ventricle has normal function. The left ventricle has no regional wall motion abnormalities. The left ventricular internal cavity size was the left ventricle is normal in size. There is moderately increased left ventricular hypertrophy. Concentric left ventricular hypertrophy. The left ventricular  diastology could not be evaluated due to atrial fibrillation. Left ventricular diastolic function could  not be evaluated. Right Ventricle: The right ventricular size is normal. No increase in right ventricular wall thickness. Global RV systolic function is has normal systolic function. Left Atrium: Left atrial size was mildly dilated. Right Atrium: Right atrial size was normal in size Pericardium: A small pericardial effusion is present. The pericardial effusion is circumferential. Mitral Valve: The mitral valve is normal in structure. No evidence of mitral valve regurgitation. Tricuspid Valve: The tricuspid valve is normal in structure. Tricuspid valve regurgitation is not demonstrated. Aortic Valve: The aortic valve is normal in structure. Aortic valve regurgitation is not visualized. Pulmonic Valve: The pulmonic valve was grossly normal. Pulmonic valve regurgitation is not visualized. Pulmonic regurgitation is not visualized. Aorta: The aortic root and ascending aorta are structurally normal, with no evidence of dilitation. IAS/Shunts: No atrial level shunt detected by color flow Doppler.  LEFT VENTRICLE PLAX 2D LVIDd:         3.31 cm  Diastology LVIDs:         2.16 cm  LV e' lateral:   2.94 cm/s LV PW:         1.38 cm  LV E/e' lateral: 21.9 LV IVS:        1.41 cm  LV e' medial:    2.50 cm/s LVOT diam:     2.00 cm  LV E/e' medial:  25.7 LV SV:         29 ml LV SV Index:   16.53 LVOT Area:     3.14 cm  LEFT ATRIUM             Index LA diam:        3.70 cm 2.18 cm/m LA Vol (A2C):   36.0 ml 21.19 ml/m LA Vol (A4C):   60.7 ml 35.73 ml/m LA Biplane Vol: 45.8 ml 26.96 ml/m  AORTIC VALVE LVOT Vmax:   78.30 cm/s LVOT Vmean:  50.600 cm/s LVOT VTI:    0.169 m  AORTA Ao Root diam: 3.10 cm Ao Asc diam:  3.30 cm MITRAL VALVE MV Area (PHT): 2.48 cm              SHUNTS MV PHT:        88.74 msec            Systemic VTI:  0.17 m MV Decel Time: 306 msec              Systemic Diam: 2.00 cm MV E velocity: 64.30 cm/s  103 cm/s  MV A velocity: 123.00 cm/s 70.3 cm/s MV E/A ratio:  0.52        1.5  Mihai Croitoru MD Electronically signed by Sanda Klein MD Signature Date/Time: 05/23/2019/4:55:23 PM    Final       12/22: Transthoracic Echocardiogram IMPRESSIONS  1. Left ventricular ejection fraction, by visual estimation, is 60 to 65%. The left ventricle has normal function. There is moderately increased left ventricular hypertrophy. 2. Left ventricular diastolic function could not be evaluated. 3. The left ventricle has no regional wall motion abnormalities. 4. Global right ventricle has normal systolic function.The right ventricular size is normal. No increase in right ventricular wall thickness. 5. Left atrial size was mildly dilated. 6. Right atrial size was normal. 7. Small pericardial effusion. 8. The pericardial effusion is circumferential. 9. The mitral valve is normal in structure. No evidence of mitral valve regurgitation. 10. The tricuspid valve is normal in structure. Tricuspid valve regurgitation is not demonstrated. 11. The aortic valve is normal in structure. Aortic valve regurgitation is not visualized. 12. The pulmonic valve was grossly normal. Pulmonic valve regurgitation is not visualized.   12/22: EEG DESCRIPTION:EEG showed continuous generalized 3-5Hz  theta-delta slowingadmixed with 13-15hz  generalized bet activity, maximal frontocentral.Spikes were seen frequently in left occipita region, maximal O1. Hyperventilation and photic stimulation were not performed.  ABNORMALITY - Continuous slow, generalized - Spikes, left occipital  IMPRESSION: This studyshowed evidence of potential epileptogenicity in left occipital region. Additionally there is evidence of severe diffuse encephalopathy, non specific to etiology.No seizures were seen throughout the recording.   Subjective: Mental status prohibits history  Discharge Exam: Vitals:   05/27/19 0402 05/27/19 0739  BP:  (!) 159/88 (!) 146/85  Pulse: 65 63  Resp: 20 16  Temp: 97.7 F (36.5 C) 98.3 F (36.8 C)  SpO2: 94% 98%   Vitals:   05/26/19 2011 05/26/19 2342 05/27/19 0402 05/27/19 0739  BP: (!) 138/94 (!) 149/80 (!) 159/88 (!) 146/85  Pulse: 74 67 65 63  Resp: 20 20 20 16   Temp: 99.1 F (37.3 C) 98.4 F (36.9 C) 97.7 F (36.5 C) 98.3 F (36.8 C)  TempSrc: Axillary Oral Oral Oral  SpO2: 96% 99% 94% 98%  Weight:        General: Pt is asleep, not in acute distress Skin: skin tears and ecchymoses    The results of significant diagnostics from this hospitalization (including imaging, microbiology, ancillary and laboratory) are listed below for reference.     Microbiology: Recent Results (from the past 240 hour(s))  Culture, Urine     Status: Abnormal   Collection Time: 05/22/19  6:19 PM   Specimen: Urine, Random  Result Value Ref Range Status   Specimen Description URINE, RANDOM  Final   Special Requests   Final    NONE Performed at [facility], [address], [place], [place] [address]    Culture MULTIPLE SPECIES PRESENT, SUGGEST RECOLLECTION (A)  Final   Report Status 05/24/2019 FINAL  Final  SARS CORONAVIRUS 2 (TAT 6-24 HRS) Nasopharyngeal Nasopharyngeal Swab     Status: None   Collection Time: 05/22/19  7:46 PM   Specimen: Nasopharyngeal Swab  Result Value Ref Range Status   SARS Coronavirus 2 NEGATIVE NEGATIVE Final    Comment: (NOTE) SARS-CoV-2 target nucleic acids are NOT DETECTED. The SARS-CoV-2 RNA is generally detectable in upper and lower respiratory specimens during the acute phase of infection. Negative results do not preclude SARS-CoV-2 infection, do not rule out co-infections with other pathogens, and should not be used as the sole basis for treatment or other patient management decisions. Negative results must be combined with clinical observations, patient history, and epidemiological information. The expected result is Negative. Fact Sheet for  Patients: SugarRoll.be Fact Sheet for Healthcare Providers: https://www.woods-mathews.com/ This test is  not yet approved or cleared by the [place] and  has been authorized for detection and/or diagnosis of SARS-CoV-2 by FDA under an Emergency Use Authorization (EUA). This EUA will remain  in effect (meaning this test can be used) for the duration of the COVID-19 declaration under Section 56 4(b)(1) of the Act, 21 U.S.C. section 360bbb-3(b)(1), unless the authorization is terminated or revoked sooner. Performed at [facility], [place] [address] [place], [place] [address]      Labs: BNP (last 3 results) No results for input(s): BNP in the last 8760 hours. Basic Metabolic Panel: Recent Labs  Lab 05/22/19 1652 05/22/19 1751 05/23/19 0152 05/23/19 0620 05/24/19 0745  NA 139 139 142 143 144  K 4.9 4.3 4.3 4.4 3.4*  CL 105  --  106 106 111  CO2 23  --  23 24 24   GLUCOSE 142*  --  134* 133* 119*  BUN 29*  --  28* 26* 18  CREATININE 1.57*  --  1.22* 1.17* 0.86  CALCIUM 9.2  --  8.9 8.9 8.8*   Liver Function Tests: Recent Labs  Lab 05/22/19 1652  AST 28  ALT 13  ALKPHOS 110  BILITOT 1.2  PROT 6.8  ALBUMIN 3.4*   No results for input(s): LIPASE, AMYLASE in the last 168 hours. Recent Labs  Lab 05/22/19 1652 05/23/19 0152  AMMONIA 24 15   CBC: Recent Labs  Lab 05/22/19 1652 05/22/19 1751 05/23/19 0152 05/25/19 0138  WBC 9.8  --  10.5 7.0  NEUTROABS 7.6  --   --   --   HGB 14.0 13.9 13.1 12.0  HCT 43.7 41.0 39.1 37.4  MCV 91.0  --  89.3 91.2  PLT 234  --  198 217   Cardiac Enzymes: No results for input(s): CKTOTAL, CKMB, CKMBINDEX, TROPONINI in the last 168 hours. BNP: Invalid input(s): POCBNP CBG: Recent Labs  Lab 05/23/19 0658 05/23/19 1139 05/23/19 1658 05/24/19 0621 05/25/19 0635  GLUCAP 111* 126* 119* 132* 97   D-Dimer No results for input(s): DDIMER in the last 72 hours. Hgb A1c No  results for input(s): HGBA1C in the last 72 hours. Lipid Profile No results for input(s): CHOL, HDL, LDLCALC, TRIG, CHOLHDL, LDLDIRECT in the last 72 hours. Thyroid function studies No results for input(s): TSH, T4TOTAL, T3FREE, THYROIDAB in the last 72 hours.  Invalid input(s): FREET3 Anemia work up No results for input(s): VITAMINB12, FOLATE, FERRITIN, TIBC, IRON, RETICCTPCT in the last 72 hours. Urinalysis    Component Value Date/Time   COLORURINE YELLOW 05/22/2019 1819   APPEARANCEUR TURBID (A) 05/22/2019 1819   LABSPEC 1.019 05/22/2019 1819   PHURINE 5.0 05/22/2019 1819   GLUCOSEU NEGATIVE 05/22/2019 1819   HGBUR NEGATIVE 05/22/2019 1819   BILIRUBINUR NEGATIVE 05/22/2019 1819   KETONESUR 5 (A) 05/22/2019 1819   PROTEINUR 100 (A) 05/22/2019 1819   UROBILINOGEN 0.2 04/15/2015 1509   NITRITE NEGATIVE 05/22/2019 1819   LEUKOCYTESUR MODERATE (A) 05/22/2019 1819   Sepsis Labs Invalid input(s): PROCALCITONIN,  WBC,  LACTICIDVEN Microbiology Recent Results (from the past 240 hour(s))  Culture, Urine     Status: Abnormal   Collection Time: 05/22/19  6:19 PM   Specimen: Urine, Random  Result Value Ref Range Status   Specimen Description URINE, RANDOM  Final   Special Requests   Final    NONE Performed at [facility], [address] [place], [place] [address]    Culture MULTIPLE SPECIES PRESENT, SUGGEST RECOLLECTION (A)  Final   Report Status 05/24/2019 FINAL  Final  SARS CORONAVIRUS 2 (TAT 6-24 HRS) Nasopharyngeal Nasopharyngeal Swab     Status: None   Collection Time: 05/22/19  7:46 PM   Specimen: Nasopharyngeal Swab  Result Value Ref Range Status   SARS Coronavirus 2 NEGATIVE NEGATIVE Final    Comment: (NOTE) SARS-CoV-2 target nucleic acids are NOT DETECTED. The SARS-CoV-2 RNA is generally detectable in upper and lower respiratory specimens during the acute phase of infection. Negative results do not preclude SARS-CoV-2 infection, do not rule out co-infections  with other pathogens, and should not be used as the sole basis for treatment or other patient management decisions. Negative results must be combined with clinical observations, patient history, and epidemiological information. The expected result is Negative. Fact Sheet for Patients: SugarRoll.be Fact Sheet for Healthcare Providers: https://www.woods-mathews.com/ This test is not yet approved or cleared by the [place] FDA and  has been authorized for detection and/or diagnosis of SARS-CoV-2 by FDA under an Emergency Use Authorization (EUA). This EUA will remain  in effect (meaning this test can be used) for the duration of the COVID-19 declaration under Section 56 4(b)(1) of the Act, 21 U.S.C. section 360bbb-3(b)(1), unless the authorization is terminated or revoked sooner. Performed at [facility], [place] [address], [place], [place] [address]      Time coordinating discharge: 35 minutes  SIGNED:   Cordelia Poche, MD [facility] 05/27/2019, 10:02 AM

## 2019-05-27 NOTE — TOC Transition Note (Signed)
Transition of Care [facility]) - CM/SW Discharge Note   Patient Details  Name: Katherine Miranda MRN: [id number] Date of Birth: 1920/08/09  [facility] CM/SW Contact:  Katherine Miranda, [place] Phone Number: 05/27/2019, 12:56 PM   Clinical Narrative:     Patient will DC to: [facility]  Anticipated DC date: 05/27/2019 Family notified: Yes Transport by: Katherine Miranda   Per MD patient ready for DC to . RN, patient, patient's family, and facility notified of DC. Discharge Summary and FL2 sent to facility. RN to call report prior to discharge [phone]). DC packet on chart. Ambulance transport requested for patient.   CSW will sign off for now as social work intervention is no longer needed. Please consult [place] again if new needs arise.  Katherine Leppert, LCSW-A [place]/Clinical Social Work Department Cell: [phone]      Barriers to Discharge: No Barriers Identified   Patient Goals and CMS Choice Patient states their goals for this hospitalization and ongoing recovery are:: Pt is transitioning to comfort care CMS Medicare.gov Compare Post Acute Care list provided to:: Patient Represenative (must comment) Choice offered to / list presented to : Adult Children  Discharge Placement                       Discharge Plan and Services In-house Referral: Clinical Social Work Discharge Planning Services: CM Consult Post Acute Care Choice: Hospice                               Social Determinants of Health (SDOH) Interventions     Readmission Risk Interventions No flowsheet data found.

## 2019-07-03 DEATH — deceased
